# Patient Record
Sex: Female | Born: 1960
Health system: Southern US, Community
[De-identification: ages and names within clinical notes are randomized; demographics above are authoritative.]

## PROBLEM LIST (undated history)

## (undated) DIAGNOSIS — C50919 Malignant neoplasm of unspecified site of unspecified female breast: Secondary | ICD-10-CM

## (undated) DIAGNOSIS — T7840XA Allergy, unspecified, initial encounter: Secondary | ICD-10-CM

## (undated) DIAGNOSIS — D649 Anemia, unspecified: Secondary | ICD-10-CM

## (undated) DIAGNOSIS — Z8669 Personal history of other diseases of the nervous system and sense organs: Secondary | ICD-10-CM

## (undated) DIAGNOSIS — Z923 Personal history of irradiation: Secondary | ICD-10-CM

## (undated) HISTORY — PX: BREAST CYST ASPIRATION: SHX578

## (undated) HISTORY — PX: TUBAL LIGATION: SHX77

## (undated) HISTORY — PX: BREAST SURGERY: SHX581

## (undated) HISTORY — DX: Allergy, unspecified, initial encounter: T78.40XA

## (undated) HISTORY — PX: POLYPECTOMY: SHX149

## (undated) HISTORY — DX: Personal history of other diseases of the nervous system and sense organs: Z86.69

## (undated) HISTORY — PX: COLONOSCOPY: SHX174

## (undated) HISTORY — DX: Anemia, unspecified: D64.9

---

## 1997-09-09 ENCOUNTER — Emergency Department (HOSPITAL_COMMUNITY): Admission: EM | Admit: 1997-09-09 | Discharge: 1997-09-09 | Payer: Self-pay | Admitting: Emergency Medicine

## 1998-07-19 ENCOUNTER — Other Ambulatory Visit: Admission: RE | Admit: 1998-07-19 | Discharge: 1998-07-19 | Payer: Self-pay | Admitting: Obstetrics and Gynecology

## 1999-09-12 ENCOUNTER — Other Ambulatory Visit: Admission: RE | Admit: 1999-09-12 | Discharge: 1999-09-12 | Payer: Self-pay | Admitting: Gynecology

## 2000-03-11 ENCOUNTER — Other Ambulatory Visit: Admission: RE | Admit: 2000-03-11 | Discharge: 2000-03-11 | Payer: Self-pay | Admitting: Gastroenterology

## 2000-11-02 ENCOUNTER — Other Ambulatory Visit: Admission: RE | Admit: 2000-11-02 | Discharge: 2000-11-02 | Payer: Self-pay | Admitting: Obstetrics and Gynecology

## 2000-11-09 ENCOUNTER — Other Ambulatory Visit: Admission: RE | Admit: 2000-11-09 | Discharge: 2000-11-09 | Payer: Self-pay | Admitting: Obstetrics and Gynecology

## 2000-11-20 ENCOUNTER — Encounter: Admission: RE | Admit: 2000-11-20 | Discharge: 2000-11-20 | Payer: Self-pay | Admitting: Obstetrics and Gynecology

## 2000-11-20 ENCOUNTER — Encounter: Payer: Self-pay | Admitting: Obstetrics and Gynecology

## 2001-11-18 ENCOUNTER — Encounter: Payer: Self-pay | Admitting: Obstetrics and Gynecology

## 2001-11-18 ENCOUNTER — Encounter: Admission: RE | Admit: 2001-11-18 | Discharge: 2001-11-18 | Payer: Self-pay | Admitting: Obstetrics and Gynecology

## 2001-11-18 ENCOUNTER — Other Ambulatory Visit: Admission: RE | Admit: 2001-11-18 | Discharge: 2001-11-18 | Payer: Self-pay | Admitting: Radiology

## 2004-01-29 ENCOUNTER — Encounter: Admission: RE | Admit: 2004-01-29 | Discharge: 2004-01-29 | Payer: Self-pay | Admitting: Obstetrics and Gynecology

## 2004-12-09 ENCOUNTER — Other Ambulatory Visit: Admission: RE | Admit: 2004-12-09 | Discharge: 2004-12-09 | Payer: Self-pay | Admitting: Obstetrics and Gynecology

## 2005-02-12 ENCOUNTER — Encounter: Admission: RE | Admit: 2005-02-12 | Discharge: 2005-02-12 | Payer: Self-pay | Admitting: Obstetrics and Gynecology

## 2006-02-05 ENCOUNTER — Other Ambulatory Visit: Admission: RE | Admit: 2006-02-05 | Discharge: 2006-02-05 | Payer: Self-pay | Admitting: Obstetrics and Gynecology

## 2006-06-09 ENCOUNTER — Encounter: Admission: RE | Admit: 2006-06-09 | Discharge: 2006-06-09 | Payer: Self-pay | Admitting: Obstetrics and Gynecology

## 2006-06-18 ENCOUNTER — Encounter: Admission: RE | Admit: 2006-06-18 | Discharge: 2006-06-18 | Payer: Self-pay | Admitting: Emergency Medicine

## 2007-12-15 ENCOUNTER — Ambulatory Visit: Payer: Self-pay

## 2008-12-06 ENCOUNTER — Ambulatory Visit: Payer: Self-pay

## 2009-11-14 ENCOUNTER — Ambulatory Visit: Payer: Self-pay

## 2009-12-25 ENCOUNTER — Encounter (INDEPENDENT_AMBULATORY_CARE_PROVIDER_SITE_OTHER): Payer: Self-pay | Admitting: *Deleted

## 2010-04-14 HISTORY — PX: BREAST BIOPSY: SHX20

## 2010-05-14 NOTE — Letter (Signed)
Summary: Colonoscopy Date Change Letter  [facility]  [address] [place], [place] [address]   Phone: [phone]  Fax: [phone]      December 25, 2009 MRN: [id number]   [facility] [address] [place], [place]  [address]   Dear Ms. Geier,   Previously you were recommended to have a repeat colonoscopy around this time. Your chart was recently reviewed by Dr. Russella Dar of [facility]. Follow up colonoscopy is now recommended in September 2014. This revised recommendation is based on current, nationally recognized guidelines for colorectal cancer screening and polyp surveillance. These guidelines are endorsed by the American Cancer Society, [employer] on Colorectal Cancer as well as numerous other major medical organizations.  Please understand that our recommendation assumes that you do not have any new symptoms such as bleeding, a change in bowel habits, anemia, or significant abdominal discomfort. If you do have any concerning GI symptoms or want to discuss the guideline recommendations, please call to arrange an office visit at your earliest convenience. Otherwise we will keep you in our reminder system and contact you 1-2 months prior to the date listed above to schedule your next colonoscopy.  Thank you,   Judie Petit T. Russella Dar, M.D.  [facility] [phone]

## 2010-12-04 ENCOUNTER — Ambulatory Visit: Payer: Self-pay

## 2010-12-06 ENCOUNTER — Ambulatory Visit: Payer: Self-pay

## 2011-04-15 DIAGNOSIS — Z923 Personal history of irradiation: Secondary | ICD-10-CM

## 2011-04-15 DIAGNOSIS — C50919 Malignant neoplasm of unspecified site of unspecified female breast: Secondary | ICD-10-CM

## 2011-04-15 HISTORY — DX: Malignant neoplasm of unspecified site of unspecified female breast: C50.919

## 2011-04-15 HISTORY — PX: BREAST LUMPECTOMY: SHX2

## 2011-04-15 HISTORY — DX: Personal history of irradiation: Z92.3

## 2011-05-08 ENCOUNTER — Ambulatory Visit: Payer: Self-pay

## 2011-12-08 ENCOUNTER — Ambulatory Visit: Payer: Self-pay | Admitting: General Surgery

## 2011-12-10 ENCOUNTER — Ambulatory Visit: Payer: Self-pay | Admitting: General Surgery

## 2012-01-12 ENCOUNTER — Ambulatory Visit: Payer: Self-pay | Admitting: General Surgery

## 2012-01-12 HISTORY — PX: BREAST BIOPSY: SHX20

## 2012-01-14 LAB — PATHOLOGY REPORT

## 2012-01-19 ENCOUNTER — Ambulatory Visit: Payer: Self-pay | Admitting: General Surgery

## 2012-01-19 HISTORY — PX: BREAST EXCISIONAL BIOPSY: SUR124

## 2012-01-30 LAB — PATHOLOGY REPORT

## 2012-02-09 ENCOUNTER — Ambulatory Visit: Payer: Self-pay | Admitting: Radiation Oncology

## 2012-02-13 ENCOUNTER — Ambulatory Visit: Payer: Self-pay | Admitting: Radiation Oncology

## 2012-02-24 LAB — CBC CANCER CENTER
Eosinophil %: 4.1 %
HCT: 31.4 % — ABNORMAL LOW (ref 35.0–47.0)
Lymphocyte %: 28.5 %
MCV: 79 fL — ABNORMAL LOW (ref 80–100)
Monocyte %: 5 %
Neutrophil %: 61.3 %
Platelet: 368 x10 3/mm (ref 150–440)
RBC: 3.96 10*6/uL (ref 3.80–5.20)
RDW: 19.3 % — ABNORMAL HIGH (ref 11.5–14.5)
WBC: 7.9 x10 3/mm (ref 3.6–11.0)

## 2012-03-01 LAB — CBC CANCER CENTER
Basophil #: 0.1 x10 3/mm (ref 0.0–0.1)
Basophil %: 0.9 %
Eosinophil #: 0.2 x10 3/mm (ref 0.0–0.7)
HGB: 9.5 g/dL — ABNORMAL LOW (ref 12.0–16.0)
Lymphocyte #: 2.4 x10 3/mm (ref 1.0–3.6)
Lymphocyte %: 29.2 %
MCHC: 30.9 g/dL — ABNORMAL LOW (ref 32.0–36.0)
Monocyte #: 0.6 x10 3/mm (ref 0.2–0.9)
Neutrophil %: 60.6 %
Platelet: 317 x10 3/mm (ref 150–440)
RBC: 3.89 10*6/uL (ref 3.80–5.20)

## 2012-03-08 LAB — CBC CANCER CENTER
Eosinophil %: 2.9 %
HGB: 9.9 g/dL — ABNORMAL LOW (ref 12.0–16.0)
Lymphocyte %: 24.5 %
MCH: 24.7 pg — ABNORMAL LOW (ref 26.0–34.0)
MCHC: 31.8 g/dL — ABNORMAL LOW (ref 32.0–36.0)
MCV: 78 fL — ABNORMAL LOW (ref 80–100)
Monocyte #: 0.6 x10 3/mm (ref 0.2–0.9)
Monocyte %: 7.4 %
Neutrophil %: 64.3 %
Platelet: 285 x10 3/mm (ref 150–440)
WBC: 7.6 x10 3/mm (ref 3.6–11.0)

## 2012-03-14 ENCOUNTER — Ambulatory Visit: Payer: Self-pay | Admitting: Radiation Oncology

## 2012-03-15 LAB — CBC CANCER CENTER
Basophil %: 1.3 %
Eosinophil #: 0.2 x10 3/mm (ref 0.0–0.7)
Eosinophil %: 2.2 %
HCT: 31.7 % — ABNORMAL LOW (ref 35.0–47.0)
HGB: 10.1 g/dL — ABNORMAL LOW (ref 12.0–16.0)
Lymphocyte #: 1.8 x10 3/mm (ref 1.0–3.6)
Lymphocyte %: 26.2 %
MCH: 24.2 pg — ABNORMAL LOW (ref 26.0–34.0)
MCHC: 31.8 g/dL — ABNORMAL LOW (ref 32.0–36.0)
MCV: 76 fL — ABNORMAL LOW (ref 80–100)
Monocyte #: 0.5 x10 3/mm (ref 0.2–0.9)
Neutrophil %: 62.7 %
Platelet: 319 x10 3/mm (ref 150–440)
RBC: 4.16 10*6/uL (ref 3.80–5.20)
WBC: 7 x10 3/mm (ref 3.6–11.0)

## 2012-03-22 LAB — CBC CANCER CENTER
Basophil %: 1.2 %
Eosinophil %: 4.6 %
HCT: 31.2 % — ABNORMAL LOW (ref 35.0–47.0)
HGB: 10.2 g/dL — ABNORMAL LOW (ref 12.0–16.0)
Lymphocyte %: 25.4 %
MCH: 25 pg — ABNORMAL LOW (ref 26.0–34.0)
MCHC: 32.6 g/dL (ref 32.0–36.0)
Monocyte #: 0.5 x10 3/mm (ref 0.2–0.9)
Neutrophil #: 4 x10 3/mm (ref 1.4–6.5)
Neutrophil %: 60.9 %
RBC: 4.07 10*6/uL (ref 3.80–5.20)

## 2012-04-05 LAB — CBC CANCER CENTER
Basophil %: 1.3 %
Eosinophil #: 0.3 x10 3/mm (ref 0.0–0.7)
HCT: 31.1 % — ABNORMAL LOW (ref 35.0–47.0)
Lymphocyte %: 24.4 %
MCH: 24.5 pg — ABNORMAL LOW (ref 26.0–34.0)
Monocyte %: 8.2 %
Neutrophil #: 4.1 x10 3/mm (ref 1.4–6.5)
Neutrophil %: 62.2 %
Platelet: 289 x10 3/mm (ref 150–440)
RDW: 19.4 % — ABNORMAL HIGH (ref 11.5–14.5)
WBC: 6.5 x10 3/mm (ref 3.6–11.0)

## 2012-04-14 ENCOUNTER — Ambulatory Visit: Payer: Self-pay | Admitting: Radiation Oncology

## 2012-05-15 ENCOUNTER — Ambulatory Visit: Payer: Self-pay | Admitting: Radiation Oncology

## 2012-08-06 ENCOUNTER — Ambulatory Visit: Payer: Self-pay | Admitting: Oncology

## 2012-08-12 ENCOUNTER — Ambulatory Visit: Payer: Self-pay | Admitting: Oncology

## 2012-11-10 ENCOUNTER — Encounter: Payer: Self-pay | Admitting: Gastroenterology

## 2012-12-08 ENCOUNTER — Ambulatory Visit: Payer: Self-pay | Admitting: Oncology

## 2012-12-09 ENCOUNTER — Ambulatory Visit: Payer: Self-pay | Admitting: Oncology

## 2012-12-13 ENCOUNTER — Ambulatory Visit: Payer: Self-pay | Admitting: Oncology

## 2012-12-24 ENCOUNTER — Ambulatory Visit: Payer: Self-pay | Admitting: Oncology

## 2012-12-24 LAB — CBC CANCER CENTER
Basophil %: 0.9 %
Eosinophil %: 3.7 %
HGB: 9.9 g/dL — ABNORMAL LOW (ref 12.0–16.0)
Lymphocyte %: 30.3 %
MCH: 23.2 pg — ABNORMAL LOW (ref 26.0–34.0)
MCV: 73 fL — ABNORMAL LOW (ref 80–100)
Monocyte #: 0.4 x10 3/mm (ref 0.2–0.9)
Monocyte %: 6.4 %
Neutrophil #: 3.8 x10 3/mm (ref 1.4–6.5)
Neutrophil %: 58.7 %
Platelet: 299 x10 3/mm (ref 150–440)
RBC: 4.24 10*6/uL (ref 3.80–5.20)
RDW: 21.3 % — ABNORMAL HIGH (ref 11.5–14.5)
WBC: 6.5 x10 3/mm (ref 3.6–11.0)

## 2012-12-24 LAB — FERRITIN: Ferritin (ARMC): 3 ng/mL — ABNORMAL LOW (ref 8–388)

## 2012-12-24 LAB — IRON AND TIBC
Iron Bind.Cap.(Total): 430 ug/dL (ref 250–450)
Unbound Iron-Bind.Cap.: 419 ug/dL

## 2013-01-07 LAB — CBC CANCER CENTER
Basophil #: 0.1 x10 3/mm (ref 0.0–0.1)
Basophil %: 0.8 %
Eosinophil #: 0.3 x10 3/mm (ref 0.0–0.7)
Eosinophil %: 3.5 %
HCT: 36.9 % (ref 35.0–47.0)
HGB: 11.6 g/dL — ABNORMAL LOW (ref 12.0–16.0)
Lymphocyte %: 26.5 %
MCH: 25.2 pg — ABNORMAL LOW (ref 26.0–34.0)
MCV: 80 fL (ref 80–100)
RBC: 4.62 10*6/uL (ref 3.80–5.20)
RDW: 29.4 % — ABNORMAL HIGH (ref 11.5–14.5)

## 2013-01-07 LAB — IRON AND TIBC
Iron Bind.Cap.(Total): 310 ug/dL (ref 250–450)
Iron Saturation: 30 %

## 2013-01-07 LAB — FERRITIN: Ferritin (ARMC): 310 ng/mL (ref 8–388)

## 2013-01-12 ENCOUNTER — Ambulatory Visit: Payer: Self-pay | Admitting: Oncology

## 2013-04-04 ENCOUNTER — Ambulatory Visit: Payer: Self-pay | Admitting: Oncology

## 2013-04-04 LAB — IRON AND TIBC
Iron Bind.Cap.(Total): 314 ug/dL (ref 250–450)
Iron: 117 ug/dL (ref 50–170)

## 2013-04-04 LAB — CBC CANCER CENTER
Basophil #: 0.1 x10 3/mm (ref 0.0–0.1)
Basophil %: 1.1 %
Eosinophil #: 0.2 x10 3/mm (ref 0.0–0.7)
HCT: 42.7 % (ref 35.0–47.0)
HGB: 14.3 g/dL (ref 12.0–16.0)
Lymphocyte #: 1.4 x10 3/mm (ref 1.0–3.6)
MCH: 31.1 pg (ref 26.0–34.0)
MCV: 93 fL (ref 80–100)
Neutrophil #: 4.6 x10 3/mm (ref 1.4–6.5)
RDW: 13.9 % (ref 11.5–14.5)
WBC: 6.5 x10 3/mm (ref 3.6–11.0)

## 2013-04-04 LAB — FERRITIN: Ferritin (ARMC): 23 ng/mL (ref 8–388)

## 2013-04-14 ENCOUNTER — Ambulatory Visit: Payer: Self-pay | Admitting: Oncology

## 2013-07-01 ENCOUNTER — Encounter: Payer: Self-pay | Admitting: Obstetrics & Gynecology

## 2013-07-04 ENCOUNTER — Ambulatory Visit: Payer: Self-pay | Admitting: Oncology

## 2013-07-04 LAB — CBC CANCER CENTER
Basophil #: 0.1 x10 3/mm (ref 0.0–0.1)
Basophil %: 0.8 %
EOS ABS: 0.3 x10 3/mm (ref 0.0–0.7)
Eosinophil %: 3.8 %
HCT: 41.5 % (ref 35.0–47.0)
HGB: 13.6 g/dL (ref 12.0–16.0)
Lymphocyte #: 2.1 x10 3/mm (ref 1.0–3.6)
Lymphocyte %: 30.3 %
MCH: 31.2 pg (ref 26.0–34.0)
MCHC: 32.8 g/dL (ref 32.0–36.0)
MCV: 95 fL (ref 80–100)
Monocyte #: 0.3 x10 3/mm (ref 0.2–0.9)
Monocyte %: 4.7 %
NEUTROS ABS: 4.2 x10 3/mm (ref 1.4–6.5)
Neutrophil %: 60.4 %
PLATELETS: 238 x10 3/mm (ref 150–440)
RBC: 4.36 10*6/uL (ref 3.80–5.20)
RDW: 13.3 % (ref 11.5–14.5)
WBC: 6.9 x10 3/mm (ref 3.6–11.0)

## 2013-07-04 LAB — IRON AND TIBC
Iron Bind.Cap.(Total): 316 ug/dL (ref 250–450)
Iron Saturation: 21 %
Iron: 67 ug/dL (ref 50–170)
Unbound Iron-Bind.Cap.: 249 ug/dL

## 2013-07-04 LAB — FERRITIN: Ferritin (ARMC): 14 ng/mL (ref 8–388)

## 2013-07-13 ENCOUNTER — Ambulatory Visit: Payer: Self-pay | Admitting: Oncology

## 2013-07-13 ENCOUNTER — Encounter: Payer: Self-pay | Admitting: Obstetrics & Gynecology

## 2013-08-12 ENCOUNTER — Encounter: Payer: Self-pay | Admitting: Obstetrics & Gynecology

## 2013-11-27 ENCOUNTER — Ambulatory Visit (INDEPENDENT_AMBULATORY_CARE_PROVIDER_SITE_OTHER): Payer: BC Managed Care – PPO | Admitting: Family Medicine

## 2013-11-27 VITALS — BP 120/70 | HR 85 | Temp 98.5°F | Resp 18 | Ht 62.0 in | Wt 141.4 lb

## 2013-11-27 DIAGNOSIS — R195 Other fecal abnormalities: Secondary | ICD-10-CM

## 2013-11-27 DIAGNOSIS — N950 Postmenopausal bleeding: Secondary | ICD-10-CM

## 2013-11-27 DIAGNOSIS — C50919 Malignant neoplasm of unspecified site of unspecified female breast: Secondary | ICD-10-CM

## 2013-11-27 DIAGNOSIS — Z79810 Long term (current) use of selective estrogen receptor modulators (SERMs): Secondary | ICD-10-CM

## 2013-11-27 LAB — POCT CBC
GRANULOCYTE PERCENT: 74.9 % (ref 37–80)
HCT, POC: 42.8 % (ref 37.7–47.9)
Hemoglobin: 13.9 g/dL (ref 12.2–16.2)
Lymph, poc: 2.4 (ref 0.6–3.4)
MCH, POC: 30.8 pg (ref 27–31.2)
MCHC: 32.4 g/dL (ref 31.8–35.4)
MCV: 95.1 fL (ref 80–97)
MID (CBC): 0.4 (ref 0–0.9)
MPV: 7.1 fL (ref 0–99.8)
POC GRANULOCYTE: 8.4 — AB (ref 2–6.9)
POC LYMPH PERCENT: 21.5 %L (ref 10–50)
POC MID %: 3.6 %M (ref 0–12)
Platelet Count, POC: 246 10*3/uL (ref 142–424)
RBC: 4.5 M/uL (ref 4.04–5.48)
RDW, POC: 14.1 %
WBC: 11.2 10*3/uL — AB (ref 4.6–10.2)

## 2013-11-27 LAB — POCT URINE PREGNANCY: Preg Test, Ur: NEGATIVE

## 2013-11-27 LAB — IFOBT (OCCULT BLOOD): IFOBT: POSITIVE

## 2013-11-27 MED ORDER — MEGESTROL ACETATE 40 MG PO TABS: 40.0000 mg | ORAL_TABLET | Freq: Every day | ORAL | Status: DC

## 2013-11-27 NOTE — Progress Notes (Addendum)
Subjective:   This chart was scribed for Linda Honour, MD by Forrestine Him, [facility] Scribe. This patient was seen in room 13 and the patient's care was started 2:02 PM.    Patient ID: Linda Tran, female    DOB: [birth year], 53 y.o.   MRN: [id number]  11/27/2013  Menorrhagia   HPI  HPI Comments: Linda Tran is a 53 y.o. female with a PMHx of breast cancer who presents to [facility] complaining of constant, severe vaginal bleeding x 2 days that is unchanged. No other bleeding from other areas noted. Pt has been using large pads and is soaking through after 1 hour. Pt has had no menstraul periods for 1 year and had her Mirena removed 2 years ago. Pt was started on Tamoxifen and has been taking this medication for about 2 years now. Last pap smear 2014. She admits to a history of an abnormal pap several years ago. Last normal bowel movement yesterday. At this time she denies any fever, SOB, chest pain, or chills.   She has a PSHx of breast surgery and tubal ligation.  Mother passed of natural causes in her late 55's. Father died when he was 26 of a heart attack. Brothers are healthy without any medical problems.   She is single and not currently dating. Pt has a son the age of 67. No grandchildren at this time. Pt works at [employer] as a Development worker, international aid".  She is followed by Dr. Darrold Span who plans to retire soon.  Breast cancer:  [name] is Education officer, environmental in [place]; oncologist at [facility].  Diagnosis in September 2013.  IUD removed by gynecology [place] [facility].    Review of Systems  Constitutional: Negative for fever and chills.  Respiratory: Negative for shortness of breath.   Cardiovascular: Negative for chest pain.  Gastrointestinal: Negative for nausea, vomiting, abdominal pain, diarrhea, constipation and blood in stool.  Genitourinary: Positive for vaginal bleeding and menstrual problem. Negative for dysuria, frequency,  hematuria, flank pain, vaginal discharge, genital sores, vaginal pain and pelvic pain.  Skin: Negative for rash.  Neurological: Negative for dizziness and light-headedness.  Psychiatric/Behavioral: Negative for confusion.    Past Medical History  Diagnosis Date   Allergy    Cancer    Past Surgical History  Procedure Laterality Date   Breast surgery     Cesarean section     Tubal ligation     No Known Allergies Current Outpatient Prescriptions  Medication Sig Dispense Refill   megestrol (MEGACE) 40 MG tablet Take 1 tablet (40 mg total) by mouth daily.  7 tablet  0   tamoxifen (NOLVADEX) 10 MG tablet Take 10 mg by mouth daily.       No current facility-administered medications for this visit.       Objective:    BP 120/70   Pulse 85   Temp(Src) 98.5 F (36.9 C) (Oral)   Resp 18   Ht 5\' 2"  (1.575 m)   Wt 141 lb 6.4 oz (64.139 kg)   BMI 25.86 kg/m2   SpO2 96% Physical Exam  Nursing note and vitals reviewed. Constitutional: She is oriented to person, place, and time. She appears well-developed and well-nourished. No distress.  HENT:  Head: Normocephalic and atraumatic.  Eyes: Conjunctivae and EOM are normal. Pupils are equal, round, and reactive to light.  Neck: Normal range of motion. Neck supple. Carotid bruit is not present. No thyromegaly present.  Cardiovascular: Normal rate, regular rhythm, normal heart sounds and intact distal pulses.  Exam reveals no gallop and no friction rub.   No murmur heard. Pulmonary/Chest: Effort normal and breath sounds normal. She has no wheezes. She has no rales.  Abdominal: Soft. Bowel sounds are normal. She exhibits no distension and no mass. There is no tenderness. There is no rebound and no guarding.  Genitourinary: Rectum normal and uterus normal. There is no rash, tenderness, lesion or injury on the right labia. There is no rash, tenderness, lesion or injury on the left labia. Uterus is not tender. Cervix exhibits no motion  tenderness, no discharge and no friability. Right adnexum displays no mass, no tenderness and no fullness. Left adnexum displays no mass, no tenderness and no fullness. No erythema, tenderness or bleeding around the vagina. No foreign body around the vagina. No vaginal discharge found.  Large amount of blood noted in vaginal vault coming from cervical os  No lesions or abrasions noted  Musculoskeletal: Normal range of motion.  Lymphadenopathy:    She has no cervical adenopathy.  Neurological: She is alert and oriented to person, place, and time. No cranial nerve deficit.  Skin: Skin is warm and dry. No rash noted. She is not diaphoretic. No erythema. No pallor.  Psychiatric: She has a normal mood and affect. Her behavior is normal. Judgment normal.   Results for orders placed in visit on 11/27/13  GC/CHLAMYDIA PROBE AMP      Result Value Ref Range   CT Probe RNA NEGATIVE     GC Probe RNA NEGATIVE    FOLLICLE STIMULATING HORMONE      Result Value Ref Range   FSH 12.3    LUTEINIZING HORMONE      Result Value Ref Range   LH 8.9    POCT CBC      Result Value Ref Range   WBC 11.2 (*) 4.6 - 10.2 K/uL   Lymph, poc 2.4  0.6 - 3.4   POC LYMPH PERCENT 21.5  10 - 50 %L   MID (cbc) 0.4  0 - 0.9   POC MID % 3.6  0 - 12 %M   POC Granulocyte 8.4 (*) 2 - 6.9   Granulocyte percent 74.9  37 - 80 %G   RBC 4.50  4.04 - 5.48 M/uL   Hemoglobin 13.9  12.2 - 16.2 g/dL   HCT, POC 42.8  37.7 - 47.9 %   MCV 95.1  80 - 97 fL   MCH, POC 30.8  27 - 31.2 pg   MCHC 32.4  31.8 - 35.4 g/dL   RDW, POC 14.1     Platelet Count, POC 246  142 - 424 K/uL   MPV 7.1  0 - 99.8 fL  POCT URINE PREGNANCY      Result Value Ref Range   Preg Test, Ur Negative    IFOBT (OCCULT BLOOD)      Result Value Ref Range   IFOBT Positive         Assessment & Plan:   1. Postmenopausal vaginal bleeding   2. Use of tamoxifen (Nolvadex)   3. Breast cancer, unspecified laterality   4. Stool guaiac positive    1.  Post-menopausal bleeding on Tamoxifen:  New.  Stable H/H.  Spoke with gynecology/[facility] who recommended starting Megace 40mg  daily; schedule appointment with Gynecology Clinic this week.   2. Breast cancer: diagnosed in 2013; maintained on Tamoxifen. 3. Stool guaiac positive: New.  Possible contaminate from vaginal bleeding; however, due for coloscopy; pt advised of results and will plan to schedule colonoscopy after addressing postmenopausal bleeding.  Meds ordered this encounter  Medications   tamoxifen (NOLVADEX) 10 MG tablet    Sig: Take 10 mg by mouth daily.   megestrol (MEGACE) 40 MG tablet    Sig: Take 1 tablet (40 mg total) by mouth daily.    Dispense:  7 tablet    Refill:  0    No Follow-up on file.    I personally performed the services described in this documentation, which was scribed in my presence. The recorded information has been reviewed and is accurate.   Reginia Forts, M.D.  [facility] [address] [place], [place]  [address] [phone] phone [phone] fax

## 2013-11-28 ENCOUNTER — Telehealth: Payer: Self-pay

## 2013-11-28 LAB — FOLLICLE STIMULATING HORMONE: FSH: 12.3 m[IU]/mL

## 2013-11-28 LAB — LUTEINIZING HORMONE: LH: 8.9 m[IU]/mL

## 2013-11-28 NOTE — Telephone Encounter (Signed)
Pt is needing to talk with dr Tamala Julian about work since her appt is not until 12/01/13 at 66 with dr dove

## 2013-11-28 NOTE — Telephone Encounter (Signed)
Do you want her to stayout until Thursday 12/01/2013. Ok to leave detailed message on machine.  Supervisor fax # [phone] Mortimer Fries

## 2013-11-29 LAB — GC/CHLAMYDIA PROBE AMP
CT Probe RNA: NEGATIVE
GC Probe RNA: NEGATIVE

## 2013-11-29 NOTE — Telephone Encounter (Signed)
OK to extend work note until 12/01/13.  How is patient's bleeding? Has it slowed down at all?

## 2013-11-29 NOTE — Telephone Encounter (Signed)
Left detailed message on pts answering machine. Faxed [facility] note to number provided.

## 2013-12-01 ENCOUNTER — Ambulatory Visit (INDEPENDENT_AMBULATORY_CARE_PROVIDER_SITE_OTHER): Payer: BC Managed Care – PPO | Admitting: Obstetrics & Gynecology

## 2013-12-01 ENCOUNTER — Encounter: Payer: Self-pay | Admitting: Obstetrics & Gynecology

## 2013-12-01 VITALS — BP 115/71 | HR 80 | Ht 62.0 in | Wt 141.0 lb

## 2013-12-01 DIAGNOSIS — Z79810 Long term (current) use of selective estrogen receptor modulators (SERMs): Secondary | ICD-10-CM

## 2013-12-01 DIAGNOSIS — N949 Unspecified condition associated with female genital organs and menstrual cycle: Secondary | ICD-10-CM

## 2013-12-01 DIAGNOSIS — Z01812 Encounter for preprocedural laboratory examination: Secondary | ICD-10-CM

## 2013-12-01 DIAGNOSIS — N925 Other specified irregular menstruation: Secondary | ICD-10-CM

## 2013-12-01 DIAGNOSIS — N938 Other specified abnormal uterine and vaginal bleeding: Secondary | ICD-10-CM

## 2013-12-01 LAB — POCT URINE PREGNANCY: PREG TEST UR: NEGATIVE

## 2013-12-01 NOTE — Progress Notes (Signed)
   Subjective:    Patient ID: Linda Tran, female    DOB: 04/09/1961, 53 y.o.   MRN: [id number]  HPI 53 yo lady who is here for irregular bleeding. She was given a Mirena IUD at age 53 for DUB and her periods stopped. She has had a BTL in the distant past. She was then diagnosed with breast cancer at age 53 and the Mirena was removed at that time. She started bleeding about a month ago and was given megace which has decreased the bleeding. Please note that she has been on tamoxifen since the breast cancer diagnosis 2 years ago.   Review of Systems Interestingly, her bladder incontinence issues resolved when she started taking the megace.    Objective:   Physical Exam   UPT negative, consent signed, time out done Cervix prepped with betadine and grasped with a single tooth tenaculum Uterus sounded to 9 cm Pipelle used for 2 passes with a large amount of tissue obtained. She tolerated the procedure well.        Assessment & Plan:  Irregular bleeding with tamoxiffen I will await the [facility] results from today Schedule gyn u/s

## 2013-12-08 ENCOUNTER — Telehealth: Payer: Self-pay | Admitting: *Deleted

## 2013-12-08 NOTE — Telephone Encounter (Signed)
Returned patients call regarding her test results.  Left message that results looked normal and if she would like more details she can call the office back.

## 2013-12-09 ENCOUNTER — Ambulatory Visit: Payer: Self-pay | Admitting: Oncology

## 2013-12-15 ENCOUNTER — Ambulatory Visit (HOSPITAL_COMMUNITY)
Admission: RE | Admit: 2013-12-15 | Discharge: 2013-12-15 | Disposition: A | Discharge status: Home / Self Care | Payer: BC Managed Care – PPO | Source: Ambulatory Visit | Attending: Obstetrics & Gynecology | Admitting: Obstetrics & Gynecology

## 2013-12-15 ENCOUNTER — Other Ambulatory Visit: Payer: Self-pay | Admitting: *Deleted

## 2013-12-15 ENCOUNTER — Encounter: Payer: Self-pay | Admitting: *Deleted

## 2013-12-15 DIAGNOSIS — N938 Other specified abnormal uterine and vaginal bleeding: Secondary | ICD-10-CM | POA: Diagnosis present

## 2013-12-15 DIAGNOSIS — N949 Unspecified condition associated with female genital organs and menstrual cycle: Secondary | ICD-10-CM | POA: Diagnosis present

## 2013-12-15 DIAGNOSIS — D251 Intramural leiomyoma of uterus: Secondary | ICD-10-CM | POA: Insufficient documentation

## 2013-12-15 DIAGNOSIS — N950 Postmenopausal bleeding: Secondary | ICD-10-CM

## 2013-12-15 DIAGNOSIS — N925 Other specified irregular menstruation: Secondary | ICD-10-CM | POA: Diagnosis not present

## 2013-12-15 DIAGNOSIS — Z79810 Long term (current) use of selective estrogen receptor modulators (SERMs): Secondary | ICD-10-CM

## 2013-12-15 MED ORDER — MEGESTROL ACETATE 20 MG PO TABS: 20.0000 mg | ORAL_TABLET | Freq: Every day | ORAL | Status: DC

## 2013-12-15 NOTE — Telephone Encounter (Signed)
Patient is still bleeding, ok per Dr. Hulan Fray to call in Megace 20mg  every day for one month.  Patient will keep follow up appointment next week to discuss plan.

## 2013-12-22 ENCOUNTER — Ambulatory Visit (INDEPENDENT_AMBULATORY_CARE_PROVIDER_SITE_OTHER): Payer: BC Managed Care – PPO | Admitting: Obstetrics & Gynecology

## 2013-12-22 ENCOUNTER — Encounter: Payer: Self-pay | Admitting: Obstetrics & Gynecology

## 2013-12-22 VITALS — BP 111/61 | HR 83 | Ht 62.0 in | Wt 140.2 lb

## 2013-12-22 DIAGNOSIS — N949 Unspecified condition associated with female genital organs and menstrual cycle: Secondary | ICD-10-CM | POA: Diagnosis not present

## 2013-12-22 DIAGNOSIS — N938 Other specified abnormal uterine and vaginal bleeding: Secondary | ICD-10-CM | POA: Diagnosis not present

## 2013-12-22 DIAGNOSIS — N925 Other specified irregular menstruation: Secondary | ICD-10-CM | POA: Diagnosis not present

## 2013-12-22 NOTE — Progress Notes (Signed)
   Subjective:    Patient ID: Linda Tran, female    DOB: [birth year], 53 y.o.   MRN: [id number]  HPI  53 yo lady with a h/o breast cancer on tamoxifem is here for follow up after her embx and gyn u/s. Her embx showed negative pathology. Her endometrium was 4 mm with adenomyosis and a small fibroid.  Review of Systems     Objective:   Physical Exam I discussed all the findings and gave her reassurance. She spoke with her oncologist recently but felt like he was "blowing me off". She did not feel satisfied with his answers about her disease and treatment.       Assessment & Plan:  DUB- I have reassured her that her endometrium is fine at this point. I have agreed to another u/s in 6 months to follow her fibroid as she is concerned that it may be malignant. I mentioned that she can always get a second opinion about any of her treatments. She can stop the megace.

## 2014-01-17 ENCOUNTER — Ambulatory Visit: Payer: Self-pay | Admitting: Oncology

## 2014-02-13 ENCOUNTER — Encounter: Payer: Self-pay | Admitting: Obstetrics & Gynecology

## 2014-05-31 LAB — TSH: TSH: 0.79 (ref 0.41–5.90)

## 2014-06-01 LAB — LIPID PANEL
CHOLESTEROL: 206 — AB (ref 0–200)
HDL: 50 (ref 35–70)
LDL Cholesterol: 96
Triglycerides: 298 — AB (ref 40–160)

## 2014-06-06 ENCOUNTER — Encounter: Payer: Self-pay | Admitting: Gastroenterology

## 2014-06-21 ENCOUNTER — Ambulatory Visit (HOSPITAL_COMMUNITY): Payer: BC Managed Care – PPO

## 2014-08-01 NOTE — Op Note (Signed)
PATIENT NAME:  Linda Tran, Linda Tran MR#:  [id number] DATE OF BIRTH:  05/01/1960  DATE OF PROCEDURE:  01/19/2012  PREOPERATIVE DIAGNOSIS: DCIS, left breast.   POSTOPERATIVE DIAGNOSIS: DCIS, left breast.   OPERATION:  1. Lumpectomy, left breast. 2. Sentinel node biopsy. 3. Cyst aspiration, left breast.   PREOPERATIVE PROCEDURES:  1. Wire localization of clip in the area of previous biopsy in the upper outer aspect left breast. 2. Injection of nuclear contrast for sentinel node identification.   INTRAOPERATIVE PROCEDURE: Ultrasound guidance for aspiration of a large cyst in the upper central breast.   SURGEON: Mckinley Jewel, MD   ANESTHESIA: General.   COMPLICATIONS: None.   ESTIMATED BLOOD LOSS: Approximately 100 mL.   DRAINS: None.   PROCEDURE: The patient was put to sleep in the supine position on the operating table and LMA was utilized and subsequently about 15 mL of 0.5% Marcaine was used in total in the breast and axillary incisions. The left breast was prepped and draped out as a sterile field. The patient had significant signal activity located in the anterior/inferior portion of the axilla. A small skin incision was made overlying this region and carefully deepened through into the axillary fat pad and two sizable nodes about a centimeter in size each was identified both with very high signal activity. These were removed and sent off as sentinel node one and two for frozen section. Following removal of these two nodes, the activity died down completely in the axilla and no other palpable or visible nodes were noted. After confirmation of no macro metastases in these lymph nodes, the axillary wound was subsequently closed with 2-0 Vicryl in the deep tissue and the skin with subcuticular 4-0 Vicryl. The wire placement was done from the lateral aspect going towards the upper outer quadrant area and with ultrasound guidance it was noted there was a large firm area in the central portion of  the breast occupying a large portion just above the areola. This was a large cyst and with ultrasound guidance a 22-gauge needle was employed and the cyst was aspirated. 7 mL of slightly turbid yellow fluid was removed and the cyst resolved fully and firmness subsided. Further query with the ultrasound showed multiple small cysts and was really hard to tell where the previous biopsy cavity was but the location of the wire indicated the presence of the location of the cavity. This area was marked on the skin. A curved incision was made in the upper outer quadrant overlying this marked area just a little medial to the wire entrance. Skin and subcutaneous tissue was elevated on both sides. The wire was then freed from the skin and using the wire as a guide it was followed along the breast tissue circumferentially containing the biopsy cavity. In the deeper portion since the calcifications that were biopsied were close to the muscular layer, the pectoral fascia overlying the muscle was taken along with the specimen. The excised tissue was then tagged for margins and sent to pathology after verification of clip with a specimen mammogram. There was a moderate amount of bleeding from the raw surfaces which was controlled with cautery and suture ligature of 3-0 Vicryl. After ensuring hemostasis, the wound was irrigated and closed in layers. 2-0 Vicryl was used in the deeper layers and the subcutaneous tissue also with 2-0 Vicryl and the skin closed with subcuticular 4-0 Vicryl. Dermabond was applied to the skin of both incisions. The patient subsequently was returned to the recovery room  in stable condition.   ____________________________ S.Robinette Haines, MD sgs:drc D: 01/19/2012 15:02:50 ET T: 01/19/2012 15:21:56 ET JOB#: [id number]  cc: S.G. [name], MD, <Dictator> [facility] Robinette Haines MD ELECTRONICALLY SIGNED 01/21/2012 9:28

## 2014-11-22 ENCOUNTER — Encounter: Payer: Self-pay | Admitting: Gastroenterology

## 2015-04-15 HISTORY — PX: BREAST BIOPSY: SHX20

## 2015-05-02 ENCOUNTER — Other Ambulatory Visit: Payer: Self-pay | Admitting: Obstetrics & Gynecology

## 2015-05-02 DIAGNOSIS — Z853 Personal history of malignant neoplasm of breast: Secondary | ICD-10-CM

## 2015-05-21 ENCOUNTER — Ambulatory Visit
Admission: RE | Admit: 2015-05-21 | Discharge: 2015-05-21 | Disposition: A | Discharge status: Home / Self Care | Payer: BLUE CROSS/BLUE SHIELD | Source: Ambulatory Visit | Attending: Obstetrics & Gynecology | Admitting: Obstetrics & Gynecology

## 2015-05-21 ENCOUNTER — Other Ambulatory Visit: Payer: Self-pay | Admitting: Obstetrics & Gynecology

## 2015-05-21 DIAGNOSIS — Z853 Personal history of malignant neoplasm of breast: Secondary | ICD-10-CM

## 2015-05-21 DIAGNOSIS — N6001 Solitary cyst of right breast: Secondary | ICD-10-CM | POA: Diagnosis not present

## 2015-05-21 DIAGNOSIS — N63 Unspecified lump in breast: Secondary | ICD-10-CM | POA: Diagnosis not present

## 2015-05-21 HISTORY — DX: Malignant neoplasm of unspecified site of unspecified female breast: C50.919

## 2015-05-22 ENCOUNTER — Telehealth: Payer: Self-pay | Admitting: *Deleted

## 2015-05-22 NOTE — Telephone Encounter (Signed)
Talked with Deneice in the breast center in regards to the patient to discuss possible use of [place] to assist with paying for a breast MRI if the patient does not opt for biopsy at this time.  Called and talked with patient.  Reviewed results of her mammogram and answered questions.  The patient had had a long conversation yesterday with the radiologist.  At this time the patient states she would prefer to move forward with a biopsy.  Offered opportunity for her to discuss any options with her provider first, and she is sure she wants to proceed with biopsy.  Notified Deneice in the breast center.  She will request an order from her provider and get the patient scheduled.  The patient is to call if she has any further questions or needs.

## 2015-05-23 LAB — HM PAP SMEAR: HM Pap smear: NEGATIVE

## 2015-05-24 ENCOUNTER — Other Ambulatory Visit: Payer: Self-pay | Admitting: Obstetrics & Gynecology

## 2015-05-24 DIAGNOSIS — N63 Unspecified lump in unspecified breast: Secondary | ICD-10-CM

## 2015-05-29 ENCOUNTER — Ambulatory Visit
Admission: RE | Admit: 2015-05-29 | Discharge: 2015-05-29 | Disposition: A | Discharge status: Home / Self Care | Payer: BLUE CROSS/BLUE SHIELD | Source: Ambulatory Visit | Attending: Obstetrics & Gynecology | Admitting: Obstetrics & Gynecology

## 2015-05-29 DIAGNOSIS — N6091 Unspecified benign mammary dysplasia of right breast: Secondary | ICD-10-CM | POA: Insufficient documentation

## 2015-05-29 DIAGNOSIS — N63 Unspecified lump in unspecified breast: Secondary | ICD-10-CM

## 2015-05-29 DIAGNOSIS — D241 Benign neoplasm of right breast: Secondary | ICD-10-CM | POA: Insufficient documentation

## 2015-05-30 LAB — SURGICAL PATHOLOGY

## 2015-06-19 ENCOUNTER — Ambulatory Visit: Payer: Self-pay | Admitting: Surgery

## 2015-06-19 DIAGNOSIS — D241 Benign neoplasm of right breast: Secondary | ICD-10-CM

## 2015-06-19 NOTE — H&P (Signed)
Linda Tran 06/19/2015 3:43 PM Location: [facility] Patient #: [id number] DOB: 1960/07/21 Undefined / Language: Linda Tran / Race: White Female  History of Present Illness Linda Moores A. Deshannon Seide MD; 06/19/2015 4:24 PM) Patient words: Patient sent at the request of Dr.Jarosz for mammographic abnormality right breast. She was found to have a papilloma on core biopsy. Patient has history of left breast DCIS treated in 2013 and lumpectomy and radiation therapy. She stopped taking tamoxifen. She denies any history of breast pain, breast mass or nipple discharge. Left breast is smaller than right breast.             CLINICAL DATA: History of treated left breast cancer, status post lumpectomy and radiation therapy in 2013. EXAM: DIGITAL DIAGNOSTIC BILATERAL MAMMOGRAM WITH 3D TOMOSYNTHESIS WITH CAD ULTRASOUND RIGHT BREAST COMPARISON: Previous exam(s). ACR Breast Density Category c: The breast tissue is heterogeneously dense, which may obscure small masses. FINDINGS: There are no suspicious masses, areas of nonsurgical architectural distortion or microcalcifications in the left breast. Post lumpectomy changes in the left breast upper outer quadrant, posterior depth, are stable. In the right breast, there are several low-density circumscribed masses, the largest of which is located at approximately 9 o'clock, anterior to middle depth. Mammographic images were processed with CAD. On physical exam, no suspicious masses are found. Targeted ultrasound is performed, showing right breast 8 o'clock 1 cm from the nipple benign-appearing cyst measuring 3.1 by 1.5 by 2.4 cm. A probable complicated cyst is seen in the right retroareolar breast measuring 9 mm as well. Additionally, there are several hypoechoic circumscribed nodules. Mainly, there is a right breast 9:30 o'clock 2 cm from the nipple 0.8 x 0.8 x 0.9 cm such mass, right breast 9:30 o'clock 1 cm from the nipple 0.6 by 0.5 by 0.6  such mass. Two other similar in appearance smaller nodules measuring 6 and 4 mm in greatest dimension are also noted in the right breast 9:30-10 o'clock 2 cm from the nipple. IMPRESSION: No mammographic evidence of malignancy in the left breast, status post left lumpectomy. Several hypoechoic circumscribed masses in the right breast 9:30 and 10 o'clock, with predominantly benign appearance which however do not satisfy the criteria for simple cysts. Given their multiplicity and unilaterality, MRI of the breast may be considered for further evaluation. If MRI is not performed, ultrasound-guided cyst aspiration versus core needle biopsy of the 9:30 o'clock mass, 2 cm from the nipple, may be considered, with follow-up of the remainder of the masses, assuming benign pathology results. RECOMMENDATION: Contrast-enhanced breast MRI. I have discussed the findings and recommendations with the patient. Results were also provided in writing at the conclusion of the visit. If applicable, a reminder letter will be sent to the patient regarding the next appointment. BI-RADS CATEGORY 4: Suspicious. Electronically Signed By: Linda Tran M.D. On: 05/21/2015 16:28   Scans Related to Order [id number] Scan on 05/02/2015 9:43 AM by Sherrie Sport : DIAG MAMMO <[email or web address] on 05/02/2015 9:43 AM by Sherrie Sport : DIAG MAMMO  Result History MM DIAG BREAST TOMO BILATERAL (Order #[id number]) on 05/21/2015 - Order Result History Report <[email or web address]>   [place] RT BREAST BX W LOC DEV 1ST LESION IMG BX SPEC US GUIDE (Order [id number]) - Reflex for Order [id number]   Addendum Everlean Alstrom, MD Fri Jun 01, 2015 11:50:46 AM EST  ADDENDUM REPORT: 06/01/2015 11:48 ADDENDUM: Pathology results: Pathology results from the ultrasound-guided biopsy of the mass in the right breast at the 9:30 position revealed cyst wall fragments, 2  mm intraductal papilloma, usual ductal  hyperplasia. This is concordant with the imaging findings. The patient has been notified of the results. She is doing well and denies any biopsy site complications. Excision is recommended. The patient has been scheduled to see Dr. Brantley Stage 06/04/2015 at 1:20 p.m. The patient has been instructed to call the [facility] with any questions or concerns. Electronically Signed By: Everlean Alstrom M.D. On: 06/01/2015 11:48  Study Result CLINICAL DATA: 55 year old female with a mass in the right breast at the 9:30 position. EXAM: ULTRASOUND GUIDED RIGHT BREAST CORE NEEDLE BIOPSY COMPARISON: Previous exam(s). PROCEDURE: I met with the patient and we discussed the procedure of ultrasound-guided biopsy, including benefits and alternatives. We discussed the high likelihood of a successful procedure. We discussed the risks of the procedure including infection, bleeding, tissue injury, clip migration, and inadequate sampling. Informed written consent was given. The usual time-out protocol was performed immediately prior to the procedure. Using sterile technique and 1% Lidocaine as local anesthetic, under direct ultrasound visualization, a 12 gauge vacuum-assisted device was used to perform biopsy of the mass in the right breast at the 9:30 positionusing a lateral to medial approach. At the conclusion of the procedure, a wing shaped tissue marker clip was deployed into the biopsy cavity. Follow-up 2-view mammogram was performed and dictated separately. IMPRESSION: Ultrasound-guided biopsy of the mass in the right breast at the 9:30 position. No apparent complications. Electronically Signed: By: Everlean Alstrom M.D. On: 05/29/2015 11:32     MM Digital Diagnostic Unilat R (Order [id number]) - Reflex for Order [id number]   Study Result CLINICAL DATA: Post ultrasound-guided biopsy of a mass in the right breast at the 9:30 position. EXAM: DIAGNOSTIC RIGHT MAMMOGRAM POST ULTRASOUND BIOPSY  COMPARISON: Previous exam(s). FINDINGS: Mammographic images were obtained following ultrasound guided biopsy of a mass in the right breast at the 9:30 position. A wing shaped biopsy marking clip is present in the targeted location of the biopsied right breast mass. IMPRESSION: Appropriate wing shaped biopsy marking clip position post ultrasound-guided biopsy of a mass in the right breast at the 9:30 position. Final Assessment: Post Procedure Mammograms for Marker Placement Electronically Signed By: Everlean Alstrom M.D. On: 05/29/2015 11:31  DIAGNOSIS: A. BREAST, RIGHT, 9:30; ULTRASOUND-GUIDED CORE BIOPSY: - CYST WALL FRAGMENTS. - 2 MM INTRADUCTAL PAPILLOMA. - USUAL DUCTAL HYPERPLASIA. - NEGATIVE FOR ATYPIA AND MALIGNANCY. Result Information Abnormality Status Priority Source Final result (05/30/2015 1524) Routine [facility] Breast biopsy  Surgical pathology Status: Final result Visible to patient: Not Released Next appt: None   3wk ago SURGICAL PATHOLOGY Surgical Pathology CASE: [id number] PATIENT: [place] [facility] Report SPECIMEN SUBMITTED: A. Breast, right, 9:30, bx CLINICAL HISTORY: Right breast mass PRE-OPERATIVE DIAGNOSIS: Cyst? R/o malignancy POST-OPERATIVE DIAGNOSIS: None provided. DIAGNOSIS: A. BREAST, RIGHT, 9:30; ULTRASOUND-GUIDED CORE BIOPSY: - CYST WALL FRAGMENTS. - 2 MM INTRADUCTAL PAPILLOMA. - USUAL DUCTAL HYPERPLASIA. - NEGATIVE FOR ATYPIA AND MALIGNANCY. Comment: Correlation with imaging is recommended. GROSS DESCRIPTION: A. The specimen is received in a formalin-filled container labeled with the patient's name and right breast biopsy. Core pieces: multiple Measurement: aggregate, 1.3 x 0.8 x 0.1 cm Comments: yellow lobulated fibrofatty, marked black Entirely submitted in cassette(s): 1 Time/Date in fixative: collected and placed in formalin at 11:05 AM on 05/29/2015 Total fixation time: 6.25  hours Final Diagnosis performed by Bryan Lemma, MD. Electronically signed 05/30/2015 3:09:15PM The electronic signature indicates that the named Attending Pathologist has evaluated the specimen Technical component performed at [facility], [address] [place], [place] [address] Lab: [phone]  Dir: Darrick Penna. Evette Doffing, MD Professional component performed at [facility], [facility], [place], [place], [place] [address] Lab: [phone] Dir: Dellia Nims. Reuel Derby, MD Resulting Agency [facility] Specimen Collected: 05/29/15 11:12 AM Last Resulted: 05/30/15 3:24 PM.  The patient is a 55 year old female.   Other Problems Elbert Ewings, CMA; 06/19/2015 3:43 PM) Back Pain Bladder Problems Breast Cancer Lump In Breast  Past Surgical History Elbert Ewings, CMA; 06/19/2015 3:43 PM) Cesarean Section - 1 Colon Polyp Removal - Colonoscopy  Diagnostic Studies History Elbert Ewings, CMA; 06/19/2015 3:43 PM) Mammogram within last year Pap Smear 1-5 years ago  Allergies Elbert Ewings, CMA; 06/19/2015 3:43 PM) No Known Drug Allergies 06/19/2015  Medication History Elbert Ewings, CMA; 06/19/2015 3:43 PM) No Current Medications Medications Reconciled  Social History Elbert Ewings, CMA; 06/19/2015 3:43 PM) Alcohol use Occasional alcohol use. Caffeine use Coffee, Tea. Illicit drug use Uses yearly. Tobacco use Current every day smoker.  Family History Elbert Ewings, [place]; 06/19/2015 3:43 PM) Heart Disease Father. Heart disease in female family member before age 45  Pregnancy / Birth History Elbert Ewings, [place]; 06/19/2015 3:43 PM) Age at menarche 51 years. Gravida 1 Irregular periods Maternal age 59-25 Para 1     Review of Systems Elbert Ewings CMA; 06/19/2015 3:43 PM) General Not Present- Appetite Loss, Chills, Fatigue, Fever, Night Sweats, Weight Gain and Weight Loss. Skin Present- Dryness. Not Present- Change in Wart/Mole, Hives, Jaundice, New  Lesions, Non-Healing Wounds, Rash and Ulcer. HEENT Present- Wears glasses/contact lenses. Not Present- Earache, Hearing Loss, Hoarseness, Nose Bleed, Oral Ulcers, Ringing in the Ears, Seasonal Allergies, Sinus Pain, Sore Throat, Visual Disturbances and Yellow Eyes. Respiratory Not Present- Bloody sputum, Chronic Cough, Difficulty Breathing, Snoring and Wheezing. Breast Present- Breast Mass. Not Present- Breast Pain, Nipple Discharge and Skin Changes. Cardiovascular Not Present- Chest Pain, Difficulty Breathing Lying Down, Leg Cramps, Palpitations, Rapid Heart Rate, Shortness of Breath and Swelling of Extremities. Gastrointestinal Not Present- Abdominal Pain, Bloating, Bloody Stool, Change in Bowel Habits, Chronic diarrhea, Constipation, Difficulty Swallowing, Excessive gas, Gets full quickly at meals, Hemorrhoids, Indigestion, Nausea, Rectal Pain and Vomiting. Female Genitourinary Not Present- Frequency, Nocturia, Painful Urination, Pelvic Pain and Urgency. Musculoskeletal Present- Back Pain. Not Present- Joint Pain, Joint Stiffness, Muscle Pain, Muscle Weakness and Swelling of Extremities. Neurological Not Present- Decreased Memory, Fainting, Headaches, Numbness, Seizures, Tingling, Tremor, Trouble walking and Weakness. Psychiatric Not Present- Anxiety, Bipolar, Change in Sleep Pattern, Depression, Fearful and Frequent crying. Endocrine Not Present- Cold Intolerance, Excessive Hunger, Hair Changes, Heat Intolerance, Hot flashes and New Diabetes. Hematology Not Present- Easy Bruising, Excessive bleeding, Gland problems, HIV and Persistent Infections.  Vitals Elbert Ewings CMA; 06/19/2015 3:43 PM) 06/19/2015 3:43 PM Weight: 137 lb Height: 62in Body Surface Area: 1.63 m Body Mass Index: 25.06 kg/m  Temp.: 97.20F(Temporal)  Pulse: 71 (Regular)  BP: 130/68 (Sitting, Left Arm, Standard)      Physical Exam (Shanen Norris A. Jani Moronta MD; 06/19/2015 4:25 PM)  General Mental  Status-Alert. General Appearance-Consistent with stated age. Hydration-Well hydrated. Voice-Normal.  Head and Neck Head-normocephalic, atraumatic with no lesions or palpable masses. Trachea-midline. Thyroid Gland Characteristics - normal size and consistency.  Chest and Lung Exam Chest and lung exam reveals -quiet, even and easy respiratory effort with no use of accessory muscles and on auscultation, normal breath sounds, no adventitious sounds and normal vocal resonance. Inspection Chest Wall - Normal. Back - normal.  Breast Note: Right breast without mass lesion. Core biopsy changes noted. No hematoma. Right breast larger than  the left breast. Left breast status post lumpectomy and radiation changes with scar and left upper breast and axilla well healed.  Cardiovascular Cardiovascular examination reveals -normal heart sounds, regular rate and rhythm with no murmurs and normal pedal pulses bilaterally.  Neurologic Neurologic evaluation reveals -alert and oriented x 3 with no impairment of recent or remote memory. Mental Status-Normal.  Musculoskeletal Normal Exam - Left-Upper Extremity Strength Normal and Lower Extremity Strength Normal. Normal Exam - Right-Upper Extremity Strength Normal and Lower Extremity Strength Normal.  Lymphatic Head & Neck  General Head & Neck Lymphatics: Bilateral - Description - Normal. Axillary  General Axillary Region: Bilateral - Description - Normal. Tenderness - Non Tender.    Assessment & Plan (Catrena Vari A. Anadia Helmes MD; 06/19/2015 4:25 PM)  PAPILLOMA OF RIGHT BREAST (D24.1) Impression: recommend lumpectomy for papilloma on the right wants reduction on the right  will refer to plastics for opinion   Risk of lumpectomy include bleeding, infection, seroma, more surgery, use of seed/wire, wound care, cosmetic deformity and the need for other treatments, death , blood clots, death. Pt agrees to proceed.  Current  Plans The anatomy and the physiology was discussed. The pathophysiology and natural history of the disease was discussed. Options were discussed and recommendations were made. Technique, risks, benefits, & alternatives were discussed. Risks such as stroke, heart attack, bleeding, indection, death, and other risks discussed. Questions answered. The patient agrees to proceed. You are being scheduled for surgery - Our schedulers will call you.  You should hear from our office's scheduling department within 5 working days about the location, date, and time of surgery. We try to make accommodations for patient's preferences in scheduling surgery, but sometimes the OR schedule or the surgeon's schedule prevents [place] from making those accommodations.  If you have not heard from our office [phone]) in 5 working days, call the office and ask for your surgeon's nurse.  If you have other questions about your diagnosis, plan, or surgery, call the office and ask for your surgeon's nurse.  Pt Education - [facility] Breast Biopsy HCI: discussed with patient and provided information. HISTORY OF BREAST CANCER (Z85.3) Impression: Overall stable without evidence of recurrence.  Current Plans Pt Education - [facility] Free Text Education/Instructions: discussed with patient and provided information

## 2015-11-08 ENCOUNTER — Ambulatory Visit
Admission: RE | Admit: 2015-11-08 | Discharge: 2015-11-08 | Disposition: A | Discharge status: Home / Self Care | Payer: Worker's Compensation | Source: Ambulatory Visit | Attending: Medical | Admitting: Medical

## 2015-11-08 ENCOUNTER — Other Ambulatory Visit: Payer: Self-pay | Admitting: Medical

## 2015-11-08 DIAGNOSIS — S6992XA Unspecified injury of left wrist, hand and finger(s), initial encounter: Secondary | ICD-10-CM

## 2015-11-08 DIAGNOSIS — W230XXA Caught, crushed, jammed, or pinched between moving objects, initial encounter: Secondary | ICD-10-CM | POA: Insufficient documentation

## 2016-05-29 DIAGNOSIS — Z6825 Body mass index (BMI) 25.0-25.9, adult: Secondary | ICD-10-CM | POA: Diagnosis not present

## 2016-05-29 DIAGNOSIS — Z01419 Encounter for gynecological examination (general) (routine) without abnormal findings: Secondary | ICD-10-CM | POA: Diagnosis not present

## 2016-05-30 IMAGING — US US BREAST*R* LIMITED INC AXILLA
1 series · 13 of 25 positions shown · non-contrast
Comparison: Previous exam(s).

CLINICAL DATA: History of treated left breast cancer, status post
lumpectomy and radiation therapy in 3779.

EXAM:
DIGITAL DIAGNOSTIC BILATERAL MAMMOGRAM WITH 3D TOMOSYNTHESIS WITH
CAD
ULTRASOUND RIGHT BREAST

[Series 1: us breast*right* limited inc axilla · 0.06mm/px · 13 of 25 slices shown]
[im 1/25]
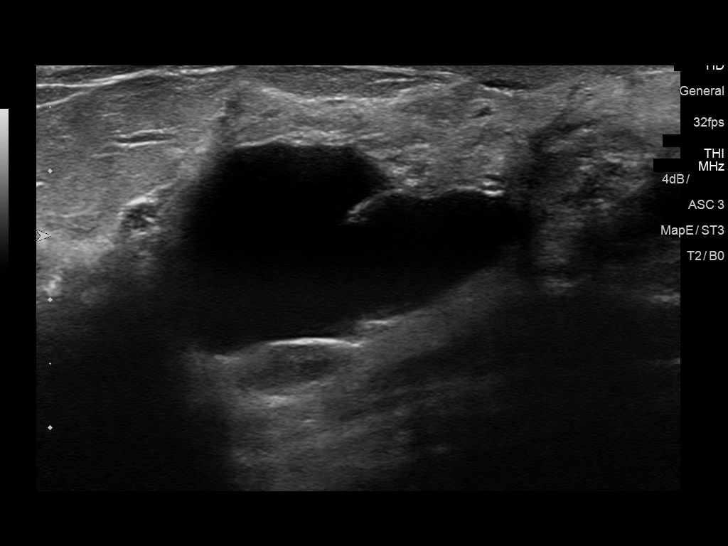
[im 3/25]
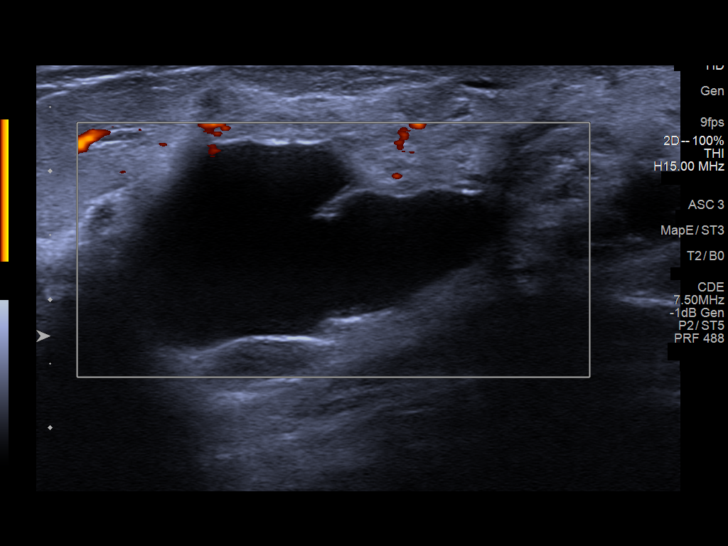
[im 5/25]
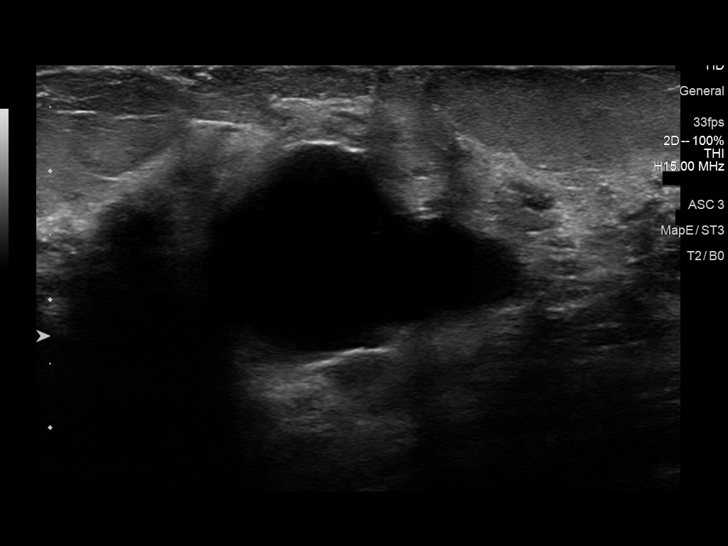
[im 7/25]
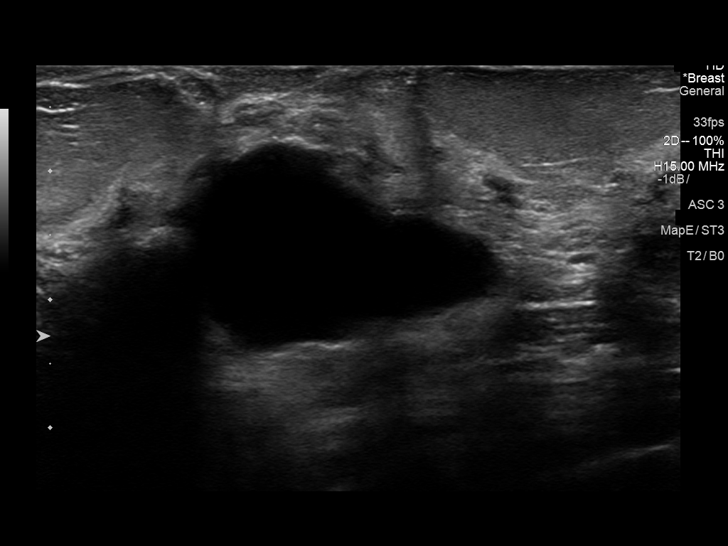
[im 9/25]
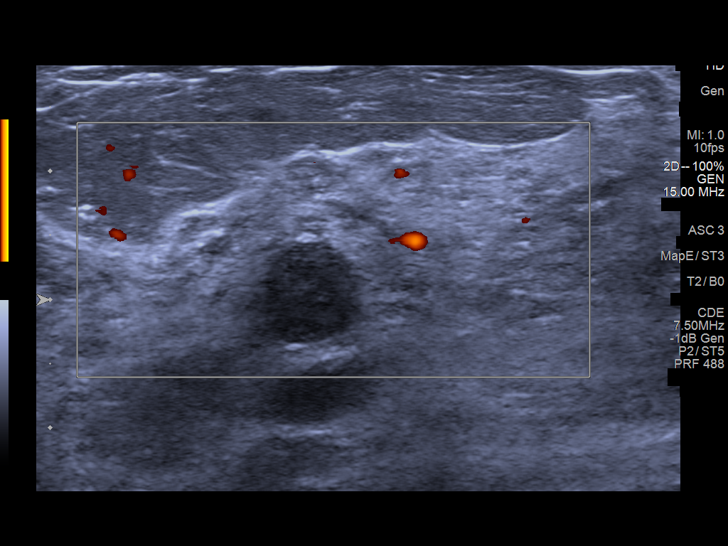
[im 11/25]
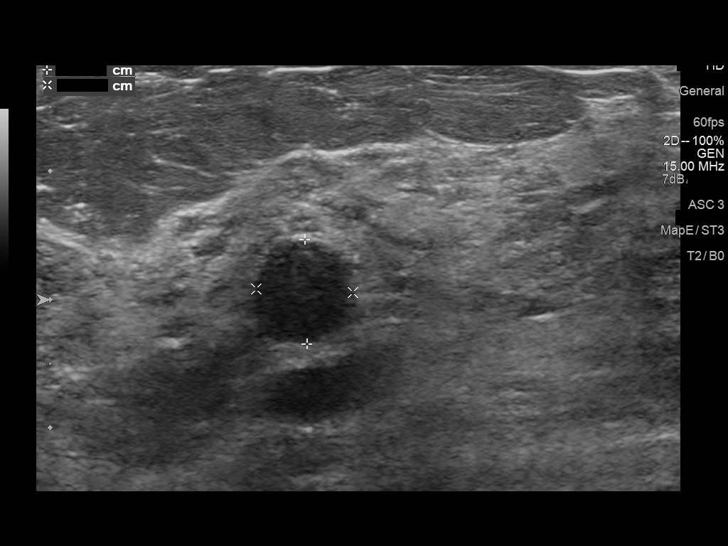
[im 13/25]
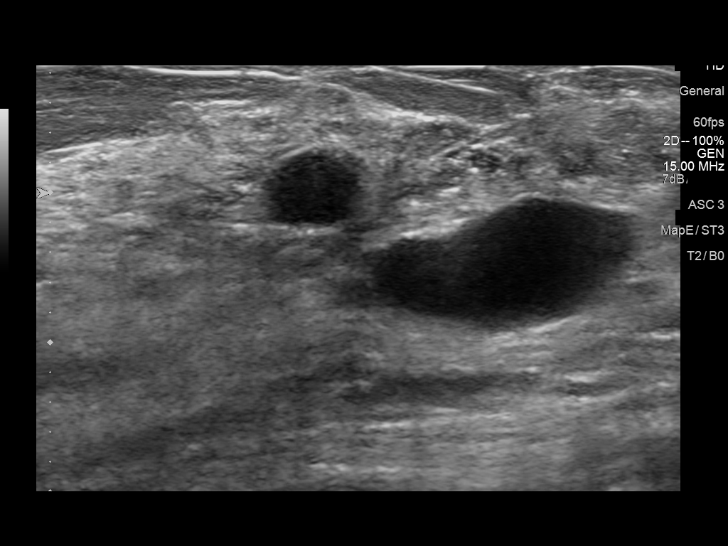
[im 15/25]
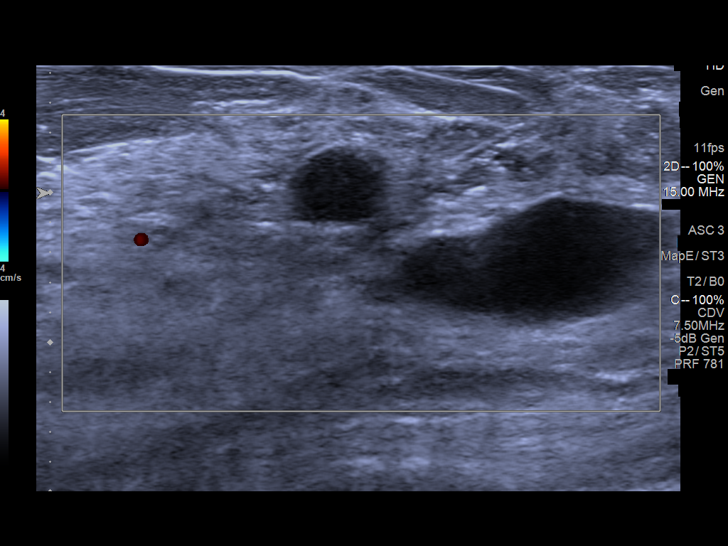
[im 17/25]
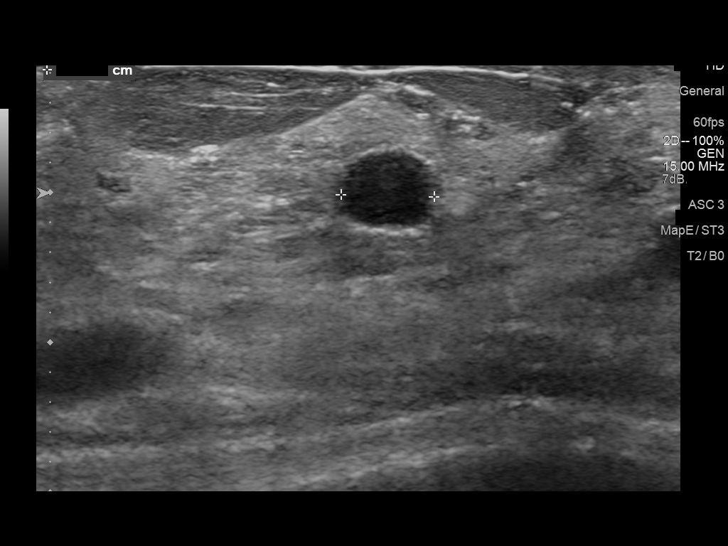
[im 19/25]
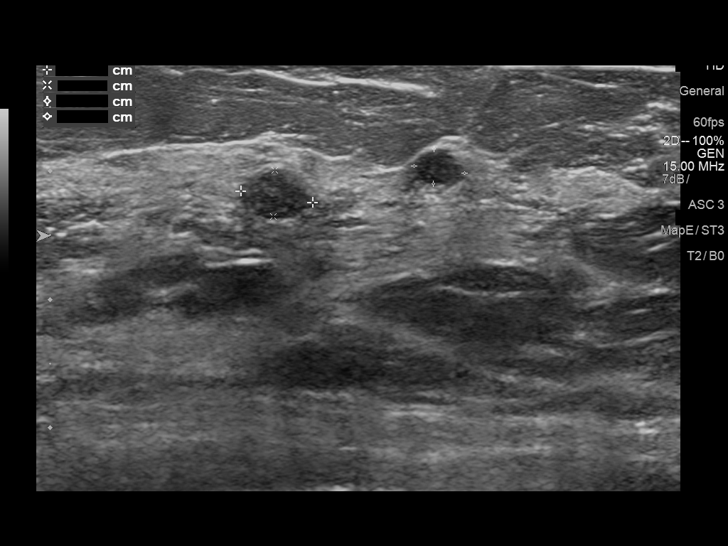
[im 21/25]
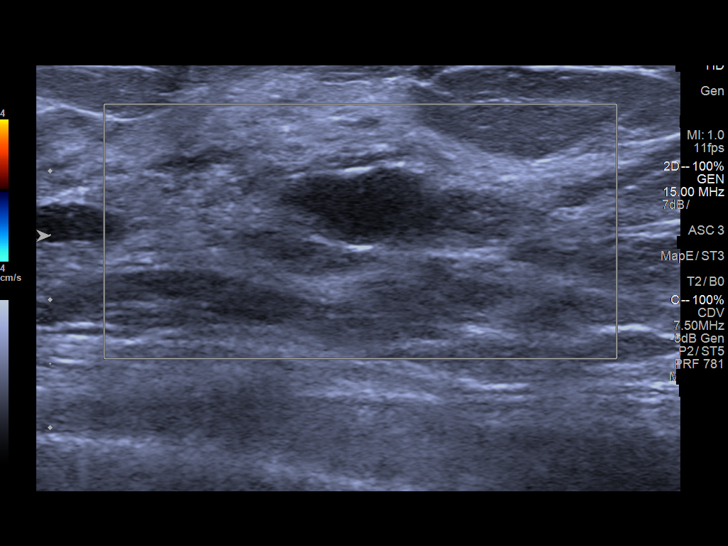
[im 23/25]
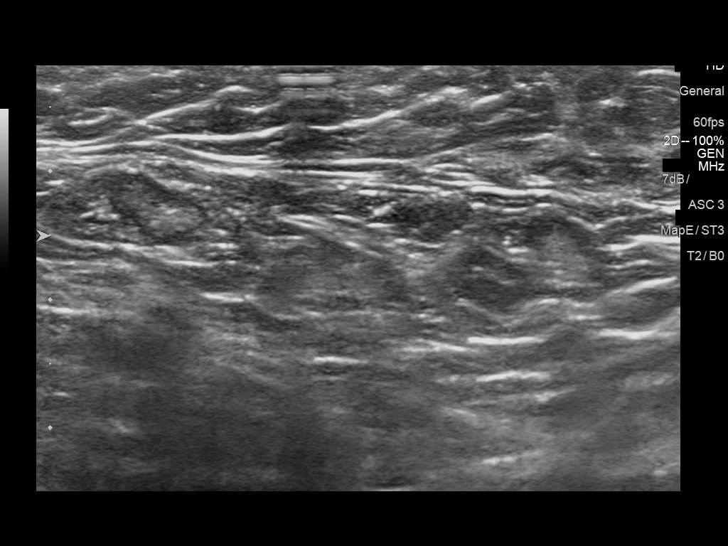
[im 25/25]
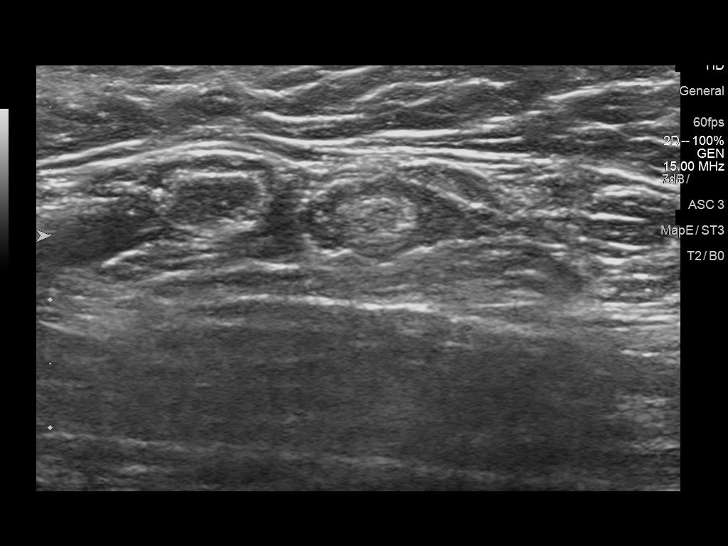

[13 of 25 positions shown; findings below may reference images not displayed]

ACR Breast Density Category c: The breast tissue is heterogeneously
dense, which may obscure small masses.
FINDINGS: There are no suspicious masses, areas of nonsurgical architectural
distortion or microcalcifications in the left breast. Post
lumpectomy changes in the left breast upper outer quadrant,
posterior depth, are stable. In the right breast, there are several
low-density circumscribed masses, the largest of which is located at
approximately 9 o'clock, anterior to middle depth.

Mammographic images were processed with CAD.

On physical exam, no suspicious masses are found.

Targeted ultrasound is performed, showing right breast 8 o'clock 1
cm from the nipple benign-appearing cyst measuring 3.1 by 1.5 by
cm. A probable complicated cyst is seen in the right retroareolar
breast measuring 9 mm as well. Additionally, there are several
hypoechoic circumscribed nodules. Mainly, there is a right breast
9:30 o'clock 2 cm from the nipple 0.8 x 0.8 x 0.9 cm such mass,
right breast 9:30 o'clock 1 cm from the nipple 0.6 by 0.5 by
such mass. Two other similar in appearance smaller nodules measuring
6 and 4 mm in greatest dimension are also noted in the right breast
9:30-10 o'clock 2 cm from the nipple.
IMPRESSION: No mammographic evidence of malignancy in the left breast, status
post left lumpectomy.

Several hypoechoic circumscribed masses in the right breast [DATE] and
10 o'clock, with predominantly benign appearance which however do
not satisfy the criteria for simple cysts. Given their multiplicity
and unilaterality, MRI of the breast may be considered for further
evaluation.

If MRI is not performed, ultrasound-guided cyst aspiration versus
core needle biopsy of the 9:30 o'clock mass, 2 cm from the nipple,
may be considered, with follow-up of the remainder of the masses,
assuming benign pathology results.

RECOMMENDATION:
Contrast-enhanced breast MRI.

I have discussed the findings and recommendations with the patient.
Results were also provided in writing at the conclusion of the
visit. If applicable, a reminder letter will be sent to the patient
regarding the next appointment.

BI-RADS CATEGORY  4: Suspicious.

## 2016-06-12 DIAGNOSIS — F17200 Nicotine dependence, unspecified, uncomplicated: Secondary | ICD-10-CM | POA: Diagnosis not present

## 2016-06-12 DIAGNOSIS — J301 Allergic rhinitis due to pollen: Secondary | ICD-10-CM | POA: Diagnosis not present

## 2016-06-12 DIAGNOSIS — H6982 Other specified disorders of Eustachian tube, left ear: Secondary | ICD-10-CM | POA: Diagnosis not present

## 2016-06-12 DIAGNOSIS — R51 Headache: Secondary | ICD-10-CM | POA: Diagnosis not present

## 2016-10-03 DIAGNOSIS — J069 Acute upper respiratory infection, unspecified: Secondary | ICD-10-CM | POA: Diagnosis not present

## 2016-10-06 ENCOUNTER — Ambulatory Visit: Payer: Self-pay | Admitting: Medical

## 2016-10-06 ENCOUNTER — Encounter: Payer: Self-pay | Admitting: Medical

## 2016-10-06 ENCOUNTER — Ambulatory Visit
Admission: RE | Admit: 2016-10-06 | Discharge: 2016-10-06 | Disposition: A | Discharge status: Home / Self Care | Payer: BLUE CROSS/BLUE SHIELD | Source: Ambulatory Visit | Attending: Medical | Admitting: Medical

## 2016-10-06 VITALS — BP 110/78 | HR 91 | Temp 97.3°F | Resp 16 | Ht 62.0 in | Wt 133.0 lb

## 2016-10-06 DIAGNOSIS — M4184 Other forms of scoliosis, thoracic region: Secondary | ICD-10-CM | POA: Insufficient documentation

## 2016-10-06 DIAGNOSIS — Z026 Encounter for examination for insurance purposes: Secondary | ICD-10-CM

## 2016-10-06 DIAGNOSIS — R059 Cough, unspecified: Secondary | ICD-10-CM

## 2016-10-06 DIAGNOSIS — R0602 Shortness of breath: Secondary | ICD-10-CM | POA: Diagnosis not present

## 2016-10-06 DIAGNOSIS — R05 Cough: Secondary | ICD-10-CM | POA: Diagnosis not present

## 2016-10-06 DIAGNOSIS — Z77120 Contact with and (suspected) exposure to mold (toxic): Secondary | ICD-10-CM | POA: Insufficient documentation

## 2016-10-06 DIAGNOSIS — J069 Acute upper respiratory infection, unspecified: Secondary | ICD-10-CM

## 2016-10-06 DIAGNOSIS — J029 Acute pharyngitis, unspecified: Secondary | ICD-10-CM

## 2016-10-06 LAB — POCT RAPID STREP A (OFFICE): Rapid Strep A Screen: NEGATIVE

## 2016-10-06 MED ORDER — ALBUTEROL SULFATE HFA 108 (90 BASE) MCG/ACT IN AERS: 2.0000 | INHALATION_SPRAY | Freq: Four times a day (QID) | RESPIRATORY_TRACT | 0 refills | Status: DC | PRN

## 2016-10-06 NOTE — Progress Notes (Signed)
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  According to [place] she was asked to clean the kitchen.  She opened the door to find the room covered in black and fuzzy green mold.  It was on the metal window sills, covering the exterior of the refrigerator and all over the cabinets etc.  The lead employee was [employer].  No mask or goggles were offered.  She said she could have gotten them, but would have had to go ask for them. She worked in the room for about 45 min.  When she went home that night she developed a rash on her arms and face.  The next morning she called her superviosr Mortimer Fries to let him know she was not coming to work because she was sick from cleaning the kitchen and went to [place].  She said she was dx with URI and prescribed Levaquin, prednisone, flonase, salt water gargle and delsym.   Subjective:    Patient ID: Linda Tran, female    DOB: 01-May-1960, 56 y.o.   MRN: [id number]  HPI 56 yo female comes into day for workers compensations documentation.  On Thursday  doing summer cleaning she was exposed to black and green mold. She worked in the area for about 45 minutes. She did not wear a mask. She says she developed a rash on her face and forearms bilaterally that was itchy and warm feeling. On Friday she was seen by  Gretta Arab was diagnoised with and an upper respiratory infection, and she was placed on Levaquin 500 mg one by mouth for  7 days, Prednisone taper pak 10 mg  for  6 days ( taking medications  6,5,4,3.2,1 tablets per day), Fluticasone 50mcg 2 sprays each nostril once a day. And Dextromethorphan 1 tsp by mouth every 12 hours as needed for cough.Todays she complained of Shortness of breath, not sure if her throat or her chest. No wheezing, No fever on Friday, but over the weekend felt hot/cold. She also says she self treated with her rash with otc hydrocortisone cream ( unsure of  the dosage)  1-2 times a day for skin rash  as needed. She also states her chest feels tight with coughing.   Review of Systems  Constitutional: Positive for fever. Negative for chills.  HENT: Positive for congestion, rhinorrhea and sore throat.   Eyes: Negative.   Respiratory: Positive for chest tightness and shortness of breath. Negative for wheezing.   Cardiovascular: Negative for chest pain, palpitations and leg swelling.  Gastrointestinal: Negative for abdominal pain.  Genitourinary: Negative for dysuria.  Musculoskeletal: Negative for back pain.       Objective:   Physical Exam  Constitutional: She is oriented to person, place, and time. Vital signs are normal. She appears well-developed and well-nourished.  HENT:  Head: Normocephalic and atraumatic.  Right Ear: Hearing, tympanic membrane, external ear and ear canal normal.  Left Ear: Hearing, tympanic membrane, external ear and ear canal normal.  Nose: Mucosal edema present.  Mouth/Throat: Uvula is midline and mucous membranes are normal. Posterior oropharyngeal erythema present.  Eyes: Conjunctivae and EOM are normal. Pupils are equal, round, and reactive to light.  Neck: Normal range of motion. Neck supple.  Cardiovascular: Normal rate, regular rhythm and normal heart sounds.   Pulmonary/Chest: Effort normal and breath sounds normal.  Musculoskeletal: Normal range of motion.  Lymphadenopathy:    She  has cervical adenopathy.  Neurological: She is alert and oriented to person, place, and time.  Skin: Skin is warm and dry. No rash noted.  Psychiatric: She has a normal mood and affect. Her behavior is normal. Judgment and thought content normal.  Nursing note and vitals reviewed.  Sitting calmly in room with no difficulty breathing. No cough noted in room. Able to take deep breaths without difficulty.  Strep test negative     Assessment & Plan:  Suspected Exposure to mold Upper Respiratory Infections most likely  viral. Pharyngitis. To continue current medications Levaquin, Prednsione, Fluticasone take as prescribed and  Dextromethorphan as needed. .  Avoid smoking . E-prescribed Albuterol MDI  2 puffs ever 6 hours as needed for cough, shortness of breath or wheezng. #1 no refills. Chest x-ray ordered. Will call patient with results. Scheduled for June  27 th  am for recheck. Worker compensation paperwork completed S- Faxed to [employer] . Patient states that she has an appointment with Human Resources this afternoon or tomorrow morning.  Chest x-ray within normal limits, no pleural effusions. Patient called and given results.4:40pm.

## 2016-10-08 ENCOUNTER — Ambulatory Visit: Payer: Self-pay | Admitting: Medical

## 2016-10-08 ENCOUNTER — Encounter: Payer: Self-pay | Admitting: Medical

## 2016-10-08 VITALS — BP 112/64 | HR 98 | Temp 98.1°F | Resp 18 | Ht 62.0 in | Wt 132.0 lb

## 2016-10-08 DIAGNOSIS — Z026 Encounter for examination for insurance purposes: Secondary | ICD-10-CM

## 2016-10-08 DIAGNOSIS — R059 Cough, unspecified: Secondary | ICD-10-CM

## 2016-10-08 DIAGNOSIS — J040 Acute laryngitis: Secondary | ICD-10-CM

## 2016-10-08 DIAGNOSIS — J069 Acute upper respiratory infection, unspecified: Secondary | ICD-10-CM

## 2016-10-08 DIAGNOSIS — R05 Cough: Secondary | ICD-10-CM

## 2016-10-08 DIAGNOSIS — J029 Acute pharyngitis, unspecified: Secondary | ICD-10-CM

## 2016-10-08 NOTE — Patient Instructions (Signed)
Add in OTC Zyrtec or Claritin to help with cough at night. Take as directed. Warm compresses to the neck area. 3-4 times a day. Dilute Salt water gargles  3-4 times a day. Soft foods, nothing hot or spicy. Return Monday recheck.

## 2016-10-08 NOTE — Progress Notes (Signed)
   Subjective:    Patient ID: Linda Tran, female    DOB: 1960-06-02, 56 y.o.   MRN: [id number]  HPI 56 yo female returns for rehceck of upper respiratory infection/ cough and exposure to mold. Coughing up phelgm , clear and white when she lays down.  Feels better as far as chest tightness, but overall does not feel worse or better.   Review of Systems  Constitutional: Negative for chills and fever.  HENT: Positive for congestion, rhinorrhea and voice change.   Eyes: Negative for pain and discharge.  Respiratory: Positive for cough. Negative for chest tightness and wheezing.   Cardiovascular: Negative for chest pain.  Gastrointestinal: Positive for abdominal pain.  Endocrine: Positive for polydipsia. Negative for polyphagia and polyuria.  Genitourinary: Negative for hematuria.  Musculoskeletal: Positive for neck pain. Negative for back pain.  Allergic/Immunologic: Positive for environmental allergies.  Neurological: Positive for dizziness. Negative for syncope and light-headedness.  Hematological: Positive for adenopathy.  Psychiatric/Behavioral: Negative for confusion and hallucinations.     ears popping and  swelling in the back of throat. Feels tight around shoulder area. Abdominal pain x 2 days center above the belly button,  Sharp , comes and goes.  Not sure if it is a gas, bowel movement normal per patient. Patient states it does not alarm her. Dizzy on bending over and standing up. No dizziness when up and walking around. Objective:   Physical Exam  Constitutional: She is oriented to person, place, and time. She appears well-developed and well-nourished.  HENT:  Head: Normocephalic and atraumatic.  Right Ear: External ear normal. A middle ear effusion is present.  Left Ear: External ear normal. A middle ear effusion is present.  Nose: Nose normal.  Eyes: Conjunctivae and EOM are normal. Pupils are equal, round, and reactive to light.  Neck: Normal range of motion. Neck  supple.  Cardiovascular: Normal rate, regular rhythm and normal heart sounds.  Exam reveals no gallop and no friction rub.   No murmur heard. Pulmonary/Chest: Effort normal and breath sounds normal.  Musculoskeletal: Normal range of motion.  Neurological: She is alert and oriented to person, place, and time.  Skin: Skin is warm and dry.  Psychiatric: She has a normal mood and affect. Her behavior is normal. Judgment and thought content normal.  Nursing note and vitals reviewed. uvula edematous and red, posterior pharynx improving erythema Mild ear effusion R>L Muscle spasm trapezius bilateral L>R  Hoarse voice noted  no cough noted in room.    Assessment & Plan:  Upper respiratory Infection, cough. Pharyngitis. Using inhaler with relief. Throat feels swollen. Add in OTC Zyrtec or Claritin to help with cough at night. Take as directed. Warm compresses to the neck area. 3-4 times a day. OTC Motrin or Tylenol for pain take as directed. Dilute Salt water gargles  3-4 times a day. Soft foods, nothing hot or spicy. Return Monday recheck. Patient verbalizes understanding and has no questions at discharge.

## 2016-10-13 ENCOUNTER — Ambulatory Visit: Payer: Self-pay | Admitting: Medical

## 2016-10-22 ENCOUNTER — Encounter: Payer: Self-pay | Admitting: Adult Health

## 2016-10-22 ENCOUNTER — Ambulatory Visit: Payer: Self-pay | Admitting: Adult Health

## 2016-10-22 VITALS — BP 108/70 | HR 100 | Temp 97.7°F | Resp 16 | Ht 63.0 in | Wt 132.0 lb

## 2016-10-22 DIAGNOSIS — R5383 Other fatigue: Secondary | ICD-10-CM

## 2016-10-22 DIAGNOSIS — R599 Enlarged lymph nodes, unspecified: Secondary | ICD-10-CM

## 2016-10-22 DIAGNOSIS — J329 Chronic sinusitis, unspecified: Secondary | ICD-10-CM

## 2016-10-22 MED ORDER — AMOXICILLIN-POT CLAVULANATE 875-125 MG PO TABS: 1.0000 | ORAL_TABLET | Freq: Two times a day (BID) | ORAL | 0 refills | Status: DC

## 2016-10-22 NOTE — Progress Notes (Signed)
Subjective:    Patient ID: Linda Tran, female    DOB: 26-Nov-1960, 56 y.o.   MRN: [id number]  HPI Patient is a 56 year old female who presents with 2 weeks. She was exposed to green mold in dorm rooms over two weeks ago. Patient started coughing intermittently  that night and had throat swelling, she was then seen [facility] treated 10/06/16 with Levaquin 7 days, and Prednisone x 6 days. History of smoking from Urgent care. She continues to have hoarseness/ laryngitis since symptoms presented and reports increased feelings of her lymph nodes swelling in her neck. It was intermittent hoarseness but now patients hoarseness is continuous. Denies any fevers, chills. Denies nausea vomiting or diarrhea.  She reports cough has stopped and no sputum. Denies nasal discharge. She is taking Flonase. She complains of increased fatigue.  She reports a smoking history of 30 years of 1 + packs per day and continues to smoke.  Strep test negative 6/25.[facility] X-ray was also done on 6/25 with no infiltrates and thoracic scoliosis. Breast Cancer left Breast 2013- completed radiation.  Allergies  Allergen Reactions  . Latex Rash   Social History   Social History  . Marital status: Single    Spouse name: N/A  . Number of children: N/A  . Years of education: N/A   Occupational History  . Not on file.   Social History Main Topics  . Smoking status: Current Every Day Smoker  . Smokeless tobacco: Never Used  . Alcohol use Yes     Comment: social  . Drug use: No  . Sexual activity: No   Other Topics Concern  . Not on file   Social History Narrative   Marital status: single; not dating since 2012.      Children:  One son; no grandchildren.      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 Negative for activity change, appetite change, chills, diaphoresis, fever and unexpected weight change.  HENT:  Positive for postnasal drip, rhinorrhea, sinus pain (frontal ), sinus pressure (frontal ), sore throat and voice change (hoarseness x 2 weeks. worse and not improving ). Negative for congestion, sneezing, tinnitus and trouble swallowing.   Eyes: Negative for photophobia, pain, discharge, redness, itching and visual disturbance.  Respiratory: Negative for apnea, cough, choking, chest tightness, shortness of breath, wheezing and stridor.   Cardiovascular: Negative for chest pain, palpitations and leg swelling.  Gastrointestinal: Negative for abdominal distention, abdominal pain, anal bleeding, blood in stool, constipation, diarrhea, nausea, rectal pain and vomiting.  Endocrine: Negative for polydipsia, polyphagia and polyuria.  Genitourinary: Negative for difficulty urinating, dyspareunia, dysuria, enuresis, flank pain, urgency, vaginal bleeding, vaginal discharge and vaginal pain.  Musculoskeletal: Negative for arthralgias, back pain, gait problem, joint swelling, myalgias, neck pain and neck stiffness.  Skin: Negative for color change, pallor, rash and wound.  Allergic/Immunologic: Positive for environmental allergies (spring and fall ). Negative for food allergies and immunocompromised state.  Neurological: Negative for dizziness, tremors, seizures, syncope, facial asymmetry, speech difficulty, weakness, light-headedness, numbness and headaches.  Hematological: Positive for adenopathy (around back of neck per patient ). Does not bruise/bleed easily.  Psychiatric/Behavioral: Negative for agitation, behavioral problems, confusion and suicidal ideas.       Objective:   Physical Exam  Constitutional: She is oriented to person, place, and time.  Vital signs are normal. She appears well-developed and well-nourished. She is active and cooperative.  Non-toxic appearance. She does not have a sickly appearance. She does not appear ill. No distress.  HENT:  Head: Normocephalic and atraumatic.  Right Ear:  Hearing, external ear and ear canal normal. A middle ear effusion is present.  Left Ear: Hearing, external ear and ear canal normal. A middle ear effusion is present.  Nose: Mucosal edema and rhinorrhea present. Right sinus exhibits frontal sinus tenderness. Right sinus exhibits no maxillary sinus tenderness. Left sinus exhibits frontal sinus tenderness. Left sinus exhibits no maxillary sinus tenderness.  Mouth/Throat: Uvula is midline and mucous membranes are normal. She does not have dentures. No oral lesions. No trismus in the jaw. Normal dentition. Uvula swelling (mild and with erythema) present. No dental abscesses, lacerations or dental caries. Posterior oropharyngeal erythema present. No oropharyngeal exudate, posterior oropharyngeal edema or tonsillar abscesses.  Eyes: Pupils are equal, round, and reactive to light. Conjunctivae, EOM and lids are normal. Right eye exhibits no discharge. Left eye exhibits no discharge. No scleral icterus.  Neck: Trachea normal, normal range of motion and full passive range of motion without pain. Neck supple. Normal carotid pulses, no hepatojugular reflux and no JVD present. No tracheal tenderness, no spinous process tenderness and no muscular tenderness present. Carotid bruit is not present. No neck rigidity. No tracheal deviation, no erythema and normal range of motion present. No Brudzinski's sign and no Kernig's sign noted. No thyroid mass and no thyromegaly present.  Cardiovascular: Normal rate, regular rhythm, S1 normal, S2 normal and normal heart sounds.  Exam reveals no gallop, no distant heart sounds and no friction rub.   No murmur heard.  No systolic murmur is present   No diastolic murmur is present  Pulmonary/Chest: Effort normal and breath sounds normal. No stridor. No apnea.  Abdominal: Soft. Normal appearance, normal aorta and bowel sounds are normal. There is no tenderness. There is no CVA tenderness.  Musculoskeletal: Normal range of motion.   Lymphadenopathy:       Head (right side): Submental, submandibular and occipital adenopathy present. No tonsillar, no preauricular and no posterior auricular adenopathy present.       Head (left side): Submental, submandibular and occipital adenopathy present. No tonsillar, no preauricular and no posterior auricular adenopathy present.    She has cervical adenopathy.       Right cervical: Superficial cervical and posterior cervical adenopathy present. No deep cervical adenopathy present.      Left cervical: Superficial cervical and posterior cervical adenopathy present. No deep cervical adenopathy present.    She has axillary adenopathy.  Neurological: She is alert and oriented to person, place, and time. She has normal strength.  Skin: Skin is warm, dry and intact. No rash noted. She is not diaphoretic. No cyanosis. No pallor. Nails show no clubbing.  Psychiatric: She has a normal mood and affect. Her speech is normal and behavior is normal. Judgment and thought content normal. Cognition and memory are normal.          Assessment & Plan:   1.Refer to ENT for continued throat hoarseness and lymphedema neck. 2.CBC with diff CMET  TSH (fatigue)  3.After discussing patient with supervising Dr. Rosanna Randy will obtain CMV, ESR and EBV labs. MD in agreement with ENT referral.  4. Advised smoking cessation.  Augmentin ordered as below: Meds ordered this encounter  Medications  . amoxicillin-clavulanate (AUGMENTIN) 875-125 MG tablet    Sig: Take 1 tablet by mouth 2 (  two) times daily.    Dispense:  20 tablet    Refill:  0   Patient will call back to office if she has not heard from ENT office/ Urgent referral was placed. Patient will follow up with clinic or urgent care emergency room if symptoms change or worsen. Patient verbalized understanding of the above instructions. Patient will schedule appointment with clinic after appointment with Ear Nose and Throat.

## 2016-10-23 DIAGNOSIS — Z789 Other specified health status: Secondary | ICD-10-CM | POA: Insufficient documentation

## 2016-10-23 LAB — CBC WITH DIFFERENTIAL/PLATELET
BASOS: 1 %
Basophils Absolute: 0 10*3/uL (ref 0.0–0.2)
EOS (ABSOLUTE): 0.3 10*3/uL (ref 0.0–0.4)
Eos: 5 %
HEMATOCRIT: 34.8 % (ref 34.0–46.6)
Hemoglobin: 10.9 g/dL — ABNORMAL LOW (ref 11.1–15.9)
IMMATURE GRANULOCYTES: 0 %
Immature Grans (Abs): 0 10*3/uL (ref 0.0–0.1)
LYMPHS ABS: 2.4 10*3/uL (ref 0.7–3.1)
Lymphs: 36 %
MCH: 25.5 pg — ABNORMAL LOW (ref 26.6–33.0)
MCHC: 31.3 g/dL — ABNORMAL LOW (ref 31.5–35.7)
MCV: 81 fL (ref 79–97)
MONOS ABS: 0.4 10*3/uL (ref 0.1–0.9)
Monocytes: 7 %
NEUTROS PCT: 51 %
Neutrophils Absolute: 3.4 10*3/uL (ref 1.4–7.0)
PLATELETS: 294 10*3/uL (ref 150–379)
RBC: 4.28 x10E6/uL (ref 3.77–5.28)
RDW: 17.8 % — AB (ref 12.3–15.4)
WBC: 6.6 10*3/uL (ref 3.4–10.8)

## 2016-10-23 LAB — COMPREHENSIVE METABOLIC PANEL
A/G RATIO: 1.7 (ref 1.2–2.2)
ALK PHOS: 60 IU/L (ref 39–117)
ALT: 11 IU/L (ref 0–32)
AST: 13 IU/L (ref 0–40)
Albumin: 4 g/dL (ref 3.5–5.5)
BUN/Creatinine Ratio: 18 (ref 9–23)
BUN: 11 mg/dL (ref 6–24)
Bilirubin Total: <0.2 mg/dL (ref 0.0–1.2)
CALCIUM: 9.2 mg/dL (ref 8.7–10.2)
CO2: 21 mmol/L (ref 20–29)
Chloride: 103 mmol/L (ref 96–106)
Creatinine, Ser: 0.6 mg/dL (ref 0.57–1.00)
GFR calc Af Amer: 120 mL/min/{1.73_m2} (ref >59)
GFR calc non Af Amer: 104 mL/min/{1.73_m2} (ref >59)
GLOBULIN, TOTAL: 2.3 g/dL (ref 1.5–4.5)
Glucose: 108 mg/dL — ABNORMAL HIGH (ref 65–99)
POTASSIUM: 4.2 mmol/L (ref 3.5–5.2)
SODIUM: 139 mmol/L (ref 134–144)
Total Protein: 6.3 g/dL (ref 6.0–8.5)

## 2016-10-23 NOTE — Progress Notes (Signed)
Spoke with pt regarding ENT referral.  Appt made for 7/26 and spoke with office about possibly moving appt closer, but this is their first available.  They will call pt if any cancellations. Also requested pt to rtc for sed rate blood draw at her convienence. Bubba Camp, RN

## 2016-10-23 NOTE — Patient Instructions (Signed)
Fatigue Fatigue is feeling tired all of the time, a lack of energy, or a lack of motivation. Occasional or mild fatigue is often a normal response to activity or life in general. However, long-lasting (chronic) or extreme fatigue may indicate an underlying medical condition. Follow these instructions at home: Watch your fatigue for any changes. The following actions may help to lessen any discomfort you are feeling:  Talk to your health care provider about how much sleep you need each night. Try to get the required amount every night.  Take medicines only as directed by your health care provider.  Eat a healthy and nutritious diet. Ask your health care provider if you need help changing your diet.  Drink enough fluid to keep your urine clear or pale yellow.  Practice ways of relaxing, such as yoga, meditation, massage therapy, or acupuncture.  Exercise regularly.  Change situations that cause you stress. Try to keep your work and personal routine reasonable.  Do not abuse illegal drugs.  Limit alcohol intake to no more than 1 drink per day for nonpregnant women and 2 drinks per day for men. One drink equals 12 ounces of beer, 5 ounces of wine, or 1 ounces of hard liquor.  Take a multivitamin, if directed by your health care provider.  Contact a health care provider if:  Your fatigue does not get better.  You have a fever.  You have unintentional weight loss or gain.  You have headaches.  You have difficulty: ? Falling asleep. ? Sleeping throughout the night.  You feel angry, guilty, anxious, or sad.  You are unable to have a bowel movement (constipation).  You skin is dry.  Your legs or another part of your body is swollen. Get help right away if:  You feel confused.  Your vision is blurry.  You feel faint or pass out.  You have a severe headache.  You have severe abdominal, pelvic, or back pain.  You have chest pain, shortness of breath, or an irregular or  fast heartbeat.  You are unable to urinate or you urinate less than normal.  You develop abnormal bleeding, such as bleeding from the rectum, vagina, nose, lungs, or nipples.  You vomit blood.  You have thoughts about harming yourself or committing suicide.  You are worried that you might harm someone else. This information is not intended to replace advice given to you by your health care provider. Make sure you discuss any questions you have with your health care provider. Document Released: 01/26/2007 Document Revised: 09/06/2015 Document Reviewed: 08/02/2013 Elsevier Interactive Patient Education  2018 [employer]. Sinusitis, Adult Sinusitis is soreness and inflammation of your sinuses. Sinuses are hollow spaces in the bones around your face. They are located:  Around your eyes.  In the middle of your forehead.  Behind your nose.  In your cheekbones.  Your sinuses and nasal passages are lined with a stringy fluid (mucus). Mucus normally drains out of your sinuses. When your nasal tissues get inflamed or swollen, the mucus can get trapped or blocked so air cannot flow through your sinuses. This lets bacteria, viruses, and funguses grow, and that leads to infection. Follow these instructions at home: Medicines  Take, use, or apply over-the-counter and prescription medicines only as told by your doctor. These may include nasal sprays.  If you were prescribed an antibiotic medicine, take it as told by your doctor. Do not stop taking the antibiotic even if you start to feel better. Hydrate and Humidify  Drink enough water to keep your pee (urine) clear or pale yellow.  Use a cool mist humidifier to keep the humidity level in your home above 50%.  Breathe in steam for 10-15 minutes, 3-4 times a day or as told by your doctor. You can do this in the bathroom while a hot shower is running.  Try not to spend time in cool or dry air. Rest  Rest as much as possible.  Sleep with  your head raised (elevated).  Make sure to get enough sleep each night. General instructions  Put a warm, moist washcloth on your face 3-4 times a day or as told by your doctor. This will help with discomfort.  Wash your hands often with soap and water. If there is no soap and water, use hand sanitizer.  Do not smoke. Avoid being around people who are smoking (secondhand smoke).  Keep all follow-up visits as told by your doctor. This is important. Contact a doctor if:  You have a fever.  Your symptoms get worse.  Your symptoms do not get better within 10 days. Get help right away if:  You have a very bad headache.  You cannot stop throwing up (vomiting).  You have pain or swelling around your face or eyes.  You have trouble seeing.  You feel confused.  Your neck is stiff.  You have trouble breathing. This information is not intended to replace advice given to you by your health care provider. Make sure you discuss any questions you have with your health care provider. Document Released: 09/17/2007 Document Revised: 11/25/2015 Document Reviewed: 01/24/2015 Elsevier Interactive Patient Education  2018 [place] Risks of Smoking Smoking cigarettes is very bad for your health. Tobacco smoke has over 200 known poisons in it. It contains the poisonous gases nitrogen oxide and carbon monoxide. There are over 60 chemicals in tobacco smoke that cause cancer. Smoking is difficult to quit because a chemical in tobacco, called nicotine, causes addiction or dependence. When you smoke and inhale, nicotine is absorbed rapidly into the bloodstream through your lungs. Both inhaled and non-inhaled nicotine may be addictive. What are the risks of cigarette smoke? Cigarette smokers have an increased risk of many serious medical problems, including:  Lung cancer.  Lung disease, such as pneumonia, bronchitis, and emphysema.  Chest pain (angina) and heart attack because the heart  is not getting enough oxygen.  Heart disease and peripheral blood vessel disease.  High blood pressure (hypertension).  Stroke.  Oral cancer, including cancer of the lip, mouth, or voice box.  Bladder cancer.  Pancreatic cancer.  Cervical cancer.  Pregnancy complications, including premature birth.  Stillbirths and smaller newborn babies, birth defects, and genetic damage to sperm.  Early menopause.  Lower estrogen level for women.  Infertility.  Facial wrinkles.  Blindness.  Increased risk of broken bones (fractures).  Senile dementia.  Stomach ulcers and internal bleeding.  Delayed wound healing and increased risk of complications during surgery.  Even smoking lightly shortens your life expectancy by several years.  Because of secondhand smoke exposure, children of smokers have an increased risk of the following:  Sudden infant death syndrome (SIDS).  Respiratory infections.  Lung cancer.  Heart disease.  Ear infections.  What are the benefits of quitting? There are many health benefits of quitting smoking. Here are some of them:  Within days of quitting smoking, your risk of having a heart attack decreases, your blood flow improves, and your lung capacity improves. Blood pressure, pulse  rate, and breathing patterns start returning to normal soon after quitting.  Within months, your lungs may clear up completely.  Quitting for 10 years reduces your risk of developing lung cancer and heart disease to almost that of a nonsmoker.  People who quit may see an improvement in their overall quality of life.  How do I quit smoking? Smoking is an addiction with both physical and psychological effects, and longtime habits can be hard to change. Your health care provider can recommend:  Programs and community resources, which may include group support, education, or talk therapy.  Prescription medicines to help reduce cravings.  Nicotine replacement  products, such as patches, gum, and nasal sprays. Use these products only as directed. Do not replace cigarette smoking with electronic cigarettes, which are commonly called e-cigarettes. The safety of e-cigarettes is not known, and some may contain harmful chemicals.  A combination of two or more of these methods.  Where to find more information:  American Lung Association: www.lung.org  American Cancer Society: www.cancer.org Summary  Smoking cigarettes is very bad for your health. Cigarette smokers have an increased risk of many serious medical problems, including several cancers, heart disease, and stroke.  Smoking is an addiction with both physical and psychological effects, and longtime habits can be hard to change.  By stopping right away, you can greatly reduce the risk of medical problems for you and your family.  To help you quit smoking, your health care provider can recommend programs, community resources, prescription medicines, and nicotine replacement products such as patches, gum, and nasal sprays. This information is not intended to replace advice given to you by your health care provider. Make sure you discuss any questions you have with your health care provider. Document Released: 05/08/2004 Document Revised: 04/04/2016 Document Reviewed: 04/04/2016 Elsevier Interactive Patient Education  2017 [employer] Hoarseness Hoarseness is any abnormal change in your voice.Hoarseness can make it difficult to speak. Your voice may sound raspy, breathy, or strained. Hoarseness is caused by a problem with the vocal cords. The vocal cords are two bands of tissue inside your voice box (larynx). When you speak, your vocal cords move back and forth to create sound. The surfaces of your vocal cords need to be smooth for your voice to sound clear. Swelling or lumps on the vocal cords can cause hoarseness. Common causes of vocal cord problems include:  Upper airway infection.  A  long-term cough.  Straining or overusing your voice.  Smoking.  Allergies.  Vocal cord growths.  Stomach acids that flow up from your stomach and irritate your vocal cords (gastroesophageal reflux).  Follow these instructions at home: Watch your condition for any changes. To ease any discomfort that you feel:  Rest your voice. Do not whisper. Whispering can cause muscle strain.  Do not speak in a loud or harsh voice that makes your hoarseness worse.  Do not use any tobacco products, including cigarettes, chewing tobacco, or electronic cigarettes. If you need help quitting, ask your health care provider.  Avoid secondhand smoke.  Do not eat foods that give you heartburn. Heartburn can make gastroesophageal reflux worse.  Do not drink coffee.  Do not drink alcohol.  Drink enough fluids to keep your urine clear or pale yellow.  Use a humidifier if the air in your home is dry.  Contact a health care provider if:  You have hoarseness that lasts longer than 3 weeks.  You almost lose or completelylose your voice for longer than 3 days.  You have pain when you swallow or try to talk.  You feel a lump in your neck. Get help right away if:  You have trouble swallowing.  You feel as though you are choking when you swallow.  You cough up blood or vomit blood.  You have trouble breathing. This information is not intended to replace advice given to you by your health care provider. Make sure you discuss any questions you have with your health care provider. Document Released: 03/14/2005 Document Revised: 09/06/2015 Document Reviewed: 03/22/2014 Elsevier Interactive Patient Education  [employer].

## 2016-10-23 NOTE — Addendum Note (Signed)
Addended by: Gean Quint on: 10/23/2016 03:26 PM   Modules accepted: Orders

## 2016-10-24 ENCOUNTER — Other Ambulatory Visit: Payer: Self-pay

## 2016-10-24 DIAGNOSIS — R5383 Other fatigue: Secondary | ICD-10-CM

## 2016-10-24 NOTE — Progress Notes (Signed)
Great ! Thank you for calling would like her to be seen as soon as available.

## 2016-10-24 NOTE — Progress Notes (Signed)
Per Dr.Gilbert with CBC results this patinet needs referral needed to send patient to GI for colonoscopy, no colonoscopy since age 56 and needs to see them soon. She said she has an appointment with [facility] to establish as new patient in August. She needs a referral to GI before then. Can you set her an appointment here to be seen for lab review/ so referral can be sent ?  Patient denies any rectal bleeding, no vaginal bleeding and menopausal. Linda Peace NP

## 2016-10-25 LAB — SEDIMENTATION RATE: Sed Rate: 17 mm/hr (ref 0–40)

## 2016-10-29 ENCOUNTER — Telehealth: Payer: Self-pay

## 2016-10-29 NOTE — Telephone Encounter (Signed)
See telephone note.

## 2016-10-31 ENCOUNTER — Ambulatory Visit: Payer: Self-pay | Admitting: Adult Health

## 2016-10-31 ENCOUNTER — Encounter: Payer: Self-pay | Admitting: Adult Health

## 2016-10-31 VITALS — BP 116/68 | HR 95 | Temp 98.1°F | Wt 135.4 lb

## 2016-10-31 DIAGNOSIS — Z77120 Contact with and (suspected) exposure to mold (toxic): Secondary | ICD-10-CM | POA: Insufficient documentation

## 2016-10-31 DIAGNOSIS — R5383 Other fatigue: Secondary | ICD-10-CM | POA: Insufficient documentation

## 2016-10-31 DIAGNOSIS — D649 Anemia, unspecified: Secondary | ICD-10-CM | POA: Insufficient documentation

## 2016-10-31 LAB — POCT URINALYSIS DIPSTICK
Bilirubin, UA: NEGATIVE
Glucose, UA: NEGATIVE
Ketones, UA: NEGATIVE
Leukocytes, UA: NEGATIVE
NITRITE UA: NEGATIVE
PH UA: 6 (ref 5.0–8.0)
RBC UA: NEGATIVE
Spec Grav, UA: 1.02 (ref 1.010–1.025)
UROBILINOGEN UA: 0.2 U/dL

## 2016-10-31 NOTE — Progress Notes (Signed)
Subjective:     Patient ID: Linda Tran, female   DOB: 10/03/1960, 56 y.o.   MRN: [id number]  HPI   Patient is a 56 year old female who returns to office to discuss lab results.   She does report that her voice is is improving.  She  Reports she  took 3 days of Augmentin that her son had and she is feeling better. She reports the claim number was not at [facility] and she was going to have to pay full cost for the Augmentin. She reports she contacted her case worker and her claim number is suppose t obe coming by mail . She will then pick up Augmentin and complete.Cost was not covered under workers compensation. She has a scheduled Ear Nose and Throat appointment next week for evaluation of hoarseness and lymph edema. She reports lymph swelling feels less than previous visit.,   She reports that she sees her gynecologist regularly.  In January  she did have a " irregular period" she reports it lasted a week bleed for week and that she has no regular periods just maybe every 4 to 5 months she may have vaginal bleeding. She has had  no menstrual/ vaginal bleeding since January. Sh reports she sees [facility] Dr. Deatra Ina saw Jan 2018. PAP done  and was normal per her report.   She reports her last colonoscopy was at age 42. She denies any rectal pain, bleeding, nausea, vomiting or diarrhea.She wants a referral to [facility] in the [place] office per her report.  She reports taking a multivitamin with iron and eating meat and leafy greens in her diet.  She does report a history for years of intermittent mild to moderate  headaches since radiation.Execdrin Migraine  and sleep relives headaches per her report. She does have occasional vomiting with the headaches. She reports they are sporadic and occur sometimes once a month to once every three months or longer. Vision exam she reports she  goes yearly. She does have contacts.   Radiation for breast cancer in  2013 and reports at that time she  did have low iron and required iron infusions.   She denies any other complaints or new symptoms or concerns  at this time . Blood pressure 116/68, pulse 95, temperature 98.1 F (36.7 C), temperature source Tympanic, weight 135 lb 6.4 oz (61.4 kg), SpO2 96 %.  Patient Active Problem List   Diagnosis Date Noted  . Anemia 10/31/2016  . Fatigue 10/31/2016  . Suspected exposure to mold 10/31/2016  Breast Cancer treated with surgery and radiation 2014 Social History   Social History  . Marital status: Single    Spouse name: N/A  . Number of children: N/A  . Years of education: N/A   Occupational History  . Not on file.   Social History Main Topics  . Smoking status: Current Every Day Smoker  . Smokeless tobacco: Never Used  . Alcohol use Yes     Comment: social  . Drug use: No  . Sexual activity: No   Other Topics Concern  . Not on file   Social History Narrative   Marital status: single; not dating since 2012.      Children:  One son; no grandchildren.      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  albuterol (PROVENTIL HFA;VENTOLIN HFA) 108 (90 Base) MCG/ACT inhaler, Inhale 2 puffs into the lungs every 6 (six) hours as needed for wheezing or shortness of breath. Or cough, Disp: 1 Inhaler, Rfl: 0 .  amoxicillin-clavulanate (AUGMENTIN) 875-125 MG tablet, Take 1 tablet by mouth 2 (two) times daily., Disp: 20 tablet, Rfl: 0 .  fluticasone (FLONASE) 50 MCG/ACT nasal spray, Place into both nostrils daily., Disp: , Rfl:   Review of Systems  Constitutional: Negative for activity change, appetite change, chills, diaphoresis, fatigue, fever and unexpected weight change.  HENT: Positive for sore throat (mild ) and voice change (hoarseness improving per her report, she does still have intermittently with speech). Negative for congestion, dental problem, drooling, ear discharge, ear pain, facial  swelling, hearing loss, mouth sores, nosebleeds, postnasal drip, rhinorrhea, sinus pain, sinus pressure, sneezing and trouble swallowing.   Eyes: Negative for photophobia, pain, discharge, redness, itching and visual disturbance.  Respiratory: Negative for apnea, cough, choking, chest tightness, shortness of breath, wheezing and stridor.   Cardiovascular: Negative for chest pain, palpitations and leg swelling.  Gastrointestinal: Negative for abdominal distention, abdominal pain, anal bleeding, blood in stool, constipation, diarrhea, nausea, rectal pain and vomiting.  Endocrine: Negative.   Genitourinary: Negative for decreased urine volume, difficulty urinating, dyspareunia, dysuria, enuresis, flank pain, frequency, genital sores, hematuria, menstrual problem, pelvic pain, urgency, vaginal bleeding, vaginal discharge and vaginal pain.  Musculoskeletal: Negative.   Skin: Negative.   Neurological: Positive for headaches (not currently but history of as documented in HPI ). Negative for tremors, seizures, syncope, speech difficulty, weakness, light-headedness and numbness.  Hematological: Negative.  Negative for adenopathy. Does not bruise/bleed easily.  Psychiatric/Behavioral: Negative.        Objective:   Physical Exam  Constitutional: She is oriented to person, place, and time. She appears well-developed and well-nourished. No distress.  HENT:  Head: Normocephalic and atraumatic.  Right Ear: Hearing, external ear and ear canal normal. A middle ear effusion is present.  Left Ear: Hearing and external ear normal. A middle ear effusion is present.  Nose: Nose normal. No mucosal edema, rhinorrhea, nose lacerations or sinus tenderness. Right sinus exhibits no maxillary sinus tenderness and no frontal sinus tenderness. Left sinus exhibits no maxillary sinus tenderness and no frontal sinus tenderness.  Mouth/Throat: Uvula is midline and mucous membranes are normal. Mucous membranes are not pale, not  dry and not cyanotic. Posterior oropharyngeal erythema (mild less than previous visit) present. No oropharyngeal exudate, posterior oropharyngeal edema or tonsillar abscesses.  Eyes: Pupils are equal, round, and reactive to light. Conjunctivae, EOM and lids are normal. Right eye exhibits no discharge. Left eye exhibits no discharge. No scleral icterus.  Neck: Trachea normal and normal range of motion. Neck supple. No JVD present. No thyromegaly present.  Cardiovascular: Normal rate, regular rhythm, normal heart sounds and intact distal pulses.   Pulmonary/Chest: Effort normal and breath sounds normal. No respiratory distress. She has no wheezes. She has no rales. She exhibits no tenderness.  Abdominal: Soft. Bowel sounds are normal. She exhibits no distension and no mass. There is no tenderness. There is no rebound and no guarding.  Musculoskeletal: Normal range of motion. She exhibits no edema or tenderness.  She moves on and off exam table without difficulty. She has a normal gait.   Lymphadenopathy:       Head (right side): Submental (decreased in size from previous visit ), submandibular (decreased in size from previous visit) and tonsillar (decreased in size from previous visit) adenopathy present. No  preauricular, no posterior auricular and no occipital adenopathy present.       Head (left side): No submental, no submandibular, no tonsillar, no preauricular, no posterior auricular and no occipital adenopathy present.    She has no cervical adenopathy.    She has no axillary adenopathy.       Right: No supraclavicular adenopathy present.       Left: No supraclavicular adenopathy present.  Neurological: She is alert and oriented to person, place, and time. She has normal strength and normal reflexes.  Skin: Skin is warm and dry. No rash noted. She is not diaphoretic. No erythema. No pallor.  Psychiatric: She has a normal mood and affect. Her speech is normal and behavior is normal. Judgment and  thought content normal. Cognition and memory are normal.        Recent Results (from the past 2160 hour(s))  POCT rapid strep A     Status: Normal   Collection Time: 10/06/16  1:16 PM  Result Value Ref Range   Rapid Strep A Screen Negative Negative  CBC w/Diff     Status: Abnormal   Collection Time: 10/22/16  4:08 PM  Result Value Ref Range   WBC 6.6 3.4 - 10.8 x10E3/uL   RBC 4.28 3.77 - 5.28 x10E6/uL   Hemoglobin 10.9 (L) 11.1 - 15.9 g/dL   Hematocrit 34.8 34.0 - 46.6 %   MCV 81 79 - 97 fL   MCH 25.5 (L) 26.6 - 33.0 pg   MCHC 31.3 (L) 31.5 - 35.7 g/dL   RDW 17.8 (H) 12.3 - 15.4 %   Platelets 294 150 - 379 x10E3/uL   Neutrophils 51 Not Estab. %   Lymphs 36 Not Estab. %   Monocytes 7 Not Estab. %   Eos 5 Not Estab. %   Basos 1 Not Estab. %   Neutrophils Absolute 3.4 1.4 - 7.0 x10E3/uL   Lymphocytes Absolute 2.4 0.7 - 3.1 x10E3/uL   Monocytes Absolute 0.4 0.1 - 0.9 x10E3/uL   EOS (ABSOLUTE) 0.3 0.0 - 0.4 x10E3/uL   Basophils Absolute 0.0 0.0 - 0.2 x10E3/uL   Immature Granulocytes 0 Not Estab. %   Immature Grans (Abs) 0.0 0.0 - 0.1 x10E3/uL  Comp Met (CMET)     Status: Abnormal   Collection Time: 10/22/16  4:08 PM  Result Value Ref Range   Glucose 108 (H) 65 - 99 mg/dL   BUN 11 6 - 24 mg/dL   Creatinine, Ser 0.60 0.57 - 1.00 mg/dL   GFR calc non Af Amer 104 >59 mL/min/1.73   GFR calc Af Amer 120 >59 mL/min/1.73   BUN/Creatinine Ratio 18 9 - 23   Sodium 139 134 - 144 mmol/L   Potassium 4.2 3.5 - 5.2 mmol/L   Chloride 103 96 - 106 mmol/L   CO2 21 20 - 29 mmol/L   Calcium 9.2 8.7 - 10.2 mg/dL   Total Protein 6.3 6.0 - 8.5 g/dL   Albumin 4.0 3.5 - 5.5 g/dL   Globulin, Total 2.3 1.5 - 4.5 g/dL   Albumin/Globulin Ratio 1.7 1.2 - 2.2   Bilirubin Total <0.2 0.0 - 1.2 mg/dL   Alkaline Phosphatase 60 39 - 117 IU/L   AST 13 0 - 40 IU/L   ALT 11 0 - 32 IU/L  Epstein-Barr virus VCA antibody panel     Status: Abnormal   Collection Time: 10/22/16  4:08 PM  Result Value Ref  Range   EBV VCA IgM <36.0 0.0 - 35.9 U/mL  Comment:                                  Negative        <36.0                                  Equivocal 36.0 - 43.9                                  Positive        >43.9    EBV Early Antigen Ab, IgG <9.0 0.0 - 8.9 U/mL    Comment:                                  Negative        < 9.0                                  Equivocal  9.0 - 10.9                                  Positive        >10.9    EBV VCA IgG >600.0 (H) 0.0 - 17.9 U/mL    Comment:                                  Negative        <18.0                                  Equivocal 18.0 - 21.9                                  Positive        >21.9    EBV NA IgG 32.3 (H) 0.0 - 17.9 U/mL    Comment:                                  Negative        <18.0                                  Equivocal 18.0 - 21.9                                  Positive        >21.9    Interpretation: Comment     Comment:                EBV Interpretation Chart Interpretation   EBV-IgM  EA(D)-IgG  VCA-IgG  EBNA-IgG EBV Seronegative    -        -         -          -  Early Phase         +        -         -          - Acute Primary       +       +or-       +          - Infection Convalescence/Past  -       +or-       +          + Infection Reactivated        +or-      +         +          + Infection        + Antibody Present      - Antibody Absent   CMV abs, IgG+IgM (cytomegalovirus)     Status: None   Collection Time: 10/22/16  4:08 PM  Result Value Ref Range   CMV Ab - IgG <0.60 0.00 - 0.59 U/mL    Comment:                                Negative          <0.60                                Equivocal   0.60 - 0.69                                Positive          >0.69    CMV IgM Ser EIA-aCnc <30.0 0.0 - 29.9 AU/mL    Comment:                                 Negative         <30.0                                 Equivocal  30.0 - 34.9                                 Positive          >34.9 A positive result is generally indicative of acute infection, reactivation or persistent IgM production.   TSH     Status: None   Collection Time: 10/22/16  4:08 PM  Result Value Ref Range   TSH 1.320 0.450 - 4.500 uIU/mL  Specimen status report     Status: None (Preliminary result)   Collection Time: 10/22/16  4:08 PM  Result Value Ref Range   specimen status report Comment     Comment: Written Authorization Written Authorization   Sedimentation rate     Status: None   Collection Time: 10/24/16  7:35 AM  Result Value Ref Range   Sed Rate 17 0 - 40 mm/hr  POCT urinalysis dipstick     Status: Abnormal   Collection Time: 10/31/16 10:48 AM  Result Value Ref Range   Color, UA Yellow    Clarity, UA  clear    Glucose, UA neg    Bilirubin, UA neg    Ketones, UA neg    Spec Grav, UA 1.020 1.010 - 1.025   Blood, UA neg    pH, UA 6.0 5.0 - 8.0   Protein, UA trace    Urobilinogen, UA 0.2 0.2 or 1.0 E.U./dL   Nitrite, UA neg    Leukocytes, UA Negative Negative       Assessment and Plan       1. Hoarseness: Follow up to mold exposure.  She has a an appointment with Ear Nose and Throat next week due to hoarseness and lymph edema. She will keep this appointment with them. She will call case manager regarding claim number to have Augmentin covered and complete the Augmentin as ordered and call clinic to schedule an appointment after ten day  course is completed for evaluation.   2. Anemia/ Fatigue:      Referral to [facility] made today in [facility]. Patient advised to call the office if she has not had a call in one week. She needs evaluation for anemia and colonoscopy.  Labs in office today- discussed with Dr. Thurston Hole as below Protein Electrophoresis : Discussed with Dr. Rosanna Randy  Iron, TIBC, Ferritin B 12 checked due to fatigue. Urinalysis to rule out any hematuria.  3. Patient reports she needs to make an appointment for a second hepatitis B shot and that she had the first  one in the office and missed the second appointment. She denies any reaction to first shot. Durward Mallard will follow up( as to when first immunization was as patient could not remember )week of 11/03/16 and check with provider in office if ok to schedule.   4. She has an appointment with Primary Care at [facility] August 3rd 2018 to establish care. Patient reports she will keep this appointment and establish primary care there.   5. Patient advised to return to the clinic as needed and as described above after finishing Augmentin for lymph node recheck/ office visit exam. Patient advised to seek Urgent care or Emergency room if any new symptoms develop or any symptoms change or worsen and clinic is closed. Patient verbalized understanding of all above instructions.

## 2016-10-31 NOTE — Patient Instructions (Signed)
Anemia, Nonspecific Anemia is a condition in which the concentration of red blood cells or hemoglobin in the blood is below normal. Hemoglobin is a substance in red blood cells that carries oxygen to the tissues of the body. Anemia results in not enough oxygen reaching these tissues. What are the causes? Common causes of anemia include:  Excessive bleeding. Bleeding may be internal or external. This includes excessive bleeding from periods (in women) or from the intestine.  Poor nutrition.  Chronic kidney, thyroid, and liver disease.  Bone marrow disorders that decrease red blood cell production.  Cancer and treatments for cancer.  HIV, AIDS, and their treatments.  Spleen problems that increase red blood cell destruction.  Blood disorders.  Excess destruction of red blood cells due to infection, medicines, and autoimmune disorders.  What are the signs or symptoms?  Minor weakness.  Dizziness.  Headache.  Palpitations.  Shortness of breath, especially with exercise.  Paleness.  Cold sensitivity.  Indigestion.  Nausea.  Difficulty sleeping.  Difficulty concentrating. Symptoms may occur suddenly or they may develop slowly. How is this diagnosed? Additional blood tests are often needed. These help your health care provider determine the best treatment. Your health care provider will check your stool for blood and look for other causes of blood loss. How is this treated? Treatment varies depending on the cause of the anemia. Treatment can include:  Supplements of iron, vitamin Y09, or folic acid.  Hormone medicines.  A blood transfusion. This may be needed if blood loss is severe.  Hospitalization. This may be needed if there is significant continual blood loss.  Dietary changes.  Spleen removal.  Follow these instructions at home: Keep all follow-up appointments. It often takes many weeks to correct anemia, and having your health care provider check on  your condition and your response to treatment is very important. Get help right away if:  You develop extreme weakness, shortness of breath, or chest pain.  You become dizzy or have trouble concentrating.  You develop heavy vaginal bleeding.  You develop a rash.  You have bloody or black, tarry stools.  You faint.  You vomit up blood.  You vomit repeatedly.  You have abdominal pain.  You have a fever or persistent symptoms for more than 2-3 days.  Referral  To Gastrointestinal MD at [facility] done today Scoliosis Scoliosis is the name given to a spine that curves sideways.Scoliosis can cause twisting of your shoulders, hips, chest, back, and rib cage. What are the causes? The cause of scoliosis is not always known. It may be caused by a birth defect or by a disease that can cause muscular dysfunction and imbalance, such as cerebral palsy and muscular dystrophy. What increases the risk? Having a disease that causes muscle disease or dysfunction. What are the signs or symptoms? Scoliosis often has no signs or symptoms.If they are present, they may include:  Unequal size of one body side compared to the other (asymmetry).  Visible curvature of the spine.  Pain. The pain may limit physical activity.  Shortness of breath.  Bowel or bladder issues.  How is this diagnosed? A skilled health care provider will perform an evaluation. This will involve:  Taking your history.  Performing a physical examination.  Performing a neurological exam to detect nerve or muscle function loss.  Range of motion studies on the spine.  X-rays.  An MRI may also be obtained. How is this treated? Treatment varies depending on the nature, extent, and severity of  the disease. If the curvature is not great, you may need only observation. A brace may be used to prevent scoliosis from progressing. A brace may also be needed during growth spurts. Physical therapy may be of benefit. Surgery  may be required. Follow these instructions at home:  Your health care provider may suggest exercises to strengthen your muscles. Perform them as directed.  Ask your health care provider before participating in any sports.  If you have been prescribed an orthopedic brace, wear it as instructed by your health care provider. Contact a health care provider if: Your brace causes the skin to become sore (chafe) or is uncomfortable. Get help right away if:  You have back pain that is not relieved by the medicines prescribed by your health care provider.  Your legs feel weak or you lose function in your legs.  You lose some bowel or bladder control. This information is not intended to replace advice given to you by your health care provider. Make sure you discuss any questions you have with your health care provider. Document Released: 03/28/2000 Document Revised: 09/06/2015 Document Reviewed: 10/04/2015 Elsevier Interactive Patient Education  2018 [place] have a fever and your symptoms suddenly get worse.  You are dehydrated. This information is not intended to replace advice given to you by your health care provider. Make sure you discuss any questions you have with your health care provider. Document Released: 05/08/2004 Document Revised: 09/12/2015 Document Reviewed: 09/24/2012 Elsevier Interactive Patient Education  2017 [employer].

## 2016-10-31 NOTE — Progress Notes (Signed)
Trace protein. Will send for culture

## 2016-10-31 NOTE — Progress Notes (Signed)
Results of EBV Panel show Convalescence/Past infection. Patient aware. Dr. Rosanna Randy reviewed as well with NP

## 2016-11-01 LAB — SPECIMEN STATUS

## 2016-11-03 LAB — CULTURE, URINE COMPREHENSIVE

## 2016-11-05 LAB — IRON,TIBC AND FERRITIN PANEL
Ferritin: 5 ng/mL — ABNORMAL LOW (ref 15–150)
IRON SATURATION: 5 % — AB (ref 15–55)
IRON: 19 ug/dL — AB (ref 27–159)
TIBC: 363 ug/dL (ref 250–450)
UIBC: 344 ug/dL (ref 131–425)

## 2016-11-05 LAB — PROTEIN ELECTROPHORESIS, SERUM
A/G Ratio: 1.4 (ref 0.7–1.7)
ALPHA 1: 0.2 g/dL (ref 0.0–0.4)
ALPHA 2: 0.7 g/dL (ref 0.4–1.0)
Albumin ELP: 3.4 g/dL (ref 2.9–4.4)
BETA: 1 g/dL (ref 0.7–1.3)
GLOBULIN, TOTAL: 2.4 g/dL (ref 2.2–3.9)
Gamma Globulin: 0.5 g/dL (ref 0.4–1.8)
Total Protein: 5.8 g/dL — ABNORMAL LOW (ref 6.0–8.5)

## 2016-11-06 ENCOUNTER — Other Ambulatory Visit: Payer: Self-pay | Admitting: Adult Health

## 2016-11-06 MED ORDER — IRON 325 (65 FE) MG PO TABS: 1.0000 | ORAL_TABLET | Freq: Once | ORAL | 1 refills | Status: DC

## 2016-11-06 MED ORDER — VITRON-C 65-125 MG PO TABS: 65.0000 mg | ORAL_TABLET | Freq: Two times a day (BID) | ORAL | 1 refills | Status: DC

## 2016-11-06 NOTE — Progress Notes (Signed)
Spoke with Rutha Bouchard D at [facility] . who advised Vitron C as prescribed below due to patients insurance coverage.  Weight/ dose calculated by NP and pharm D. Weight 130 lbs currently.   Meds ordered this encounter  Medications  . VITRON-C 65-125 MG TABS    Sig: Take 65-125 mg by mouth 2 (two) times daily. Recheck labs in 4 weeks, call for lab appointment    Dispense:  60 tablet    Refill:  1  Patient informed and will follow up with her primary care  here in office to have recheck of labs. CBC with diff/serum  Iron/ ferritin/ TIBC.  Patient reports she has not seen GI appointment as referred due to family issues. She reports she will schedule appointment. Advised to schedule GI appointment as soon as possible for evaluation. Follow  Up with clinic or primary care for any new symptoms. Emergency room or Urgent care for any changing or worsening symptoms. Patient verbalized understanding of all of the above.

## 2016-11-06 NOTE — Progress Notes (Signed)
Labs reviewed with Dr. Rosanna Randy and agree plan is to add iron supplement.  Prescription for iron e prescribed as below.  Meds ordered this encounter  Medications  . Ferrous Sulfate (IRON) 325 (65 Fe) MG TABS    Sig: Take 1 tablet by mouth once. Take with Vitamin C for better absorption    Dispense:  30 each    Refill:  1   Recheck labs in 3 months or patient can have rechecked with her primary care appointment. Please inform Primary care and Gastrointestinal MD you are taking iron. Will enter information in my chart regarding anemia, and taking iron.

## 2016-11-07 ENCOUNTER — Encounter: Payer: Self-pay | Admitting: Gastroenterology

## 2016-11-14 LAB — CMV ABS, IGG+IGM (CYTOMEGALOVIRUS): CMV Ab - IgG: <0.6 U/mL (ref 0.00–0.59)

## 2016-11-14 LAB — EPSTEIN-BARR VIRUS VCA ANTIBODY PANEL
EBV Early Antigen Ab, IgG: <9 U/mL (ref 0.0–8.9)
EBV NA IGG: 32.3 U/mL — AB (ref 0.0–17.9)
EBV VCA IgG: >600 U/mL — ABNORMAL HIGH (ref 0.0–17.9)
EBV VCA IgM: <36 U/mL (ref 0.0–35.9)

## 2016-11-14 LAB — SPECIMEN STATUS REPORT

## 2016-11-14 LAB — TSH: TSH: 1.32 u[IU]/mL (ref 0.450–4.500)

## 2016-11-18 ENCOUNTER — Ambulatory Visit: Payer: Worker's Compensation | Admitting: Family Medicine

## 2016-12-17 ENCOUNTER — Ambulatory Visit: Payer: Self-pay | Admitting: Adult Health

## 2016-12-17 ENCOUNTER — Encounter: Payer: Self-pay | Admitting: Adult Health

## 2016-12-17 VITALS — BP 120/72 | HR 102 | Temp 98.8°F | Resp 16 | Ht 62.0 in | Wt 134.0 lb

## 2016-12-17 DIAGNOSIS — Z77120 Contact with and (suspected) exposure to mold (toxic): Secondary | ICD-10-CM

## 2016-12-17 DIAGNOSIS — D649 Anemia, unspecified: Secondary | ICD-10-CM

## 2016-12-17 NOTE — Progress Notes (Signed)
Subjective:     Patient ID: Linda Tran, female   DOB: Aug 24, 1960, 56 y.o.   MRN: [id number]  HPI  Patient is a 56 year old female in no acute distress who presents to the office for follow up to workers comp due to nasal congestion and mold. She reports she feels like " the sinus has cleared up".   Recheck CBC/ Iron October 25 th - iron levels and CBC. (90 days).  She has October 4th she has colonoscopy scheduled per her report.  Patient also reports she is taking her iron as prescribed and " has much more energy". She reports she no longer needs to take a 20 minute power nap as she was previously. She has been taking for the past month and is due for labs the end of October. She is having regular bowel movements and denies any visual blood. She denies any abnormal vaginal bleeding and saw her GYN in January 2018 per her report. She also recently canceled her new PCP appointment at [facility] due to family emergency but will be rescheduling per her report.  She denies any new symptoms.   Blood pressure 120/72, pulse (!) 102, temperature 98.8 F (37.1 C), resp. rate 16, height 5\' 2"  (1.575 m), weight 134 lb (60.8 kg), SpO2 98 %. Recheck heart rate 78 beats per minute Vitals:   12/17/16 1513  Weight: 134 lb (60.8 kg)  Height: 5\' 2"  (1.575 m)    Current Outpatient Prescriptions:  .  albuterol (PROVENTIL HFA;VENTOLIN HFA) 108 (90 Base) MCG/ACT inhaler, Inhale 2 puffs into the lungs every 6 (six) hours as needed for wheezing or shortness of breath. Or cough, Disp: 1 Inhaler, Rfl: 0 .  amoxicillin-clavulanate (AUGMENTIN) 875-125 MG tablet, Take 1 tablet by mouth 2 (two) times daily., Disp: 20 tablet, Rfl: 0 .  fluticasone (FLONASE) 50 MCG/ACT nasal spray, Place into both nostrils daily., Disp: , Rfl:  .  VITRON-C 65-125 MG TABS, Take 65-125 mg by mouth 2 (two) times daily. Recheck labs in 4 weeks, call for lab appointment, Disp: 60 tablet, Rfl: 1  Review of Systems  Constitutional:  Negative.   HENT: Negative.   Eyes: Negative.   Respiratory: Negative.   Cardiovascular: Negative.   Gastrointestinal: Negative.   Endocrine: Negative.   Genitourinary: Negative.   Musculoskeletal: Negative.   Skin: Negative.   Neurological: Negative.   Hematological: Negative.   Psychiatric/Behavioral: Negative.        Objective:   Physical Exam  Constitutional: She is oriented to person, place, and time. She appears well-developed and well-nourished. No distress.  HENT:  Right Ear: Hearing, tympanic membrane, external ear and ear canal normal.  Left Ear: Hearing, tympanic membrane, external ear and ear canal normal.  Nose: Nose normal. No mucosal edema or rhinorrhea.  Mouth/Throat: Uvula is midline, oropharynx is clear and moist and mucous membranes are normal.  Eyes: Pupils are equal, round, and reactive to light. Conjunctivae, EOM and lids are normal.  Neck: Trachea normal and normal range of motion.  Cardiovascular: Normal rate, regular rhythm, S1 normal, S2 normal, normal heart sounds, intact distal pulses and normal pulses.  Exam reveals no gallop, no distant heart sounds and no friction rub.   Pulmonary/Chest: Effort normal and breath sounds normal.  Abdominal: Normal appearance.  Musculoskeletal: Normal range of motion. She exhibits no edema, tenderness or deformity.  Neurological: She is alert and oriented to person, place, and time. She has normal reflexes. She displays normal reflexes. No cranial  nerve deficit. She exhibits normal muscle tone. Coordination normal.  Skin: Skin is warm and dry. No rash noted. She is not diaphoretic. No erythema. No pallor.  Psychiatric: She has a normal mood and affect. Her speech is normal and behavior is normal. Judgment and thought content normal. Cognition and memory are normal.       Assessment:     Exposure to mold  Follow up to other encounter      Plan:     1. Patient has been evaluated by ENT.  2. Should avoid exposure  to mold in future.  3. Patient is asymptomatic at this time and denies any further questions.   CBC and Iron panel October 2018/I have already ordered this as a future order in the computer.

## 2016-12-17 NOTE — Patient Instructions (Signed)
Allergic Rhinitis Allergic rhinitis is when the mucous membranes in the nose respond to allergens. Allergens are particles in the air that cause your body to have an allergic reaction. This causes you to release allergic antibodies. Through a chain of events, these eventually cause you to release histamine into the blood stream. Although meant to protect the body, it is this release of histamine that causes your discomfort, such as frequent sneezing, congestion, and an itchy, runny nose. What are the causes? Seasonal allergic rhinitis (hay fever) is caused by pollen allergens that may come from grasses, trees, and weeds. Year-round allergic rhinitis (perennial allergic rhinitis) is caused by allergens such as house dust mites, pet dander, and mold spores. What are the signs or symptoms?  Nasal stuffiness (congestion).  Itchy, runny nose with sneezing and tearing of the eyes. How is this diagnosed? Your health care provider can help you determine the allergen or allergens that trigger your symptoms. If you and your health care provider are unable to determine the allergen, skin or blood testing may be used. Your health care provider will diagnose your condition after taking your health history and performing a physical exam. Your health care provider may assess you for other related conditions, such as asthma, pink eye, or an ear infection. How is this treated? Allergic rhinitis does not have a cure, but it can be controlled by:  Medicines that block allergy symptoms. These may include allergy shots, nasal sprays, and oral antihistamines.  Avoiding the allergen.  Hay fever may often be treated with antihistamines in pill or nasal spray forms. Antihistamines block the effects of histamine. There are over-the-counter medicines that may help with nasal congestion and swelling around the eyes. Check with your health care provider before taking or giving this medicine. If avoiding the allergen or the  medicine prescribed do not work, there are many new medicines your health care provider can prescribe. Stronger medicine may be used if initial measures are ineffective. Desensitizing injections can be used if medicine and avoidance does not work. Desensitization is when a patient is given ongoing shots until the body becomes less sensitive to the allergen. Make sure you follow up with your health care provider if problems continue. Follow these instructions at home: It is not possible to completely avoid allergens, but you can reduce your symptoms by taking steps to limit your exposure to them. It helps to know exactly what you are allergic to so that you can avoid your specific triggers. Contact a health care provider if:  You have a fever.  You develop a cough that does not stop easily (persistent).  You have shortness of breath.  You start wheezing.  Symptoms interfere with normal daily activities. This information is not intended to replace advice given to you by your health care provider. Make sure you discuss any questions you have with your health care provider. Document Released: 12/24/2000 Document Revised: 11/30/2015 Document Reviewed: 12/06/2012 Elsevier Interactive Patient Education  2017 [employer] Anemia, Nonspecific Anemia is a condition in which the concentration of red blood cells or hemoglobin in the blood is below normal. Hemoglobin is a substance in red blood cells that carries oxygen to the tissues of the body. Anemia results in not enough oxygen reaching these tissues. What are the causes? Common causes of anemia include:  Excessive bleeding. Bleeding may be internal or external. This includes excessive bleeding from periods (in women) or from the intestine.  Poor nutrition.  Chronic kidney, thyroid, and liver disease.  Bone marrow disorders that decrease red blood cell production.  Cancer and treatments for cancer.  HIV, AIDS, and their  treatments.  Spleen problems that increase red blood cell destruction.  Blood disorders.  Excess destruction of red blood cells due to infection, medicines, and autoimmune disorders.  What are the signs or symptoms?  Minor weakness.  Dizziness.  Headache.  Palpitations.  Shortness of breath, especially with exercise.  Paleness.  Cold sensitivity.  Indigestion.  Nausea.  Difficulty sleeping.  Difficulty concentrating. Symptoms may occur suddenly or they may develop slowly. How is this diagnosed? Additional blood tests are often needed. These help your health care provider determine the best treatment. Your health care provider will check your stool for blood and look for other causes of blood loss. How is this treated? Treatment varies depending on the cause of the anemia. Treatment can include:  Supplements of iron, vitamin Q22, or folic acid.  Hormone medicines.  A blood transfusion. This may be needed if blood loss is severe.  Hospitalization. This may be needed if there is significant continual blood loss.  Dietary changes.  Spleen removal.  Follow these instructions at home: Keep all follow-up appointments. It often takes many weeks to correct anemia, and having your health care provider check on your condition and your response to treatment is very important. Get help right away if:  You develop extreme weakness, shortness of breath, or chest pain.  You become dizzy or have trouble concentrating.  You develop heavy vaginal bleeding.  You develop a rash.  You have bloody or black, tarry stools.  You faint.  You vomit up blood.  You vomit repeatedly.  You have abdominal pain.  You have a fever or persistent symptoms for more than 2-3 days.  You have a fever and your symptoms suddenly get worse.  You are dehydrated. This information is not intended to replace advice given to you by your health care provider. Make sure you discuss any  questions you have with your health care provider. Document Released: 05/08/2004 Document Revised: 09/12/2015 Document Reviewed: 09/24/2012 Elsevier Interactive Patient Education  2017 [employer].

## 2017-01-06 ENCOUNTER — Ambulatory Visit (AMBULATORY_SURGERY_CENTER): Payer: Self-pay | Admitting: *Deleted

## 2017-01-06 VITALS — Ht 62.0 in | Wt 133.4 lb

## 2017-01-06 DIAGNOSIS — Z1211 Encounter for screening for malignant neoplasm of colon: Secondary | ICD-10-CM

## 2017-01-06 MED ORDER — NA SULFATE-K SULFATE-MG SULF 17.5-3.13-1.6 GM/177ML PO SOLN: 1.0000 [IU] | Freq: Once | ORAL | 0 refills | Status: AC

## 2017-01-06 NOTE — Progress Notes (Signed)
No egg or soy allergy known to patient  No issues with past sedation with any surgeries  or procedures, no intubation problems  No diet pills per patient No home 02 use per patient  No blood thinners per patient  Pt denies issues with constipation on iron now  No A fib or A flutter  EMMI video sent to pt's e mail pt. declined

## 2017-01-09 ENCOUNTER — Encounter: Payer: Self-pay | Admitting: Gastroenterology

## 2017-01-09 ENCOUNTER — Telehealth: Payer: Self-pay | Admitting: Gastroenterology

## 2017-01-09 DIAGNOSIS — Z1211 Encounter for screening for malignant neoplasm of colon: Secondary | ICD-10-CM

## 2017-01-12 MED ORDER — PEG 3350-KCL-NABCB-NACL-NASULF 240 G PO SOLR: 4000.0000 mL | ORAL | 0 refills | Status: DC

## 2017-01-12 NOTE — Telephone Encounter (Signed)
Pt does not want $15 coupon for prep.  Colyte split dose prep sent to patient's pharmacy and instructions mailed to pt.  She will call when she receives instructions and have PV nurse review with her.

## 2017-01-12 NOTE — Telephone Encounter (Signed)
Tried to reach pt; no answer. Will try later.

## 2017-01-20 ENCOUNTER — Ambulatory Visit (AMBULATORY_SURGERY_CENTER): Payer: BLUE CROSS/BLUE SHIELD | Admitting: Gastroenterology

## 2017-01-20 ENCOUNTER — Encounter: Payer: Self-pay | Admitting: Gastroenterology

## 2017-01-20 VITALS — BP 130/74 | HR 84 | Temp 98.2°F | Resp 15 | Ht 62.0 in | Wt 133.2 lb

## 2017-01-20 DIAGNOSIS — D125 Benign neoplasm of sigmoid colon: Secondary | ICD-10-CM

## 2017-01-20 DIAGNOSIS — D126 Benign neoplasm of colon, unspecified: Secondary | ICD-10-CM | POA: Diagnosis not present

## 2017-01-20 DIAGNOSIS — Z1212 Encounter for screening for malignant neoplasm of rectum: Secondary | ICD-10-CM | POA: Diagnosis not present

## 2017-01-20 DIAGNOSIS — Z1211 Encounter for screening for malignant neoplasm of colon: Secondary | ICD-10-CM

## 2017-01-20 DIAGNOSIS — K635 Polyp of colon: Secondary | ICD-10-CM

## 2017-01-20 DIAGNOSIS — D122 Benign neoplasm of ascending colon: Secondary | ICD-10-CM | POA: Diagnosis not present

## 2017-01-20 MED ORDER — SODIUM CHLORIDE 0.9 % IV SOLN: 500.0000 mL | INTRAVENOUS | Status: DC

## 2017-01-20 NOTE — Progress Notes (Signed)
Called to room to assist during endoscopic procedure.  Patient ID and intended procedure confirmed with present staff. Received instructions for my participation in the procedure from the performing physician.  

## 2017-01-20 NOTE — Patient Instructions (Addendum)
YOU HAD AN ENDOSCOPIC PROCEDURE TODAY AT THE [facility]:   Refer to the procedure report that was given to you for any specific questions about what was found during the examination.  If the procedure report does not answer your questions, please call your gastroenterologist to clarify.  If you requested that your care partner not be given the details of your procedure findings, then the procedure report has been included in a sealed envelope for you to review at your convenience later.  YOU SHOULD EXPECT: Some feelings of bloating in the abdomen. Passage of more gas than usual.  Walking can help get rid of the air that was put into your GI tract during the procedure and reduce the bloating. If you had a lower endoscopy (such as a colonoscopy or flexible sigmoidoscopy) you may notice spotting of blood in your stool or on the toilet paper. If you underwent a bowel prep for your procedure, you may not have a normal bowel movement for a few days.  Please Note:  You might notice some irritation and congestion in your nose or some drainage.  This is from the oxygen used during your procedure.  There is no need for concern and it should clear up in a day or so.  SYMPTOMS TO REPORT IMMEDIATELY:   Following lower endoscopy (colonoscopy or flexible sigmoidoscopy):  Excessive amounts of blood in the stool  Significant tenderness or worsening of abdominal pains  Swelling of the abdomen that is new, acute  Fever of 100F or higher    For urgent or emergent issues, a gastroenterologist can be reached at any hour by calling [phone].   DIET:  We do recommend a small meal at first, but then you may proceed to your regular diet.  Drink plenty of fluids but you should avoid alcoholic beverages for 24 hours.  ACTIVITY:  You should plan to take it easy for the rest of today and you should NOT DRIVE or use heavy machinery until tomorrow (because of the sedation medicines used during the test).     FOLLOW UP: Our staff will call the number listed on your records the next business day following your procedure to check on you and address any questions or concerns that you may have regarding the information given to you following your procedure. If we do not reach you, we will leave a message.  However, if you are feeling well and you are not experiencing any problems, there is no need to return our call.  We will assume that you have returned to your regular daily activities without incident.  If any biopsies were taken you will be contacted by phone or by letter within the next 1-3 weeks.  Please call us at [phone] if you have not heard about the biopsies in 3 weeks.    SIGNATURES/CONFIDENTIALITY: You and/or your care partner have signed paperwork which will be entered into your electronic medical record.  These signatures attest to the fact that that the information above on your After Visit Summary has been reviewed and is understood.  Full responsibility of the confidentiality of this discharge information lies with you and/or your care-partner.   Resume medications. Information given on polyps and diverticulosis. 

## 2017-01-20 NOTE — Progress Notes (Signed)
Report to PACU, RN, vss, BBS= Clear.  

## 2017-01-20 NOTE — Op Note (Signed)
[place] Patient Name: Linda Tran Procedure Date: 01/20/2017 8:44 AM MRN: [id number] Endoscopist: Ladene Artist , MD Age: 56 Referring MD:  Date of Birth: [birth year] Gender: Female Account #: [id number] Procedure:                Colonoscopy Indications:              Screening for colorectal malignant neoplasm Medicines:                Monitored Anesthesia Care Procedure:                Pre-Anesthesia Assessment:                           - Prior to the procedure, a History and Physical                            was performed, and patient medications and                            allergies were reviewed. The patient's tolerance of                            previous anesthesia was also reviewed. The risks                            and benefits of the procedure and the sedation                            options and risks were discussed with the patient.                            All questions were answered, and informed consent                            was obtained. Prior Anticoagulants: The patient has                            taken no previous anticoagulant or antiplatelet                            agents. ASA Grade Assessment: II - A patient with                            mild systemic disease. After reviewing the risks                            and benefits, the patient was deemed in                            satisfactory condition to undergo the procedure.                           After obtaining informed consent, the colonoscope  was passed under direct vision. Throughout the                            procedure, the patient's blood pressure, pulse, and                            oxygen saturations were monitored continuously. The                            Colonoscope was introduced through the anus and                            advanced to the the cecum, identified by                            appendiceal orifice and  ileocecal valve. The                            ileocecal valve, appendiceal orifice, and rectum                            were photographed. The quality of the bowel                            preparation was good. The colonoscopy was performed                            without difficulty. The patient tolerated the                            procedure well. Scope In: 9:03:52 AM Scope Out: 9:21:29 AM Scope Withdrawal Time: 0 hours 15 minutes 55 seconds  Total Procedure Duration: 0 hours 17 minutes 37 seconds  Findings:                 The perianal and digital rectal examinations were                            normal.                           Nine sessile polyps were found in the sigmoid colon                            (5) and ascending colon (4). The polyps were 5 to 7                            mm in size. These polyps were removed with a cold                            snare. Resection and retrieval were complete.                           Many medium-mouthed diverticula were found in the  left colon. There was no evidence of diverticular                            bleeding.                           Internal hemorrhoids were found during                            retroflexion. The hemorrhoids were medium-sized and                            Grade I (internal hemorrhoids that do not prolapse).                           The exam was otherwise without abnormality on                            direct and retroflexion views. Complications:            No immediate complications. Estimated blood loss:                            None. Estimated Blood Loss:     Estimated blood loss: none. Impression:               - Nine 5 to 7 mm polyps in the sigmoid colon and in                            the ascending colon, removed with a cold snare.                            Resected and retrieved.                           - Moderate diverticulosis in the left colon.  There                            was no evidence of diverticular bleeding.                           - Internal hemorrhoids.                           - The examination was otherwise normal on direct                            and retroflexion views. Recommendation:           - Repeat colonoscopy date to be determined after                            pending pathology results are reviewed for                            surveillance.                           -  Patient has a contact number available for                            emergencies. The signs and symptoms of potential                            delayed complications were discussed with the                            patient. Return to normal activities tomorrow.                            Written discharge instructions were provided to the                            patient.                           - Resume previous diet.                           - Continue present medications.                           - Await pathology results. Ladene Artist, MD 01/20/2017 9:29:11 AM This report has been signed electronically.

## 2017-01-21 ENCOUNTER — Telehealth: Payer: Self-pay

## 2017-01-21 ENCOUNTER — Telehealth: Payer: Self-pay | Admitting: *Deleted

## 2017-01-21 NOTE — Telephone Encounter (Signed)
  Follow up Call-  Call back number 01/20/2017  Post procedure Call Back phone  # [phone]  Permission to leave phone message Yes  Some recent data might be hidden     Patient questions:  Do you have a fever, pain , or abdominal swelling? No. Pain Score  0 *  Have you tolerated food without any problems? Yes.    Have you been able to return to your normal activities? Yes.    Do you have any questions about your discharge instructions: Diet   No. Medications  No. Follow up visit  No.  Do you have questions or concerns about your Care? No.  Actions: * If pain score is 4 or above: No action needed, pain <4.

## 2017-01-21 NOTE — Telephone Encounter (Signed)
Left message

## 2017-01-25 ENCOUNTER — Encounter: Payer: Self-pay | Admitting: Gastroenterology

## 2017-02-06 ENCOUNTER — Other Ambulatory Visit: Payer: Self-pay

## 2017-02-06 ENCOUNTER — Telehealth: Payer: Self-pay

## 2017-02-06 DIAGNOSIS — Z0000 Encounter for general adult medical examination without abnormal findings: Secondary | ICD-10-CM

## 2017-02-06 NOTE — Telephone Encounter (Signed)
-----   Message from Doreen Beam, [place] sent at 02/06/2017  8:08 AM EDT ----- See note and patient can come pick up handouts, she should follow back up with GI for specific instructions for her diagnosis. She needs to seek Primary care as soon as possible.   Thanks so much !

## 2017-02-06 NOTE — Telephone Encounter (Signed)
Spoke with pt, she states she will come to pick up packet on Monday. She was advised to go to the urgent care or emergency room for new or worsening symptoms. Pt was advised to contact her primary care physician for a physical and to follow up on anemia and a mammogram.

## 2017-02-06 NOTE — Progress Notes (Signed)
Patient was here for lab visit before providers arrival on 02/06/17. No provider appointment was scheduled. Patient asked Linda Tran about information regarding Diverticulitis as she was recently diagnosed with recent colonoscopy per her report.   Information sheets printed and patient will be called to pick up and schedule an appointment if further information is needed with her Gastrointestinal doctor as needed.   She also will be advised she needs to schedule an appointment with a primary care doctor for a complete physical this year  For routine health maintenance.  Patient will be advised she can follow up with this office for acute visits and provider will call her regarding anemia and CBC when it returns.   She can call the office at any time for an appointment and should go to the urgent care or emergency room if any new or changing symptoms develop after hours.  No visit with patient occurred today.

## 2017-02-06 NOTE — Patient Instructions (Signed)
Diverticulitis Diverticulitis is inflammation or infection of small pouches in your colon that form when you have a condition called diverticulosis. The pouches in your colon are called diverticula. Your colon, or large intestine, is where water is absorbed and stool is formed. Complications of diverticulitis can include:  Bleeding.  Severe infection.  Severe pain.  Perforation of your colon.  Obstruction of your colon.  What are the causes? Diverticulitis is caused by bacteria. Diverticulitis happens when stool becomes trapped in diverticula. This allows bacteria to grow in the diverticula, which can lead to inflammation and infection. What increases the risk? People with diverticulosis are at risk for diverticulitis. Eating a diet that does not include enough fiber from fruits and vegetables may make diverticulitis more likely to develop. What are the signs or symptoms? Symptoms of diverticulitis may include:  Abdominal pain and tenderness. The pain is normally located on the left side of the abdomen, but may occur in other areas.  Fever and chills.  Bloating.  Cramping.  Nausea.  Vomiting.  Constipation.  Diarrhea.  Blood in your stool.  How is this diagnosed? Your health care provider will ask you about your medical history and do a physical exam. You may need to have tests done because many medical conditions can cause the same symptoms as diverticulitis. Tests may include:  Blood tests.  Urine tests.  Imaging tests of the abdomen, including X-rays and CT scans.  When your condition is under control, your health care provider may recommend that you have a colonoscopy. A colonoscopy can show how severe your diverticula are and whether something else is causing your symptoms. How is this treated? Most cases of diverticulitis are mild and can be treated at home. Treatment may include:  Taking over-the-counter pain medicines.  Following a clear liquid  diet.  Taking antibiotic medicines by mouth for 7-10 days.  More severe cases may be treated at a hospital. Treatment may include:  Not eating or drinking.  Taking prescription pain medicine.  Receiving antibiotic medicines through an IV tube.  Receiving fluids and nutrition through an IV tube.  Surgery.  Follow these instructions at home:  Follow your health care provider's instructions carefully.  Follow a full liquid diet or other diet as directed by your health care provider. After your symptoms improve, your health care provider may tell you to change your diet. He or she may recommend you eat a high-fiber diet. Fruits and vegetables are good sources of fiber. Fiber makes it easier to pass stool.  Take fiber supplements or probiotics as directed by your health care provider.  Only take medicines as directed by your health care provider.  Keep all your follow-up appointments. Contact a health care provider if:  Your pain does not improve.  You have a hard time eating food.  Your bowel movements do not return to normal. Get help right away if:  Your pain becomes worse.  Your symptoms do not get better.  Your symptoms suddenly get worse.  You have a fever.  You have repeated vomiting.  You have bloody or black, tarry stools. This information is not intended to replace advice given to you by your health care provider. Make sure you discuss any questions you have with your health care provider. Document Released: 01/08/2005 Document Revised: 09/06/2015 Document Reviewed: 02/23/2013 Elsevier Interactive Patient Education  2017 [employer] Anemia, Nonspecific Anemia is a condition in which the concentration of red blood cells or hemoglobin in the blood is below normal.  Hemoglobin is a substance in red blood cells that carries oxygen to the tissues of the body. Anemia results in not enough oxygen reaching these tissues. What are the causes? Common causes of anemia  include:  Excessive bleeding. Bleeding may be internal or external. This includes excessive bleeding from periods (in women) or from the intestine.  Poor nutrition.  Chronic kidney, thyroid, and liver disease.  Bone marrow disorders that decrease red blood cell production.  Cancer and treatments for cancer.  HIV, AIDS, and their treatments.  Spleen problems that increase red blood cell destruction.  Blood disorders.  Excess destruction of red blood cells due to infection, medicines, and autoimmune disorders.  What are the signs or symptoms?  Minor weakness.  Dizziness.  Headache.  Palpitations.  Shortness of breath, especially with exercise.  Paleness.  Cold sensitivity.  Indigestion.  Nausea.  Difficulty sleeping.  Difficulty concentrating. Symptoms may occur suddenly or they may develop slowly. How is this diagnosed? Additional blood tests are often needed. These help your health care provider determine the best treatment. Your health care provider will check your stool for blood and look for other causes of blood loss. How is this treated? Treatment varies depending on the cause of the anemia. Treatment can include:  Supplements of iron, vitamin D63, or folic acid.  Hormone medicines.  A blood transfusion. This may be needed if blood loss is severe.  Hospitalization. This may be needed if there is significant continual blood loss.  Dietary changes.  Spleen removal.  Follow these instructions at home: Keep all follow-up appointments. It often takes many weeks to correct anemia, and having your health care provider check on your condition and your response to treatment is very important. Get help right away if:  You develop extreme weakness, shortness of breath, or chest pain.  You become dizzy or have trouble concentrating.  You develop heavy vaginal bleeding.  You develop a rash.  You have bloody or black, tarry stools.  You faint.  You  vomit up blood.  You vomit repeatedly.  You have abdominal pain.  You have a fever or persistent symptoms for more than 2-3 days.  You have a fever and your symptoms suddenly get worse.  You are dehydrated. This information is not intended to replace advice given to you by your health care provider. Make sure you discuss any questions you have with your health care provider. Document Released: 05/08/2004 Document Revised: 09/12/2015 Document Reviewed: 09/24/2012 Elsevier Interactive Patient Education  2017 [place] Maintenance, Female Adopting a healthy lifestyle and getting preventive care can go a long way to promote health and wellness. Talk with your health care provider about what schedule of regular examinations is right for you. This is a good chance for you to check in with your provider about disease prevention and staying healthy. In between checkups, there are plenty of things you can do on your own. Experts have done a lot of research about which lifestyle changes and preventive measures are most likely to keep you healthy. Ask your health care provider for more information. Weight and diet Eat a healthy diet  Be sure to include plenty of vegetables, fruits, low-fat dairy products, and lean protein.  Do not eat a lot of foods high in solid fats, added sugars, or salt.  Get regular exercise. This is one of the most important things you can do for your health. ? Most adults should exercise for at least 150 minutes each week. The  exercise should increase your heart rate and make you sweat (moderate-intensity exercise). ? Most adults should also do strengthening exercises at least twice a week. This is in addition to the moderate-intensity exercise.  Maintain a healthy weight  Body mass index (BMI) is a measurement that can be used to identify possible weight problems. It estimates body fat based on height and weight. Your health care provider can help determine  your BMI and help you achieve or maintain a healthy weight.  For females 60 years of age and older: ? A BMI below 18.5 is considered underweight. ? A BMI of 18.5 to 24.9 is normal. ? A BMI of 25 to 29.9 is considered overweight. ? A BMI of 30 and above is considered obese.  Watch levels of cholesterol and blood lipids  You should start having your blood tested for lipids and cholesterol at 54 years of age, then have this test every 5 years.  You may need to have your cholesterol levels checked more often if: ? Your lipid or cholesterol levels are high. ? You are older than 54 years of age. ? You are at high risk for heart disease.  Cancer screening Lung Cancer  Lung cancer screening is recommended for adults 1-76 years old who are at high risk for lung cancer because of a history of smoking.  A yearly low-dose CT scan of the lungs is recommended for people who: ? Currently smoke. ? Have quit within the past 15 years. ? Have at least a 30-pack-year history of smoking. A pack year is smoking an average of one pack of cigarettes a day for 1 year.  Yearly screening should continue until it has been 15 years since you quit.  Yearly screening should stop if you develop a health problem that would prevent you from having lung cancer treatment.  Breast Cancer  Practice breast self-awareness. This means understanding how your breasts normally appear and feel.  It also means doing regular breast self-exams. Let your health care provider know about any changes, no matter how small.  If you are in your 20s or 30s, you should have a clinical breast exam (CBE) by a health care provider every 1-3 years as part of a regular health exam.  If you are 53 or older, have a CBE every year. Also consider having a breast X-ray (mammogram) every year.  If you have a family history of breast cancer, talk to your health care provider about genetic screening.  If you are at high risk for breast  cancer, talk to your health care provider about having an MRI and a mammogram every year.  Breast cancer gene (BRCA) assessment is recommended for women who have family members with BRCA-related cancers. BRCA-related cancers include: ? Breast. ? Ovarian. ? Tubal. ? Peritoneal cancers.  Results of the assessment will determine the need for genetic counseling and BRCA1 and BRCA2 testing.  Cervical Cancer Your health care provider may recommend that you be screened regularly for cancer of the pelvic organs (ovaries, uterus, and vagina). This screening involves a pelvic examination, including checking for microscopic changes to the surface of your cervix (Pap test). You may be encouraged to have this screening done every 3 years, beginning at age 75.  For women ages 82-65, health care providers may recommend pelvic exams and Pap testing every 3 years, or they may recommend the Pap and pelvic exam, combined with testing for human papilloma virus (HPV), every 5 years. Some types of HPV increase your  risk of cervical cancer. Testing for HPV may also be done on women of any age with unclear Pap test results.  Other health care providers may not recommend any screening for nonpregnant women who are considered low risk for pelvic cancer and who do not have symptoms. Ask your health care provider if a screening pelvic exam is right for you.  If you have had past treatment for cervical cancer or a condition that could lead to cancer, you need Pap tests and screening for cancer for at least 20 years after your treatment. If Pap tests have been discontinued, your risk factors (such as having a new sexual partner) need to be reassessed to determine if screening should resume. Some women have medical problems that increase the chance of getting cervical cancer. In these cases, your health care provider may recommend more frequent screening and Pap tests.  Colorectal Cancer  This type of cancer can be detected  and often prevented.  Routine colorectal cancer screening usually begins at 54 years of age and continues through 54 years of age.  Your health care provider may recommend screening at an earlier age if you have risk factors for colon cancer.  Your health care provider may also recommend using home test kits to check for hidden blood in the stool.  A small camera at the end of a tube can be used to examine your colon directly (sigmoidoscopy or colonoscopy). This is done to check for the earliest forms of colorectal cancer.  Routine screening usually begins at age 27.  Direct examination of the colon should be repeated every 5-10 years through 54 years of age. However, you may need to be screened more often if early forms of precancerous polyps or small growths are found.  Skin Cancer  Check your skin from head to toe regularly.  Tell your health care provider about any new moles or changes in moles, especially if there is a change in a mole's shape or color.  Also tell your health care provider if you have a mole that is larger than the size of a pencil eraser.  Always use sunscreen. Apply sunscreen liberally and repeatedly throughout the day.  Protect yourself by wearing long sleeves, pants, a wide-brimmed hat, and sunglasses whenever you are outside.  Heart disease, diabetes, and high blood pressure  High blood pressure causes heart disease and increases the risk of stroke. High blood pressure is more likely to develop in: ? People who have blood pressure in the high end of the normal range (130-139/85-89 mm Hg). ? People who are overweight or obese. ? People who are African American.  If you are 54-57 years of age, have your blood pressure checked every 3-5 years. If you are 28 years of age or older, have your blood pressure checked every year. You should have your blood pressure measured twice-once when you are at a hospital or clinic, and once when you are not at a hospital or  clinic. Record the average of the two measurements. To check your blood pressure when you are not at a hospital or clinic, you can use: ? An automated blood pressure machine at a pharmacy. ? A home blood pressure monitor.  If you are between 80 years and 31 years old, ask your health care provider if you should take aspirin to prevent strokes.  Have regular diabetes screenings. This involves taking a blood sample to check your fasting blood sugar level. ? If you are at a normal weight  and have a low risk for diabetes, have this test once every three years after 54 years of age. ? If you are overweight and have a high risk for diabetes, consider being tested at a younger age or more often. Preventing infection Hepatitis B  If you have a higher risk for hepatitis B, you should be screened for this virus. You are considered at high risk for hepatitis B if: ? You were born in a country where hepatitis B is common. Ask your health care provider which countries are considered high risk. ? Your parents were born in a high-risk country, and you have not been immunized against hepatitis B (hepatitis B vaccine). ? You have HIV or AIDS. ? You use needles to inject street drugs. ? You live with someone who has hepatitis B. ? You have had sex with someone who has hepatitis B. ? You get hemodialysis treatment. ? You take certain medicines for conditions, including cancer, organ transplantation, and autoimmune conditions.  Hepatitis C  Blood testing is recommended for: ? Everyone born from [birth year] through 1963. ? Anyone with known risk factors for hepatitis C.  Sexually transmitted infections (STIs)  You should be screened for sexually transmitted infections (STIs) including gonorrhea and chlamydia if: ? You are sexually active and are younger than 54 years of age. ? You are older than 54 years of age and your health care provider tells you that you are at risk for this type of infection. ? Your  sexual activity has changed since you were last screened and you are at an increased risk for chlamydia or gonorrhea. Ask your health care provider if you are at risk.  If you do not have HIV, but are at risk, it may be recommended that you take a prescription medicine daily to prevent HIV infection. This is called pre-exposure prophylaxis (PrEP). You are considered at risk if: ? You are sexually active and do not regularly use condoms or know the HIV status of your partner(s). ? You take drugs by injection. ? You are sexually active with a partner who has HIV.  Talk with your health care provider about whether you are at high risk of being infected with HIV. If you choose to begin PrEP, you should first be tested for HIV. You should then be tested every 3 months for as long as you are taking PrEP. Pregnancy  If you are premenopausal and you may become pregnant, ask your health care provider about preconception counseling.  If you may become pregnant, take 400 to 800 micrograms (mcg) of folic acid every day.  If you want to prevent pregnancy, talk to your health care provider about birth control (contraception). Osteoporosis and menopause  Osteoporosis is a disease in which the bones lose minerals and strength with aging. This can result in serious bone fractures. Your risk for osteoporosis can be identified using a bone density scan.  If you are 46 years of age or older, or if you are at risk for osteoporosis and fractures, ask your health care provider if you should be screened.  Ask your health care provider whether you should take a calcium or vitamin D supplement to lower your risk for osteoporosis.  Menopause may have certain physical symptoms and risks.  Hormone replacement therapy may reduce some of these symptoms and risks. Talk to your health care provider about whether hormone replacement therapy is right for you. Follow these instructions at home:  Schedule regular health,  dental, and eye exams.  Stay current with your immunizations.  Do not use any tobacco products including cigarettes, chewing tobacco, or electronic cigarettes.  If you are pregnant, do not drink alcohol.  If you are breastfeeding, limit how much and how often you drink alcohol.  Limit alcohol intake to no more than 1 drink per day for nonpregnant women. One drink equals 12 ounces of beer, 5 ounces of wine, or 1 ounces of hard liquor.  Do not use street drugs.  Do not share needles.  Ask your health care provider for help if you need support or information about quitting drugs.  Tell your health care provider if you often feel depressed.  Tell your health care provider if you have ever been abused or do not feel safe at home. This information is not intended to replace advice given to you by your health care provider. Make sure you discuss any questions you have with your health care provider. Document Released: 10/14/2010 Document Revised: 09/06/2015 Document Reviewed: 01/02/2015 Elsevier Interactive Patient Education  [employer].

## 2017-02-06 NOTE — Telephone Encounter (Signed)
Called pt to relay information regarding diverticulitis. There are handouts here at the clinic for her to pick up regarding this diagnosis.   Pt requested a mammogram this morning during labs- I informed pt that I would call her back this afternoon. Pt will need to see her primary care provider to have that procedure ordered.

## 2017-02-07 LAB — CBC WITH DIFFERENTIAL/PLATELET
BASOS ABS: 0.1 10*3/uL (ref 0.0–0.2)
Basos: 1 %
EOS (ABSOLUTE): 0.2 10*3/uL (ref 0.0–0.4)
Eos: 4 %
HEMATOCRIT: 38.6 % (ref 34.0–46.6)
Hemoglobin: 12.5 g/dL (ref 11.1–15.9)
IMMATURE GRANULOCYTES: 0 %
Immature Grans (Abs): 0 10*3/uL (ref 0.0–0.1)
LYMPHS ABS: 1.9 10*3/uL (ref 0.7–3.1)
Lymphs: 34 %
MCH: 26.8 pg (ref 26.6–33.0)
MCHC: 32.4 g/dL (ref 31.5–35.7)
MCV: 83 fL (ref 79–97)
MONOS ABS: 0.3 10*3/uL (ref 0.1–0.9)
Monocytes: 5 %
NEUTROS PCT: 56 %
Neutrophils Absolute: 3.1 10*3/uL (ref 1.4–7.0)
PLATELETS: 311 10*3/uL (ref 150–379)
RBC: 4.66 x10E6/uL (ref 3.77–5.28)
RDW: 17.5 % — AB (ref 12.3–15.4)
WBC: 5.6 10*3/uL (ref 3.4–10.8)

## 2017-02-07 LAB — IRON AND TIBC
IRON: 53 ug/dL (ref 27–159)
Iron Saturation: 14 % — ABNORMAL LOW (ref 15–55)
Total Iron Binding Capacity: 391 ug/dL (ref 250–450)
UIBC: 338 ug/dL (ref 131–425)

## 2017-02-09 ENCOUNTER — Encounter: Payer: Self-pay | Admitting: Medical

## 2017-02-09 ENCOUNTER — Ambulatory Visit: Payer: Self-pay | Admitting: Medical

## 2017-02-09 VITALS — BP 108/72 | HR 79 | Temp 98.9°F | Resp 16 | Ht 62.0 in | Wt 133.0 lb

## 2017-02-09 DIAGNOSIS — L089 Local infection of the skin and subcutaneous tissue, unspecified: Secondary | ICD-10-CM

## 2017-02-09 DIAGNOSIS — H00012 Hordeolum externum right lower eyelid: Secondary | ICD-10-CM

## 2017-02-09 MED ORDER — CEPHALEXIN 500 MG PO CAPS: 500.0000 mg | ORAL_CAPSULE | Freq: Three times a day (TID) | ORAL | 0 refills | Status: DC

## 2017-02-09 MED ORDER — GENTAMICIN SULFATE 0.3 % OP SOLN: 1.0000 [drp] | Freq: Three times a day (TID) | OPHTHALMIC | 0 refills | Status: DC

## 2017-02-09 NOTE — Progress Notes (Signed)
   Subjective:    Patient ID: Linda Tran, female    DOB: [birth year], 56 y.o.   MRN: [id number]  HPI  Started Saturday morning with  Right eye redness, and swelling and burning , on the lower lid. Denies visual changes, some sensitivity to llight. Denies headaches.She states it looked a lot worse on Saturday  Review of Systems  Constitutional: Negative for chills and fever.  HENT: Positive for congestion. Negative for ear pain and sore throat.   Eyes: Positive for photophobia, discharge and redness. Negative for pain, itching and visual disturbance.  Respiratory: Negative for cough and shortness of breath.   Cardiovascular: Negative for chest pain.  Gastrointestinal: Negative for abdominal pain.  Genitourinary: Negative for dysuria.  Musculoskeletal: Negative for myalgias.  Skin: Negative for rash.  Allergic/Immunologic: Positive for environmental allergies and food allergies.  Neurological: Negative for dizziness, syncope and headaches.  Hematological: Negative for adenopathy.  Psychiatric/Behavioral: Negative for behavioral problems, self-injury and suicidal ideas. The patient is not nervous/anxious.        Objective:   Physical Exam  Constitutional: She appears well-developed and well-nourished.  Eyes: Pupils are equal, round, and reactive to light. Conjunctivae, EOM and lids are normal. Right eye exhibits hordeolum. Right eye exhibits no discharge and no exudate. Left eye exhibits no discharge. Right conjunctiva is not injected. Right conjunctiva has no hemorrhage. No scleral icterus. Right eye exhibits no nystagmus.  Fundoscopic exam:      The right eye shows red reflex.       The left eye shows red reflex.   Able to tolerate fundascope light without difficulty   Red lower lid skin with mild swelling noted.. Stye noted on the center of the lower lid edge. No discharge noted. Can see stye on conjunctiva as well.    Assessment & Plan:  Skin Infection lower lid right And  right stye located center of lower lid.. Meds ordered this encounter  Medications  . gentamicin (GARAMYCIN) 0.3 % ophthalmic solution    Sig: Place 1 drop into the right eye 3 (three) times daily. X 7 days    Dispense:  5 mL    Refill:  0  . cephALEXin (KEFLEX) 500 MG capsule    Sig: Take 1 capsule (500 mg total) by mouth 3 (three) times daily. Take with food    Dispense:  21 capsule    Refill:  0  return to clinic in  3 days if not improving , sooner if worsening. May apply warm compresses and cool compresses to the area to help with swelling.  Patient verbalizes understanding and has no questions at discharge.

## 2017-02-09 NOTE — Patient Instructions (Signed)
Cellulitis, Adult Cellulitis is a skin infection. The infected area is usually red and sore. This condition occurs most often in the arms and lower legs. It is very important to get treated for this condition. Follow these instructions at home:  Take over-the-counter and prescription medicines only as told by your doctor.  If you were prescribed an antibiotic medicine, take it as told by your doctor. Do not stop taking the antibiotic even if you start to feel better.  Drink enough fluid to keep your pee (urine) clear or pale yellow.  Do not touch or rub the infected area.  Raise (elevate) the infected area above the level of your heart while you are sitting or lying down.  Place warm or cold wet cloths (warm or cold compresses) on the infected area. Do this as told by your doctor.  Keep all follow-up visits as told by your doctor. This is important. These visits let your doctor make sure your infection is not getting worse. Contact a doctor if:  You have a fever.  Your symptoms do not get better after 1-2 days of treatment.  Your bone or joint under the infected area starts to hurt after the skin has healed.  Your infection comes back. This can happen in the same area or another area.  You have a swollen bump in the infected area.  You have new symptoms.  You feel ill and also have muscle aches and pains. Get help right away if:  Your symptoms get worse.  You feel very sleepy.  You throw up (vomit) or have watery poop (diarrhea) for a long time.  There are red streaks coming from the infected area.  Your red area gets larger.  Your red area turns darker. This information is not intended to replace advice given to you by your health care provider. Make sure you discuss any questions you have with your health care provider. Document Released: 09/17/2007 Document Revised: 09/06/2015 Document Reviewed: 02/07/2015 Elsevier Interactive Patient Education  2018 [place] is a bump on your eyelid caused by a bacterial infection. A stye can form inside the eyelid (internal stye) or outside the eyelid (external stye). An internal stye may be caused by an infected oil-producing gland inside your eyelid. An external stye may be caused by an infection at the base of your eyelash (hair follicle). Styes are very common. Anyone can get them at any age. They usually occur in just one eye, but you may have more than one in either eye. What are the causes? The infection is almost always caused by bacteria called Staphylococcus aureus. This is a common type of bacteria that lives on your skin. What increases the risk? You may be at higher risk for a stye if you have had one before. You may also be at higher risk if you have:  Diabetes.  Long-term illness.  Long-term eye redness.  A skin condition called seborrhea.  High fat levels in your blood (lipids).  What are the signs or symptoms? Eyelid pain is the most common symptom of a stye. Internal styes are more painful than external styes. Other signs and symptoms may include:  Painful swelling of your eyelid.  A scratchy feeling in your eye.  Tearing and redness of your eye.  Pus draining from the stye.  How is this diagnosed? Your health care provider may be able to diagnose a stye just by examining your eye. The health care provider may also check to  make sure:  You do not have a fever or other signs of a more serious infection.  The infection has not spread to other parts of your eye or areas around your eye.  How is this treated? Most styes will clear up in a few days without treatment. In some cases, you may need to use antibiotic drops or ointment to prevent infection. Your health care provider may have to drain the stye surgically if your stye is:  Large.  Causing a lot of pain.  Interfering with your vision.  This can be done using a thin blade or a needle. Follow these  instructions at home:  Take medicines only as directed by your health care provider.  Apply a clean, warm compress to your eye for 10 minutes, 4 times a day.  Do not wear contact lenses or eye makeup until your stye has healed.  Do not try to pop or drain the stye. Contact a health care provider if:  You have chills or a fever.  Your stye does not go away after several days.  Your stye affects your vision.  Your eyeball becomes swollen, red, or painful. This information is not intended to replace advice given to you by your health care provider. Make sure you discuss any questions you have with your health care provider. Document Released: 01/08/2005 Document Revised: 11/25/2015 Document Reviewed: 07/15/2013 Elsevier Interactive Patient Education  [employer].

## 2017-02-11 ENCOUNTER — Telehealth: Payer: Self-pay | Admitting: Adult Health

## 2017-02-11 DIAGNOSIS — D649 Anemia, unspecified: Secondary | ICD-10-CM

## 2017-02-11 DIAGNOSIS — Z789 Other specified health status: Secondary | ICD-10-CM

## 2017-02-11 MED ORDER — IRON-VITAMIN C 65-125 MG PO TABS: 65.0000 mg | ORAL_TABLET | Freq: Every day | ORAL | 0 refills | Status: DC

## 2017-02-11 MED ORDER — VITRON-C 65-125 MG PO TABS: 65.0000 mg | ORAL_TABLET | Freq: Two times a day (BID) | ORAL | 2 refills | Status: DC

## 2017-02-11 NOTE — Telephone Encounter (Addendum)
Patient returned call 05/26/2016 Discussed labs as below. Minor irregularities on labs noted however  Much improved from previous labs. Anemia improved. She denied any rectal/ vaginal or oral bleeding. She did go for colonoscopy with Dr. Kennedy Bucker and reports she had 7 polyps removed and was advised to return in 5 years for repeat colonoscopy. She denies any constipation with iron intake. She will  Decrease  Current iron dosage to once daily now and new script will be sent to pharmacy and lab  recheck in 3 months. Patient verbalized understanding of instructions and denies any further questions at this time.   She reports she is calling [facility] for a primary care appointment for a complete physical within the next two weeks.  She is aware and verbalizes understanding that this office is no longer accepting any primary care patients.   She also reports she has questions on her biopsy report from her colonoscopy- she has seen Dr. Kennedy Bucker for colonoscopy and is advised to call his office for follow up and any questions regarding her procedure with him. She also had asked lab technician last week for information regarding diverticulitis and she never came to pick up education handout but states on phone today she was not told she has diverticulitis but diverticulosis she is advised to also follow up with Dr. Fuller Plan regarding diagnoses she was given. She denies any current symptoms or concerns and voices she will call Dr. Fuller Plan.  Patient verbalized understanding of instructions and denies any further questions at this time.   Patient  was advised to call for a return appointment with the  clinic at any time  if any new symptoms change, worsen or do not improve. You should call for an appointment at the clinic if not closed or be seen in urgent care/ED if clinic is closed. Follow up with your PCP as soon as possible.  Patient verbalized above understanding of all instructions and denies any  questions currently.     Recent Results (from the past 2160 hour(s))  CBC with Differential/Platelet     Status: Abnormal   Collection Time: 02/06/17  7:35 AM  Result Value Ref Range   WBC 5.6 3.4 - 10.8 x10E3/uL   RBC 4.66 3.77 - 5.28 x10E6/uL   Hemoglobin 12.5 11.1 - 15.9 g/dL   Hematocrit 38.6 34.0 - 46.6 %   MCV 83 79 - 97 fL   MCH 26.8 26.6 - 33.0 pg   MCHC 32.4 31.5 - 35.7 g/dL   RDW 17.5 (H) 12.3 - 15.4 %   Platelets 311 150 - 379 x10E3/uL   Neutrophils 56 Not Estab. %   Lymphs 34 Not Estab. %   Monocytes 5 Not Estab. %   Eos 4 Not Estab. %   Basos 1 Not Estab. %   Neutrophils Absolute 3.1 1.4 - 7.0 x10E3/uL   Lymphocytes Absolute 1.9 0.7 - 3.1 x10E3/uL   Monocytes Absolute 0.3 0.1 - 0.9 x10E3/uL   EOS (ABSOLUTE) 0.2 0.0 - 0.4 x10E3/uL   Basophils Absolute 0.1 0.0 - 0.2 x10E3/uL   Immature Granulocytes 0 Not Estab. %   Immature Grans (Abs) 0.0 0.0 - 0.1 x10E3/uL  Iron and TIBC     Status: Abnormal   Collection Time: 02/06/17  7:35 AM  Result Value Ref Range   Total Iron Binding Capacity 391 250 - 450 ug/dL   UIBC 338 131 - 425 ug/dL   Iron 53 27 - 159 ug/dL   Iron Saturation 14 (  L) 15 - 55 %   I have already ordered this as a future order in the computer. Orders Placed This Encounter  Procedures  . CBC w/Diff    Standing Status:   Future    Standing Expiration Date:   06/11/2017  . Iron, TIBC and Ferritin Panel    Standing Status:   Future    Standing Expiration Date:   06/11/2017   Expected 05/14/16 for labs   Spoke with [facility] technician 02/11/17  to discontinue med below and sent over new script as below- E- prescribed.   Meds ordered this encounter  Medications  . DISCONTD: VITRON-C 65-125 MG TABS    Sig: Take 65-125 mg by mouth 2 (two) times daily. Recheck labs in 4 weeks, call for lab appointment    Dispense:  60 tablet    Refill:  2    Repeat labs three months - around 05/14/17  . Iron-Vitamin C (VITRON-C) 65-125 MG TABS    Sig: Take  65-125 mg by mouth daily. Recheck labs in 3 months , call for lab appointment    Dispense:  90 tablet    Refill:  0    Repeat labs three months - around 05/14/17    Patient verbalized understanding of instructions and denies any further questions at this time.  Discussed above plan with supervising physician Dr. Miguel Aschoff who is in agreement with the care plan as above.

## 2017-02-13 ENCOUNTER — Ambulatory Visit: Payer: Self-pay | Admitting: Adult Health

## 2017-02-13 VITALS — BP 106/68 | HR 95 | Temp 98.0°F | Resp 18 | Wt 130.0 lb

## 2017-02-13 DIAGNOSIS — H00012 Hordeolum externum right lower eyelid: Secondary | ICD-10-CM

## 2017-02-13 NOTE — Progress Notes (Signed)
Subjective:     Patient ID: Linda Tran, female   DOB: August 10, 1960, 56 y.o.   MRN: [id number]  HPI Patient is a 56 year old female in no acute distress who presents for a recheck of her right eye hordeolum. She was seen on 02/09/17.   She has been taking medications as directed below for her right eye. She reports the ":sty drained and the pus came out, and it is no longer hard". Photophobia and swelling have resolved per patient and she states  " my eye is much better than last visit". She denies any vision changes.   She denies any trauma, irritant or any injury to the eye.  She has no other concerns at today's visit.  She reports an upcoming eye exam in 2 weeks.    Current Outpatient Prescriptions:  .  cephALEXin (KEFLEX) 500 MG capsule, Take 1 capsule (500 mg total) by mouth 3 (three) times daily. Take with food, Disp: 21 capsule, Rfl: 0 .  fluticasone (FLONASE) 50 MCG/ACT nasal spray, Place into both nostrils daily., Disp: , Rfl:  .  gentamicin (GARAMYCIN) 0.3 % ophthalmic solution, Place 1 drop into the right eye 3 (three) times daily. X 7 days, Disp: 5 mL, Rfl: 0 .  Iron-Vitamin C (VITRON-C) 65-125 MG TABS, Take 65-125 mg by mouth daily. Recheck labs in 3 months , call for lab appointment, Disp: 90 tablet, Rfl: 0 Review of Systems  HENT: Negative.   Eyes: Negative for photophobia (resolved since last visit. ), pain, discharge (she did have pus like discharge from lower eyelid and swelling that has now resolved with treatment. ), redness, itching and visual disturbance.       Very small area of redness inside the lower inner eyelid of right eye, she reports this is much better than previously. She denies any pain, discharge, or increase in size.   Respiratory: Negative.   Cardiovascular: Negative.   Genitourinary: Negative.   Musculoskeletal: Negative.   Skin: Negative.   Neurological: Negative.   Hematological: Negative.   Psychiatric/Behavioral: Negative.        Objective:    Physical Exam  Constitutional: She is oriented to person, place, and time. She appears well-developed and well-nourished. No distress.  HENT:  Head: Normocephalic and atraumatic.  Eyes: Pupils are equal, round, and reactive to light. Conjunctivae and lids are normal. Right eye exhibits no discharge. Left eye exhibits no discharge. Right conjunctiva is not injected. Right conjunctiva has no hemorrhage. Left conjunctiva is not injected. Left conjunctiva has no hemorrhage. No scleral icterus. Right eye exhibits normal extraocular motion. Left eye exhibits normal extraocular motion.  Red reflex present bilateral eyes/ No foreign body visualized.   Neck: Normal range of motion. Neck supple.  Cardiovascular: Normal rate, regular rhythm, normal heart sounds and intact distal pulses.  Exam reveals no gallop and no friction rub.   No murmur heard. Pulmonary/Chest: Effort normal and breath sounds normal. No respiratory distress. She has no wheezes. She has no rales. She exhibits no tenderness.  Abdominal: Soft.  Musculoskeletal: Normal range of motion.  Neurological: She is alert and oriented to person, place, and time. She has normal reflexes.  Skin: Skin is warm and dry. No rash noted. She is not diaphoretic. No erythema. No pallor.  Psychiatric: She has a normal mood and affect. Her speech is normal and behavior is normal. Judgment and thought content normal. Cognition and memory are normal.       Assessment:     Hordeolum  externum of right lower eyelid  resolving since previous visit       Plan:                                                                                                                                                     Continue currently prescribed medications as prescribed since symptoms are resolving.  Will have recheck on 02/16/17 and go to the urgent care or emergency room if symptoms change or worsen or any new symptoms develop.  Return to clinic at any time  if  any new symptoms change, worsen or do not improve. Symptoms should improve  within 72 hours and if not improving you should call for an appointment at the clinic or be seen in urgent care/ED if clinic is closed. Your symptoms should not get worse from this point forward and if they do seek immediate medical attention.  Patient verbalized understanding of instructions and denies any further questions at this time.

## 2017-02-13 NOTE — Patient Instructions (Signed)

## 2017-02-17 ENCOUNTER — Encounter: Payer: Self-pay | Admitting: Medical

## 2017-02-17 ENCOUNTER — Ambulatory Visit: Payer: Self-pay | Admitting: Medical

## 2017-02-17 VITALS — BP 114/70 | HR 87 | Temp 97.7°F | Resp 16

## 2017-02-17 DIAGNOSIS — H00012 Hordeolum externum right lower eyelid: Secondary | ICD-10-CM

## 2017-02-17 DIAGNOSIS — J988 Other specified respiratory disorders: Secondary | ICD-10-CM

## 2017-02-17 DIAGNOSIS — B9789 Other viral agents as the cause of diseases classified elsewhere: Secondary | ICD-10-CM

## 2017-02-17 DIAGNOSIS — Z716 Tobacco abuse counseling: Secondary | ICD-10-CM

## 2017-02-17 MED ORDER — VARENICLINE TARTRATE 0.5 MG X 11 & 1 MG X 42 PO MISC: ORAL | 0 refills | Status: DC

## 2017-02-17 NOTE — Patient Instructions (Signed)
Return to the clinc as needed. Follow up in one month or sooner if needed for smoking cessation.   Stye A stye is a bump on your eyelid caused by a bacterial infection. A stye can form inside the eyelid (internal stye) or outside the eyelid (external stye). An internal stye may be caused by an infected oil-producing gland inside your eyelid. An external stye may be caused by an infection at the base of your eyelash (hair follicle). Styes are very common. Anyone can get them at any age. They usually occur in just one eye, but you may have more than one in either eye. What are the causes? The infection is almost always caused by bacteria called Staphylococcus aureus. This is a common type of bacteria that lives on your skin. What increases the risk? You may be at higher risk for a stye if you have had one before. You may also be at higher risk if you have:  Diabetes.  Long-term illness.  Long-term eye redness.  A skin condition called seborrhea.  High fat levels in your blood (lipids).  What are the signs or symptoms? Eyelid pain is the most common symptom of a stye. Internal styes are more painful than external styes. Other signs and symptoms may include:  Painful swelling of your eyelid.  A scratchy feeling in your eye.  Tearing and redness of your eye.  Pus draining from the stye.  How is this diagnosed? Your health care provider may be able to diagnose a stye just by examining your eye. The health care provider may also check to make sure:  You do not have a fever or other signs of a more serious infection.  The infection has not spread to other parts of your eye or areas around your eye.  How is this treated? Most styes will clear up in a few days without treatment. In some cases, you may need to use antibiotic drops or ointment to prevent infection. Your health care provider may have to drain the stye surgically if your stye is:  Large.  Causing a lot of  pain.  Interfering with your vision.  This can be done using a thin blade or a needle. Follow these instructions at home:  Take medicines only as directed by your health care provider.  Apply a clean, warm compress to your eye for 10 minutes, 4 times a day.  Do not wear contact lenses or eye makeup until your stye has healed.  Do not try to pop or drain the stye. Contact a health care provider if:  You have chills or a fever.  Your stye does not go away after several days.  Your stye affects your vision.  Your eyeball becomes swollen, red, or painful. This information is not intended to replace advice given to you by your health care provider. Make sure you discuss any questions you have with your health care provider. Document Released: 01/08/2005 Document Revised: 11/25/2015 Document Reviewed: 07/15/2013 Elsevier Interactive Patient Education  2018 [place] with Quitting Smoking Quitting smoking is a physical and mental challenge. You will face cravings, withdrawal symptoms, and temptation. Before quitting, work with your health care provider to make a plan that can help you cope. Preparation can help you quit and keep you from giving in. How can I cope with cravings? Cravings usually last for 5-10 minutes. If you get through it, the craving will pass. Consider taking the following actions to help you cope with cravings:  Keep  your mouth busy: ? Chew sugar-free gum. ? Suck on hard candies or a straw. ? Brush your teeth.  Keep your hands and body busy: ? Immediately change to a different activity when you feel a craving. ? Squeeze or play with a ball. ? Do an activity or a hobby, like making bead jewelry, practicing needlepoint, or working with wood. ? Mix up your normal routine. ? Take a short exercise break. Go for a quick walk or run up and down stairs. ? Spend time in public places where smoking is not allowed.  Focus on doing something kind or helpful  for someone else.  Call a friend or family member to talk during a craving.  Join a support group.  Call a quit line, such as 1-800-QUIT-NOW.  Talk with your health care provider about medicines that might help you cope with cravings and make quitting easier for you.  How can I deal with withdrawal symptoms? Your body may experience negative effects as it tries to get used to not having nicotine in the system. These effects are called withdrawal symptoms. They may include:  Feeling hungrier than normal.  Trouble concentrating.  Irritability.  Trouble sleeping.  Feeling depressed.  Restlessness and agitation.  Craving a cigarette.  To manage withdrawal symptoms:  Avoid places, people, and activities that trigger your cravings.  Remember why you want to quit.  Get plenty of sleep.  Avoid coffee and other caffeinated drinks. These may worsen some of your symptoms.  How can I handle social situations? Social situations can be difficult when you are quitting smoking, especially in the first few weeks. To manage this, you can:  Avoid parties, bars, and other social situations where people might be smoking.  Avoid alcohol.  Leave right away if you have the urge to smoke.  Explain to your family and friends that you are quitting smoking. Ask for understanding and support.  Plan activities with friends or family where smoking is not an option.  What are some ways I can cope with stress? Wanting to smoke may cause stress, and stress can make you want to smoke. Find ways to manage your stress. Relaxation techniques can help. For example:  Breathe slowly and deeply, in through your nose and out through your mouth.  Listen to soothing, relaxing music.  Talk with a family member or friend about your stress.  Light a candle.  Soak in a bath or take a shower.  Think about a peaceful place.  What are some ways I can prevent weight gain? Be aware that many people gain  weight after they quit smoking. However, not everyone does. To keep from gaining weight, have a plan in place before you quit and stick to the plan after you quit. Your plan should include:  Having healthy snacks. When you have a craving, it may help to: ? Eat plain popcorn, crunchy carrots, celery, or other cut vegetables. ? Chew sugar-free gum.  Changing how you eat: ? Eat small portion sizes at meals. ? Eat 4-6 small meals throughout the day instead of 1-2 large meals a day. ? Be mindful when you eat. Do not watch television or do other things that might distract you as you eat.  Exercising regularly: ? Make time to exercise each day. If you do not have time for a long workout, do short bouts of exercise for 5-10 minutes several times a day. ? Do some form of strengthening exercise, like weight lifting, and some form of  aerobic exercise, like running or swimming.  Drinking plenty of water or other low-calorie or no-calorie drinks. Drink 6-8 glasses of water daily, or as much as instructed by your health care provider.  Summary  Quitting smoking is a physical and mental challenge. You will face cravings, withdrawal symptoms, and temptation to smoke again. Preparation can help you as you go through these challenges.  You can cope with cravings by keeping your mouth busy (such as by chewing gum), keeping your body and hands busy, and making calls to family, friends, or a helpline for people who want to quit smoking.  You can cope with withdrawal symptoms by avoiding places where people smoke, avoiding drinks with caffeine, and getting plenty of rest.  Ask your health care provider about the different ways to prevent weight gain, avoid stress, and handle social situations. This information is not intended to replace advice given to you by your health care provider. Make sure you discuss any questions you have with your health care provider. Document Released: 03/28/2016 Document Revised:  03/28/2016 Document Reviewed: 03/28/2016 Elsevier Interactive Patient Education  [employer].

## 2017-02-17 NOTE — Progress Notes (Signed)
   Subjective:    Patient ID: Linda Tran, female    DOB: 02-26-1961, 56 y.o.   MRN: [id number]  HPI  Here for recheck of right eye stye and lower lid infection. Skin with no swelling or redness,  Feels normal per patient. No visual changes.  On Last day of antibiotics. Would also like to talk about smoking cessation.  Review of Systems  Constitutional: Negative for chills and fever.  HENT: Positive for congestion (has allergies using flonase), postnasal drip and sneezing. Negative for sinus pressure, sinus pain, sore throat and trouble swallowing.   Eyes: Negative for photophobia, pain, discharge, redness, itching and visual disturbance.  Respiratory: Positive for cough (slight, productive clear). Negative for shortness of breath.   Cardiovascular: Negative for chest pain, palpitations and leg swelling.  Gastrointestinal: Negative for abdominal pain.  Endocrine: Negative for cold intolerance and heat intolerance.  Genitourinary: Negative for dysuria.  Musculoskeletal: Negative for myalgias.  Skin: Negative for rash.  Allergic/Immunologic: Positive for environmental allergies. Negative for food allergies.  Neurological: Negative for dizziness, syncope and light-headedness.  Hematological: Negative for adenopathy.  Psychiatric/Behavioral: Negative for behavioral problems, sleep disturbance and suicidal ideas. The patient is not nervous/anxious.        Objective:   Physical Exam  Constitutional: She is oriented to person, place, and time.  Cardiovascular: Normal rate, regular rhythm and normal heart sounds.  Pulmonary/Chest: Effort normal.  Neurological: She is alert and oriented to person, place, and time.  Skin: Skin is warm and dry.  Psychiatric: She has a normal mood and affect. Her behavior is normal. Judgment and thought content normal.    Mild rhonchi on the left mid (on antibiotics) Cough is productive clear.no wheezing Still tiny area where stye is healing on right eye  lower lid, centrer of  conjunctiva..  No erythema  Or swelling to the outside of right  lower lid.     Assessment & Plan:  Stye healing, infection of lower lid resolved. Viral upper respiratory infection if sputum changes color or fever occurs she is to follow up in the clinic..  Prescribed Chantix starter pak, reviewed side effects and how to take medication.If she thinks of questions or has any concerns, she may come into the clinic  or call.   ENT for  decreased smelling will call for appointment, she has seen [facility] before.She also questioned about bone density testing because her sister who is 46 years older has osteoporosis,  Referred her to her family doctor. She currently does not have one so I suggested [facility],. She also is trying [facility] (She has the name of a nurse there and is possibly going to try to set up care there.She verbalizes understanding and has no questions at discharge.

## 2017-02-18 ENCOUNTER — Telehealth: Payer: Self-pay

## 2017-02-18 NOTE — Telephone Encounter (Signed)
Called [facility] in to [place] after receiving V.O from [employer]; pt aware

## 2017-02-23 ENCOUNTER — Telehealth: Payer: Self-pay | Admitting: Medical

## 2017-02-23 ENCOUNTER — Telehealth: Payer: Self-pay

## 2017-02-23 MED ORDER — FLUCONAZOLE 150 MG PO TABS: 150.0000 mg | ORAL_TABLET | Freq: Once | ORAL | 0 refills | Status: AC

## 2017-02-23 NOTE — Telephone Encounter (Signed)
Patient called and says she has vaginal itching since taking keflex, tried Aand D ointment with no relief.  Will prescribe Diflucan pill #1 single dose and sent to her pharmacy in [place].

## 2017-02-23 NOTE — Telephone Encounter (Signed)
Patient called Linda Tran and said she was suffering from yeast infection due to antibiotic use.  Requested that provider call in prescription to [facility].  I spoke to [facility] and she was willing to send in a rx for Diflucan once dose for her.  Called Linda Tran to let her know presciption for Diflucan sent in to [facility].

## 2017-03-12 ENCOUNTER — Ambulatory Visit: Payer: Self-pay | Admitting: Adult Health

## 2017-03-12 ENCOUNTER — Encounter: Payer: Self-pay | Admitting: Adult Health

## 2017-03-12 VITALS — BP 110/62 | HR 96 | Temp 98.9°F | Resp 16 | Ht 62.0 in | Wt 132.0 lb

## 2017-03-12 DIAGNOSIS — S6992XS Unspecified injury of left wrist, hand and finger(s), sequela: Secondary | ICD-10-CM | POA: Insufficient documentation

## 2017-03-12 DIAGNOSIS — M79642 Pain in left hand: Secondary | ICD-10-CM

## 2017-03-12 DIAGNOSIS — M25642 Stiffness of left hand, not elsewhere classified: Secondary | ICD-10-CM

## 2017-03-12 DIAGNOSIS — M6530 Trigger finger, unspecified finger: Secondary | ICD-10-CM

## 2017-03-12 MED ORDER — PREDNISONE 10 MG PO TABS: 10.0000 mg | ORAL_TABLET | Freq: Every day | ORAL | 0 refills | Status: AC

## 2017-03-12 NOTE — Patient Instructions (Addendum)
Likely Trigger Finger Trigger finger (stenosing tenosynovitis) is a condition that causes a finger to get stuck in a bent position. Each finger has a tough, cord-like tissue that connects muscle to bone (tendon), and each tendon is surrounded by a tunnel of tissue (tendon sheath). To move your finger, your tendon needs to slide freely through the sheath. Trigger finger happens when the tendon or the sheath thickens, making it difficult to move your finger. Trigger finger can affect any finger or a thumb. It may affect more than one finger. Mild cases may clear up with rest and medicine. Severe cases require more treatment. What are the causes? Trigger finger is caused by a thickened finger tendon or tendon sheath. The cause of this thickening is not known. What increases the risk? The following factors may make you more likely to develop this condition:  Doing activities that require a strong grip.  Having rheumatoid arthritis, gout, or diabetes.  Being 25-34 years old.  Being a woman.  What are the signs or symptoms? Symptoms of this condition include:  Pain when bending or straightening your finger.  Tenderness or swelling where your finger attaches to the palm of your hand.  A lump in the palm of your hand or on the inside of your finger.  Hearing a popping sound when you try to straighten your finger.  Feeling a popping, catching, or locking sensation when you try to straighten your finger.  Being unable to straighten your finger.  How is this diagnosed? This condition is diagnosed based on your symptoms and a physical exam. How is this treated? This condition may be treated by:  Resting your finger and avoiding activities that make symptoms worse.  Wearing a finger splint to keep your finger in a slightly bent position.  Taking NSAIDs to relieve pain and swelling.  Injecting medicine (steroids) into the tendon sheath to reduce swelling and irritation. Injections may  need to be repeated.  Having surgery to open the tendon sheath. This may be done if other treatments do not work and you cannot straighten your finger. You may need physical therapy after surgery.  Follow these instructions at home:  Use moist heat to help reduce pain and swelling as told by your health care provider.  Rest your finger and avoid activities that make pain worse. Return to normal activities as told by your health care provider.  If you have a splint, wear it as told by your health care provider.  Take over-the-counter and prescription medicines only as told by your health care provider.  Keep all follow-up visits as told by your health care provider. This is important. Contact a health care provider if:  Your symptoms are not improving with home care. Summary  Trigger finger (stenosing tenosynovitis) causes your finger to get stuck in a bent position, and it can make it difficult and painful to straighten your finger.  This condition develops when a finger tendon or tendon sheath thickens.  Treatment starts with resting, wearing a splint, and taking NSAIDs.  In severe cases, surgery to open the tendon sheath may be needed. This information is not intended to replace advice given to you by your health care provider. Make sure you discuss any questions you have with your health care provider. Document Released: 01/19/2004 Document Revised: 03/11/2016 Document Reviewed: 03/11/2016 Elsevier Interactive Patient Education  2017 [employer].

## 2017-03-12 NOTE — Progress Notes (Signed)
Subjective:     Patient ID: Linda Tran, female   DOB: 1960-07-01, 56 y.o.   MRN: [id number]  HPI  Patient is a pleasant 56 year old Caucasian female in no acute distress she reports she had a previous workers compensation with her left hand that she injured while sliding drawers under bed she reports her left hand with  Her left  fingers that are hurting the past month  were trapped  At time of injury under sliding drawers under a college dorm bed until help came to release them. She was unable to pick up the weight on her left hand at this time. 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 She believes this was 2017 July.( provider verified this workers compensation claim was July 2017 and was for left hand)  She reports her left hand has started bothering her  over the last month and reports her middle finger, ring finger and small pinky  fingers seem to all be getting stuck in a bent position intermittently. She reports this is worse upon awakening.  and he has to pull them back into place.  She has a " sensation "of pain occasionally in her left three digits documented as above. She does not rate this pain just reports it " hurts " occasionally. She denies any recent injuries and no other injuries besides as documented above 2017. She denies any paresthesias or any radiating pain.She is still working at this time.  She is right handed.   She reports she had surgery for trigger finger on right thumb 20 years ago. She went to the hand center for this she reports.   Patient  denies any fever, chills, rash, chest pain, shortness of breath, nausea, vomiting, or diarrhea.  She has not seen PCP yet and is advised to schedule her yearly physical with [place] as soon as possible.  She reports she has been prescribed Chantix but not ready to begin yet. She is still smoking and has been educated.    Current Meds  Medication Sig  . fluticasone (FLONASE)  50 MCG/ACT nasal spray Place into both nostrils daily.  . Iron-Vitamin C (VITRON-C) 65-125 MG TABS Take 65-125 mg by mouth daily. Recheck labs in 3 months , call for lab appointment   Patient Active Problem List   Diagnosis Date Noted  . Anemia 10/31/2016  . Suspected exposure to mold 10/31/2016  . Takes iron supplements 10/23/2016   Review of Systems  Constitutional: Negative.   Respiratory: Negative.   Cardiovascular: Negative.   Musculoskeletal: Positive for joint swelling (mild digits left hand middle, ring and pinky.).  Skin: Negative.   Neurological: Negative.   Hematological: Negative.        Objective:   Physical Exam  Constitutional: She is oriented to person, place, and time. Vital signs are normal. She appears well-developed and well-nourished. No distress.  HENT:  Head: Normocephalic and atraumatic.  Cardiovascular: Normal rate, regular rhythm, normal heart sounds and intact distal pulses. Exam reveals no gallop and no friction rub.  No murmur heard. Pulmonary/Chest: Effort normal and breath sounds normal. No respiratory distress. She has no wheezes. She has no rales. She exhibits no tenderness.  Abdominal: Soft.  Musculoskeletal:       Arms:      Left hand: She exhibits swelling. She exhibits normal range of motion (slow range of motion at times left fingers on diagram marked, ), no tenderness, no bony tenderness,  normal two-point discrimination, normal capillary refill, no deformity and no laceration. Normal sensation noted. Decreased sensation is not present in the ulnar distribution, is not present in the medial redistribution and is not present in the radial distribution. Normal strength noted. She exhibits no finger abduction.       Hands: Three fingers of concern  on left hand  Marked on diagram. Areas of stiffness also marked on diagram. Possibly thickened flexor tendon palpated middle finger.No crepitus or pain at time of exam with palpation. She does report mild  pain with starightening her left three digits as marked and occasionally having to manual " unlock ' these three fingers putting them back in place.     Neurological: She is alert and oriented to person, place, and time. She has normal strength.  Skin: Skin is warm, dry and intact. No rash noted. She is not diaphoretic. No erythema. No pallor.  Vitals reviewed.      Assessment:     Left hand pain - Plan: DG Hand Complete Left, Ambulatory referral to Orthopedic Surgery  Trigger finger of left hand, unspecified finger - Plan: DG Hand Complete Left, Ambulatory referral to Orthopedic Surgery  Stiffness of finger joint of left hand - Plan: Ambulatory referral to Orthopedic Surgery      Plan:     Will do Low dose prednisone x 7 days. She is advised no NSAID - such as ibuprofen, aleve, Advil or Motrin during this time while taking prednisone. She may start after per package instructions and only for 2 weeks.  Meds ordered this encounter  Medications  . predniSONE (DELTASONE) 10 MG tablet    Sig: Take 1 tablet (10 mg total) by mouth daily with breakfast for 7 days.    Dispense:  7 tablet    Refill:  0      Orders Placed This Encounter  Procedures  . DG Hand Complete Left    Standing Status:   Future    Standing Expiration Date:   05/12/2018    Order Specific Question:   Reason for Exam (SYMPTOM  OR DIAGNOSIS REQUIRED)    Answer:   left hand / three fingers middle ring and small finger  getting stuck the morning x 1 month- previous hand injury one year ago.    Order Specific Question:   Is patient pregnant?    Answer:   No    Order Specific Question:   Preferred imaging location?    Answer:   [facility]    Order Specific Question:   Call Results- Best Contact Number?    Answer:   [phone]    Order Specific Question:   Radiology Contrast Protocol - do NOT remove file path    Answer:   [email or web address]  . Ambulatory referral to  Orthopedic Surgery    Referral Priority:   Routine    Referral Type:   Surgical    Referral Reason:   Second Opinion    Referred to Provider:   Roseanne Kaufman, MD    Requested Specialty:   Orthopedic Surgery    Number of Visits Requested:   1   Left hand x-ray today will call with results.  She will be referred to hand specialist for evaluation and will let specialist determine if this could be related to worker compensation claim and injury to left hand in 2017.  Patient is aware that this may or may not be related to previous injury and still would like toproceed with referral to hand  surgeon for further evaluation  and treatment and recommendations.   She is to follow up with this office after seeing orthopedics with an appointment.  . Return to clinic at any time  if any new symptoms change, worsen or do not improve. Symptoms should improve  within 72 hours and if not improving you should call for an appointment at the clinic or if worsening symptoms be seen in urgent care/emergency room  if clinic is closed. If any emergent symptoms call 911 at anytime. Patient verbalized above understanding of all instructions and denies any further questions at this time.

## 2017-03-13 ENCOUNTER — Ambulatory Visit
Admission: RE | Admit: 2017-03-13 | Discharge: 2017-03-13 | Disposition: A | Discharge status: Home / Self Care | Payer: BLUE CROSS/BLUE SHIELD | Source: Ambulatory Visit | Attending: Adult Health | Admitting: Adult Health

## 2017-03-13 ENCOUNTER — Ambulatory Visit
Admission: RE | Admit: 2017-03-13 | Discharge: 2017-03-13 | Disposition: A | Discharge status: Home / Self Care | Payer: Worker's Compensation | Source: Ambulatory Visit | Attending: Adult Health | Admitting: Adult Health

## 2017-03-13 DIAGNOSIS — M19042 Primary osteoarthritis, left hand: Secondary | ICD-10-CM | POA: Diagnosis not present

## 2017-03-13 DIAGNOSIS — M79642 Pain in left hand: Secondary | ICD-10-CM

## 2017-03-13 DIAGNOSIS — M6530 Trigger finger, unspecified finger: Secondary | ICD-10-CM

## 2017-03-18 ENCOUNTER — Ambulatory Visit: Payer: Self-pay | Admitting: Medical

## 2017-03-19 ENCOUNTER — Telehealth: Payer: Self-pay | Admitting: Adult Health

## 2017-03-19 NOTE — Telephone Encounter (Signed)
Discusser hand x-ray left hand  results with patient showed as below.  She is requesting phone number to [facility] to call regarding her referral.  She is aware that office manager Mallory Shirk discussed situation with [employer] workers Arboriculturist and after she sees [facility] if found to be related to old injury workers Arboriculturist will evaluate claim.   Patient verbalized understanding of all instructions given and denies any further questions at this time.   CLINICAL DATA:  Left hand pain.  Multiple trigger fingers.  EXAM: LEFT HAND - COMPLETE 3+ VIEW  COMPARISON:  11/08/2015  FINDINGS: There is no evidence of fracture or dislocation. Minimal arthritic changes of the DIP joints of the fingers. Soft tissues are unremarkable.  IMPRESSION: No acute abnormality. Minimal arthritic changes of the DIP joints of the fingers.   Electronically Signed   By: Lorriane Shire M.D.   On: 03/13/2017 17:54CLINICAL DATA:  Left hand pain.  Multiple trigger fingers.  EXAM: LEFT HAND - COMPLETE 3+ VIEW  COMPARISON:  11/08/2015  FINDINGS: There is no evidence of fracture or dislocation. Minimal arthritic changes of the DIP joints of the fingers. Soft tissues are unremarkable.  IMPRESSION: No acute abnormality. Minimal arthritic changes of the DIP joints of the fingers.   Electronically Signed   By: Lorriane Shire M.D.   On: 03/13/2017 17:54

## 2017-03-26 ENCOUNTER — Telehealth: Payer: Self-pay

## 2017-04-16 ENCOUNTER — Telehealth: Payer: Self-pay

## 2017-04-16 NOTE — Telephone Encounter (Signed)
Pt called to inquire about referral to [facility] ortho; has not heard anything from them about appt; original referral made on 03/12/17;  called [facility] and spoke with Margarita Grizzle; states no referral received; made appt with Dr. Amedeo Plenty for 05/06/17 at 12:45; called pt to inform of appt time and she was appreciative of follow up and verb u/o appt date and time; cancelled appt for tomorrow here at clinic-she did not remember what appt was for-possibly f/o for starting Chantix but has not started Chantix yet so wants to cancel appt; will f/u as needed

## 2017-04-17 ENCOUNTER — Ambulatory Visit: Payer: Self-pay | Admitting: Adult Health

## 2017-05-06 DIAGNOSIS — M65332 Trigger finger, left middle finger: Secondary | ICD-10-CM | POA: Insufficient documentation

## 2017-05-06 DIAGNOSIS — M65342 Trigger finger, left ring finger: Secondary | ICD-10-CM | POA: Diagnosis not present

## 2017-06-03 DIAGNOSIS — M65342 Trigger finger, left ring finger: Secondary | ICD-10-CM | POA: Diagnosis not present

## 2017-06-03 DIAGNOSIS — M79642 Pain in left hand: Secondary | ICD-10-CM | POA: Diagnosis not present

## 2017-06-03 DIAGNOSIS — M79644 Pain in right finger(s): Secondary | ICD-10-CM | POA: Diagnosis not present

## 2017-06-09 ENCOUNTER — Other Ambulatory Visit: Payer: Self-pay | Admitting: Obstetrics & Gynecology

## 2017-06-09 DIAGNOSIS — Z853 Personal history of malignant neoplasm of breast: Secondary | ICD-10-CM

## 2017-06-15 ENCOUNTER — Ambulatory Visit: Payer: Self-pay | Admitting: Medical

## 2017-06-15 ENCOUNTER — Encounter: Payer: Self-pay | Admitting: Medical

## 2017-06-15 VITALS — BP 108/62 | HR 88 | Temp 97.9°F | Resp 16 | Wt 130.0 lb

## 2017-06-15 DIAGNOSIS — W540XXA Bitten by dog, initial encounter: Secondary | ICD-10-CM

## 2017-06-15 DIAGNOSIS — L089 Local infection of the skin and subcutaneous tissue, unspecified: Secondary | ICD-10-CM

## 2017-06-15 MED ORDER — AMOXICILLIN-POT CLAVULANATE 875-125 MG PO TABS: 1.0000 | ORAL_TABLET | Freq: Two times a day (BID) | ORAL | 0 refills | Status: DC

## 2017-06-15 NOTE — Progress Notes (Signed)
   Subjective:    Patient ID: Linda Tran, female    DOB: [birth year], 57 y.o.   MRN: [id number]  HPI 57 yo female in non acute distress. Dog recently got  His nai clipped and she was gettjng her dog out of his kennel when he snapped at patient. She thinks that her dog was hurting on one of his nails , "he has never done this before". His Rabies vaccine is up to date. And the patient tetanus is also up to date. She works in Programme researcher, broadcasting/film/video. Patient dog jumped up and teeth mark caught right cheek laterally.  And  Right fore arm tiny puncture and left forearm with bruising and tiny punctures.  She states the mark on her right cheek is tender. Denies fever or chills.  Review of Systems  Constitutional: Negative for chills and fever.  HENT: Negative for congestion, ear pain and sore throat.   Eyes: Negative for discharge and itching.  Respiratory: Negative for cough and shortness of breath.   Cardiovascular: Negative for chest pain.  Gastrointestinal: Negative for abdominal pain.  Genitourinary: Negative for dysuria.  Musculoskeletal: Negative for myalgias.  Skin: Positive for wound (face and  right and left forearms). Negative for rash.  Allergic/Immunologic: Positive for environmental allergies. Negative for food allergies and immunocompromised state.  Neurological: Negative for dizziness, syncope, light-headedness and headaches.  Hematological: Negative for adenopathy.  Psychiatric/Behavioral: Negative for behavioral problems, self-injury and suicidal ideas. The patient is not nervous/anxious.        Objective:   Physical Exam  Constitutional: She is oriented to person, place, and time. She appears well-developed and well-nourished.  HENT:  Head: Normocephalic and atraumatic.  Eyes: Conjunctivae and EOM are normal. Pupils are equal, round, and reactive to light.  Musculoskeletal: Normal range of motion.  Neurological: She is alert and oriented to person, place, and time.  Skin: Skin is  warm and dry. There is erythema.  Psychiatric: She has a normal mood and affect. Her behavior is normal. Judgment and thought content normal.  Nursing note and vitals reviewed.    Mid left forearm posterior with brusing on tiny pinpoint mark, no erythema, mild edema,  right forearm posterior poximal pin point marks x 2 no erythema or swelling. Right side of face on mid cheek with redness and mild swelling. Noted pinpoint mark on the right side of face near eye, no swelling or erythma.     Assessment & Plan:  Dog bites Face right side of cheek laterally, left and right forearms all superficial no closure needed.Bruising left arm. Skin infection face right cheek.. Meds ordered this encounter  Medications  . amoxicillin-clavulanate (AUGMENTIN) 875-125 MG tablet    Sig: Take 1 tablet by mouth 2 (two) times daily.    Dispense:  20 tablet    Refill:  0  Take antibiotics with food. Reviewed wound care with patient .  Recheck in 2 days. Wednesday Morning.If worsening and the clinic is closed seek out medical care at an urgent care or the Emergency Department. Patient verbalizes understanding and has no questions at discharge.

## 2017-06-15 NOTE — Patient Instructions (Signed)
Return in  2 days for a recheck.  Animal Bite Animal bite wounds can get infected. It is important to get proper medical treatment. Ask your doctor if you need rabies treatment. Follow these instructions at home: Wound care  Follow instructions from your doctor about how to take care of your wound. Make sure you: ? Wash your hands with soap and water before you change your bandage (dressing). If you cannot use soap and water, use hand sanitizer. ? Change your bandage as told by your doctor. ? Leave stitches (sutures), skin glue, or skin tape (adhesive) strips in place. They may need to stay in place for 2 weeks or longer. If tape strips get loose and curl up, you may trim the loose edges. Do not remove tape strips completely unless your doctor says it is okay.  Check your wound every day for signs of infection. Watch for: ? Redness, swelling, or pain that gets worse. ? Fluid, blood, or pus. General instructions  Take or apply over-the-counter and prescription medicines only as told by your doctor.  If you were prescribed an antibiotic, take or apply it as told by your doctor. Do not stop using the antibiotic even if your condition improves.  Keep the injured area raised (elevated) above the level of your heart while you are sitting or lying down.  If directed, apply ice to the injured area. ? Put ice in a plastic bag. ? Place a towel between your skin and the bag. ? Leave the ice on for 20 minutes, 2-3 times per day.  Keep all follow-up visits as told by your doctor. This is important. Contact a doctor if:  You have redness, swelling, or pain that gets worse.  You have a general feeling of sickness (malaise).  You feel sick to your stomach (nauseous).  You throw up (vomit).  You have pain that does not get better. Get help right away if:  You have a red streak going away from your wound.  You have fluid, blood, or pus coming from your wound.  You have a fever or  chills.  You have trouble moving your injured area.  You have numbness or tingling anywhere on your body. This information is not intended to replace advice given to you by your health care provider. Make sure you discuss any questions you have with your health care provider. Document Released: 03/31/2005 Document Revised: 09/06/2015 Document Reviewed: 08/16/2014 Elsevier Interactive Patient Education  2018 [employer]. Cellulitis, Adult Cellulitis is a skin infection. The infected area is usually red and sore. This condition occurs most often in the arms and lower legs. It is very important to get treated for this condition. Follow these instructions at home:  Take over-the-counter and prescription medicines only as told by your doctor.  If you were prescribed an antibiotic medicine, take it as told by your doctor. Do not stop taking the antibiotic even if you start to feel better.  Drink enough fluid to keep your pee (urine) clear or pale yellow.  Do not touch or rub the infected area.  Raise (elevate) the infected area above the level of your heart while you are sitting or lying down.  Place warm or cold wet cloths (warm or cold compresses) on the infected area. Do this as told by your doctor.  Keep all follow-up visits as told by your doctor. This is important. These visits let your doctor make sure your infection is not getting worse. Contact a doctor if:  You have a fever.  Your symptoms do not get better after 1-2 days of treatment.  Your bone or joint under the infected area starts to hurt after the skin has healed.  Your infection comes back. This can happen in the same area or another area.  You have a swollen bump in the infected area.  You have new symptoms.  You feel ill and also have muscle aches and pains. Get help right away if:  Your symptoms get worse.  You feel very sleepy.  You throw up (vomit) or have watery poop (diarrhea) for a long  time.  There are red streaks coming from the infected area.  Your red area gets larger.  Your red area turns darker. This information is not intended to replace advice given to you by your health care provider. Make sure you discuss any questions you have with your health care provider. Document Released: 09/17/2007 Document Revised: 09/06/2015 Document Reviewed: 02/07/2015 Elsevier Interactive Patient Education  2018 [employer].

## 2017-06-17 ENCOUNTER — Encounter: Payer: Self-pay | Admitting: Medical

## 2017-06-17 ENCOUNTER — Ambulatory Visit: Payer: Self-pay | Admitting: Medical

## 2017-06-17 VITALS — BP 108/64 | HR 85 | Temp 97.9°F | Resp 18 | Ht 62.0 in | Wt 131.0 lb

## 2017-06-17 DIAGNOSIS — Z760 Encounter for issue of repeat prescription: Secondary | ICD-10-CM

## 2017-06-17 DIAGNOSIS — W540XXD Bitten by dog, subsequent encounter: Secondary | ICD-10-CM

## 2017-06-17 DIAGNOSIS — J301 Allergic rhinitis due to pollen: Secondary | ICD-10-CM

## 2017-06-17 MED ORDER — FLUTICASONE PROPIONATE 50 MCG/ACT NA SUSP: 2.0000 | Freq: Every day | NASAL | 6 refills | Status: DC

## 2017-06-17 NOTE — Patient Instructions (Signed)
Continue and finish antibiotics  Wound Infection A wound infection happens when germs start to grow in the wound. Germs that cause wound infections are most often bacteria. Other types of infections can occur as well. In some cases, infection can cause the wound to break open. Wound infections need treatment. If a wound infection is not treated, complications can happen. Follow these instructions at home: Medicines  Take or apply over-the-counter and prescription medicines only as told by your doctor.  If you were prescribed antibiotic medicine, take or apply it as told by your doctor. Do not stop using the antibiotic even if your condition improves. Wound care  Clean the wound each day or as told by your doctor. ? Wash the wound with mild soap and water. ? Rinse the wound with water to remove all soap. ? Pat the wound dry with a clean towel. Do not rub it.  Follow instructions from your doctor about how to take care of your wound. Make sure you: ? Wash your hands with soap and water before you change your bandage (dressing). If you cannot use soap and water, use hand sanitizer. ? Change your bandage as told by your doctor. ? Leave stitches (sutures), skin glue, or skin tape (adhesive) strips in place if your wound has been closed. They may need to stay in place for 2 weeks or longer. If tape strips get loose and curl up, you may trim the loose edges. Do not remove tape strips completely unless your doctor says it is okay. Some wounds are left open to heal on their own.  Check your wound every day for signs of infection. Watch for: ? More redness, swelling, or pain. ? More fluid or blood. ? Warmth. ? Pus or a bad smell. General instructions  Keep the bandage dry until your doctor says it can be removed.  Do not take baths, swim, use a hot tub, or do anything that would put your wound underwater until your doctor says it is okay.  Raise (elevate) the injured area above the level of  your heart while you are sitting or lying down.  Do not scratch or pick at the wound.  Keep all follow-up visits as told by your doctor. This is important. Contact a doctor if:  Medicine does not help your pain.  You have more redness, swelling, or pain in the area of your wound.  You have more fluid or blood coming from your wound.  Your wound feels warm to the touch.  You have pus coming from your wound.  You continue to notice a bad smell coming from your wound or your bandage.  Your wound that was closed breaks open. Get help right away if:  You have a red streak going away from your wound.  You have a fever. This information is not intended to replace advice given to you by your health care provider. Make sure you discuss any questions you have with your health care provider. Document Released: 01/08/2008 Document Revised: 09/06/2015 Document Reviewed: 09/18/2014 Elsevier Interactive Patient Education  2018 [employer].

## 2017-06-17 NOTE — Progress Notes (Signed)
   Subjective:    Patient ID: Linda Tran, female    DOB: 06/20/1960, 57 y.o.   MRN: [id number]  HPI  Here for wound recheck of a dog bit to right side of cheek. Much improved in pain and redness. No fever or chills. Would like refill of Flonae for allergic rhinitis.  Review of Systems  Constitutional: Negative for chills and fever.  HENT: Positive for rhinorrhea. Negative for congestion.   Respiratory: Negative for cough and shortness of breath.   Cardiovascular: Negative for chest pain.  Gastrointestinal: Negative for abdominal pain.  Genitourinary: Negative for dysuria.  Musculoskeletal: Negative for myalgias.  Skin: Negative for rash.  Allergic/Immunologic: Positive for environmental allergies. Negative for food allergies and immunocompromised state.  Neurological: Negative for dizziness, syncope and light-headedness.  Hematological: Negative for adenopathy.  Psychiatric/Behavioral: Negative for self-injury and suicidal ideas. The patient is not nervous/anxious.    Working on [employer] [place] [employer]    Objective:   Physical Exam  Constitutional: She is oriented to person, place, and time. She appears well-developed and well-nourished.  HENT:  Head: Normocephalic.  Eyes: Conjunctivae and EOM are normal. Pupils are equal, round, and reactive to light.  Neurological: She is alert and oriented to person, place, and time.  Skin: Skin is warm and dry. There is erythema (mild, much improved.  right and left forearms look ideal).  Psychiatric: She has a normal mood and affect. Her behavior is normal. Judgment and thought content normal.  Nursing note and vitals reviewed.         Assessment & Plan:  Dog bit ( her dog with utd rabies vaccine). Wound care reviewed to finish antibiotics. Refill, Allergic Rhinitis Meds ordered this encounter  Medications  . fluticasone (FLONASE) 50 MCG/ACT nasal spray    Sig: Place 2 sprays into both nostrils  daily.    Dispense:  16 g    Refill:  6  return to clinic as needed.

## 2017-06-19 ENCOUNTER — Other Ambulatory Visit: Payer: Self-pay | Admitting: Obstetrics & Gynecology

## 2017-06-19 DIAGNOSIS — Z853 Personal history of malignant neoplasm of breast: Secondary | ICD-10-CM

## 2017-06-29 ENCOUNTER — Ambulatory Visit
Admission: RE | Admit: 2017-06-29 | Discharge: 2017-06-29 | Disposition: A | Discharge status: Home / Self Care | Payer: BLUE CROSS/BLUE SHIELD | Source: Ambulatory Visit | Attending: Obstetrics & Gynecology | Admitting: Obstetrics & Gynecology

## 2017-06-29 DIAGNOSIS — R928 Other abnormal and inconclusive findings on diagnostic imaging of breast: Secondary | ICD-10-CM | POA: Diagnosis not present

## 2017-06-29 DIAGNOSIS — Z853 Personal history of malignant neoplasm of breast: Secondary | ICD-10-CM

## 2017-06-29 DIAGNOSIS — Z1231 Encounter for screening mammogram for malignant neoplasm of breast: Secondary | ICD-10-CM | POA: Insufficient documentation

## 2017-06-29 HISTORY — DX: Personal history of irradiation: Z92.3

## 2017-07-01 DIAGNOSIS — Z6823 Body mass index (BMI) 23.0-23.9, adult: Secondary | ICD-10-CM | POA: Diagnosis not present

## 2017-07-01 DIAGNOSIS — Z01419 Encounter for gynecological examination (general) (routine) without abnormal findings: Secondary | ICD-10-CM | POA: Diagnosis not present

## 2017-07-02 NOTE — Telephone Encounter (Signed)
Let a message with the patient to contact the clinic asap to set up an appt for lab work.

## 2017-07-03 ENCOUNTER — Telehealth: Payer: Self-pay

## 2017-07-03 NOTE — Telephone Encounter (Signed)
Left message to contact our office on Monday morning to schedule a lab appointment for a CBC and Iron Panel unless she had been seen by her PCP.

## 2017-07-07 ENCOUNTER — Other Ambulatory Visit: Payer: Self-pay

## 2017-07-07 DIAGNOSIS — D649 Anemia, unspecified: Secondary | ICD-10-CM

## 2017-07-07 DIAGNOSIS — D509 Iron deficiency anemia, unspecified: Secondary | ICD-10-CM

## 2017-07-07 DIAGNOSIS — D508 Other iron deficiency anemias: Secondary | ICD-10-CM

## 2017-07-08 LAB — CBC WITH DIFFERENTIAL/PLATELET
BASOS: 1 %
Basophils Absolute: 0 10*3/uL (ref 0.0–0.2)
EOS (ABSOLUTE): 0.3 10*3/uL (ref 0.0–0.4)
EOS: 6 %
HEMATOCRIT: 39.2 % (ref 34.0–46.6)
Hemoglobin: 12.2 g/dL (ref 11.1–15.9)
IMMATURE GRANS (ABS): 0 10*3/uL (ref 0.0–0.1)
IMMATURE GRANULOCYTES: 0 %
LYMPHS: 34 %
Lymphocytes Absolute: 1.8 10*3/uL (ref 0.7–3.1)
MCH: 26.2 pg — ABNORMAL LOW (ref 26.6–33.0)
MCHC: 31.1 g/dL — ABNORMAL LOW (ref 31.5–35.7)
MCV: 84 fL (ref 79–97)
MONOS ABS: 0.3 10*3/uL (ref 0.1–0.9)
Monocytes: 6 %
NEUTROS PCT: 53 %
Neutrophils Absolute: 2.9 10*3/uL (ref 1.4–7.0)
Platelets: 295 10*3/uL (ref 150–379)
RBC: 4.65 x10E6/uL (ref 3.77–5.28)
RDW: 17.4 % — ABNORMAL HIGH (ref 12.3–15.4)
WBC: 5.3 10*3/uL (ref 3.4–10.8)

## 2017-07-08 LAB — IRON,TIBC AND FERRITIN PANEL
Ferritin: 11 ng/mL — ABNORMAL LOW (ref 15–150)
IRON: 51 ug/dL (ref 27–159)
Iron Saturation: 14 % — ABNORMAL LOW (ref 15–55)
TIBC: 352 ug/dL (ref 250–450)
UIBC: 301 ug/dL (ref 131–425)

## 2017-07-09 ENCOUNTER — Telehealth: Payer: Self-pay | Admitting: Adult Health

## 2017-07-09 ENCOUNTER — Other Ambulatory Visit: Payer: Self-pay | Admitting: Adult Health

## 2017-07-09 DIAGNOSIS — D649 Anemia, unspecified: Secondary | ICD-10-CM

## 2017-07-09 MED ORDER — IRON-VITAMIN C 65-125 MG PO TABS: 65.0000 mg | ORAL_TABLET | Freq: Every day | ORAL | 0 refills | Status: DC

## 2017-07-09 NOTE — Progress Notes (Signed)
Spoke with patient and addressed all questions listed by the provider below. Pt has not been taking her Iron tablets as directed. She has been taking one tablet three times a week.  She is requesting another prescription for the Iron Tablets.  Michelle Flinchum PA notified and refill called into her pharmacy. We reviewed the importance of a primary care doctor and she needs to follow up asap. She states that she will call us back at a later time to schedule a three month appointment for labs and an office visit if she doesn't have a PCP by that time.

## 2017-07-09 NOTE — Telephone Encounter (Signed)
See paper chart for documentation workers comp related. Paper chart per manager Theodis Sato RN.

## 2017-07-09 NOTE — Progress Notes (Signed)
Meds ordered this encounter  Medications  . Iron-Vitamin C (VITRON-C) 65-125 MG TABS    Sig: Take 65-125 mg by mouth daily. Recheck labs in 3 months , call for lab appointment    Dispense:  90 tablet    Refill:  0    Repeat labs three months - 10/09/16 or after. Recommend establishing primary care   Sent prescription to [facility] today after nurse Nelda Severe speaking with patient -0 she has not been taking consistently " maybe 3 x a week because she was feeling better ". She denies any vaginal or rectal bleeding, or any other blood loss per nursing Nelda Severe after phone call follow up on 07/09/17.  She will need recheck in 3 months. Nursing will inform her provider still recommends primary care MD for annual physicals and follow up care as recommended as this clinic is an acute care clinic.   Provider also recommends patient see primary care physician for a routine physical and to establish primary care. Patient may chose provider of choice. Also gave the [place] at [phone] or web site at [email or web address] to help assist with finding a primary care doctor. Patient understands this office is acute care and no longer taking new primary care patients.   Advised patient call the office or your primary care doctor for an appointment if no improvement within 72 hours or if any symptoms change or worsen at any time  Advised ER or urgent Care if after hours or on weekend. Call 911 for emergency symptoms at any time.Patinet verbalized understanding of all instructions given/reviewed and treatment plan and has no further questions or concerns at this time.     I have already ordered this as a future order in the computer.  Orders Placed This Encounter  Procedures  . Iron, TIBC and Ferritin Panel    Standing Status:   Future    Standing Expiration Date:   11/08/2017  . CBC with Differential    Standing Status:   Future    Standing Expiration Date:   11/08/2017      Lab Orders     Iron, TIBC and Ferritin Panel     CBC with Differential

## 2017-07-18 DIAGNOSIS — M65342 Trigger finger, left ring finger: Secondary | ICD-10-CM | POA: Diagnosis not present

## 2017-07-18 DIAGNOSIS — M79645 Pain in left finger(s): Secondary | ICD-10-CM | POA: Diagnosis not present

## 2017-08-20 ENCOUNTER — Ambulatory Visit: Payer: Self-pay | Admitting: Adult Health

## 2017-08-20 ENCOUNTER — Ambulatory Visit
Admission: RE | Admit: 2017-08-20 | Discharge: 2017-08-20 | Disposition: A | Discharge status: Home / Self Care | Payer: BLUE CROSS/BLUE SHIELD | Source: Ambulatory Visit | Attending: Adult Health | Admitting: Adult Health

## 2017-08-20 ENCOUNTER — Encounter: Payer: Self-pay | Admitting: Adult Health

## 2017-08-20 VITALS — BP 110/70 | HR 97 | Temp 98.6°F | Resp 16 | Ht 62.0 in | Wt 134.0 lb

## 2017-08-20 DIAGNOSIS — Z853 Personal history of malignant neoplasm of breast: Secondary | ICD-10-CM | POA: Diagnosis not present

## 2017-08-20 DIAGNOSIS — M542 Cervicalgia: Secondary | ICD-10-CM | POA: Diagnosis not present

## 2017-08-20 DIAGNOSIS — Z8739 Personal history of other diseases of the musculoskeletal system and connective tissue: Secondary | ICD-10-CM | POA: Diagnosis not present

## 2017-08-20 DIAGNOSIS — F17210 Nicotine dependence, cigarettes, uncomplicated: Secondary | ICD-10-CM | POA: Insufficient documentation

## 2017-08-20 DIAGNOSIS — M545 Low back pain, unspecified: Secondary | ICD-10-CM

## 2017-08-20 DIAGNOSIS — M419 Scoliosis, unspecified: Secondary | ICD-10-CM | POA: Diagnosis not present

## 2017-08-20 DIAGNOSIS — M546 Pain in thoracic spine: Secondary | ICD-10-CM | POA: Diagnosis not present

## 2017-08-20 DIAGNOSIS — M549 Dorsalgia, unspecified: Secondary | ICD-10-CM

## 2017-08-20 DIAGNOSIS — R079 Chest pain, unspecified: Secondary | ICD-10-CM | POA: Diagnosis not present

## 2017-08-20 DIAGNOSIS — R05 Cough: Secondary | ICD-10-CM | POA: Diagnosis not present

## 2017-08-20 MED ORDER — CYCLOBENZAPRINE HCL 5 MG PO TABS: 5.0000 mg | ORAL_TABLET | Freq: Every day | ORAL | 1 refills | Status: DC

## 2017-08-20 NOTE — Patient Instructions (Signed)

## 2017-08-20 NOTE — Progress Notes (Signed)
Subjective:     Patient ID: Linda Tran, female   DOB: March 24, 1961, 57 y.o.   MRN: [id number]  HPI Blood pressure 110/70, pulse 97, temperature 98.6 F (37 C), resp. rate 16, height 5\' 2"  (1.575 m), weight 134 lb (60.8 kg), last menstrual period 11/01/2015, SpO2 98 %.  Patient is a 57 year old female in no acute distress who comes in for cough and lower back pain right side.Denies any pain in legs. She does report mild numbness in right thigh that resolved after walking.   Demoes any injury. She is seeing a chiropractor in [place] three times and saw her last month for back adjustments.   She denies any x- rays done prior to chiropractor.  Patient  denies any fever, body aches,chills, rash, chest pain, shortness of breath, nausea, vomiting, or diarrhea.   Smoker - 1 PPD 30 + years.  She reports she has not seen a PCP yet but does plan to soon she reports.    Review of Systems  Constitutional: Negative.   HENT: Positive for postnasal drip and rhinorrhea. Negative for congestion, dental problem, drooling, ear discharge, ear pain, facial swelling, hearing loss, mouth sores, nosebleeds, sinus pressure, sinus pain, sneezing, sore throat, tinnitus, trouble swallowing and voice change.   Respiratory: Positive for cough (occasionally ). Negative for apnea, choking, chest tightness, shortness of breath, wheezing and stridor.   Cardiovascular: Negative.   Gastrointestinal: Negative.   Genitourinary: Negative.   Musculoskeletal: Positive for back pain. Negative for arthralgias, gait problem, joint swelling, myalgias, neck pain and neck stiffness.  Skin: Negative.   Neurological: Negative.   Hematological: Negative.   Psychiatric/Behavioral: Negative.        Objective:   Physical Exam  Constitutional: She is oriented to person, place, and time. She appears well-developed and well-nourished.  Patient is alert and oriented and responsive to questions Engages in eye contact with provider.  Speaks in full sentences without any pauses without any shortness of breath.    HENT:  Head: Normocephalic and atraumatic.  Eyes: Pupils are equal, round, and reactive to light. Conjunctivae and EOM are normal.  Neck: Normal range of motion. Neck supple.  Cardiovascular: Normal rate, regular rhythm and intact distal pulses. Exam reveals no gallop and no friction rub.  No murmur heard. Pulmonary/Chest: Effort normal and breath sounds normal.  Abdominal: Soft. Bowel sounds are normal. She exhibits no distension and no mass. There is no tenderness. There is no rebound and no guarding. No hernia.  Musculoskeletal: Normal range of motion. She exhibits tenderness. She exhibits no edema or deformity.       Right shoulder: Normal.       Left shoulder: Normal.       Right elbow: Normal.      Left elbow: Normal.       Right hip: Normal.       Left hip: Normal.       Cervical back: Normal.       Thoracic back: She exhibits tenderness. She exhibits normal range of motion, no bony tenderness, no swelling, no edema, no deformity, no laceration, no pain, no spasm and normal pulse.       Lumbar back: She exhibits tenderness, pain and spasm. She exhibits normal range of motion, no bony tenderness, no swelling, no edema, no deformity, no laceration and normal pulse.       Back:       Right upper arm: Normal.       Left upper arm:  Normal.       Right upper leg: Normal.       Left upper leg: Normal.  Area of tenderness marked on diagram. History of cervical stiffness she reports- none recent.   Lymphadenopathy:       Head (right side): No submental, no submandibular, no tonsillar, no preauricular, no posterior auricular and no occipital adenopathy present.       Head (left side): No submental, no submandibular, no tonsillar, no preauricular, no posterior auricular and no occipital adenopathy present.    She has no cervical adenopathy.    She has no axillary adenopathy.  Neurological: She is alert and  oriented to person, place, and time. She has normal strength and normal reflexes. She displays normal reflexes. No cranial nerve deficit or sensory deficit. She displays a negative Romberg sign. GCS eye subscore is 4. GCS verbal subscore is 5. GCS motor subscore is 6.  Patient moves on and off of exam table and in room without difficulty. Gait is normal in hall and in room. Patient is oriented to person place time and situation. Patient answers questions appropriately and engages in conversation.   Skin: Skin is warm and dry. Capillary refill takes less than 2 seconds. No rash noted. No erythema. No pallor.  Psychiatric: She has a normal mood and affect. Her behavior is normal. Judgment and thought content normal.  Vitals reviewed.      Assessment:     Acute right-sided low back pain without sciatica - Plan: DG Chest 2 View, DG Lumbar Spine Complete, DG Cervical Spine Complete, DG Thoracic Spine 2 View, DG Cervical Spine Complete, DG Thoracic Spine 2 View  Pain of mid back - Plan: DG Cervical Spine Complete, DG Thoracic Spine 2 View  History of stiff neck - Plan: DG Cervical Spine Complete, DG Thoracic Spine 2 View      Plan:   if plans to pursue chiropractic care recommend x ray prior.   Meds ordered this encounter  Medications  . cyclobenzaprine (FLEXERIL) 5 MG tablet    Sig: Take 1 tablet (5 mg total) by mouth at bedtime.    Dispense:  20 tablet    Refill:  [place] technician and she will discontinue refill that was sent- provider only filled 20 tablets no additional refills. Lovena Le canceled refill at [facility].   Advised patient call the office or your primary care doctor for an appointment if no improvement within 72 hours or if any symptoms change or worsen at any time  Advised ER or urgent Care if after hours or on weekend. Call 911 for emergency symptoms at any time.Patinet verbalized understanding of all instructions given/reviewed and  treatment plan and has no further questions or concerns at this time.   Patient verbalized understanding of all instructions given and denies any further questions at this time.

## 2017-08-21 ENCOUNTER — Telehealth: Payer: Self-pay | Admitting: Adult Health

## 2017-08-21 NOTE — Progress Notes (Signed)
Reviewed result with patient. Keep current plan return if symptoms persist and or do not resolve with treatment plan.  Provider thoroughly discussed in collaboration above plan with supervising physician Dr. Miguel Aschoff who is in agreement with the care plan as above.

## 2017-08-21 NOTE — Progress Notes (Signed)
Reviewed result with patient. Keep current plan return if symptoms persist and or do not resolve with treatment plan.

## 2017-08-21 NOTE — Telephone Encounter (Signed)
Called 08/21/2017 12:55 pm - patient to review x ray results- mild degenerative changes - mild scoliosis.   Discussed continuing current treatment plan given at last visit. If any symptoms persist she will return to the office for follow up. Flexeril and NSAID as directed.  Patient is in agreement.   Advised patient call the office or your primary care doctor for an appointment if no improvement within 72 hours or if any symptoms change or worsen at any time  Advised ER or urgent Care if after hours or on weekend. Call 911 for emergency symptoms at any time.Patinet verbalized understanding of all instructions given/reviewed and treatment plan and has no further questions or concerns at this time.    Patient verbalized understanding of all instructions given and denies any further questions at this time.

## 2017-08-21 NOTE — Progress Notes (Signed)
Reviewed result with patient. Keep current plan return if symptoms persist and or do not resolve with treatment plan. Provider thoroughly discussed in collaboration above plan with supervising physician Dr. Miguel Aschoff who is in agreement with the care plan as above.

## 2017-09-17 ENCOUNTER — Ambulatory Visit: Payer: Self-pay | Admitting: Adult Health

## 2017-09-17 DIAGNOSIS — Z026 Encounter for examination for insurance purposes: Secondary | ICD-10-CM

## 2017-09-17 NOTE — Progress Notes (Signed)
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   Sent to [facility] for shoulder - right injury.  Provider thoroughly discussed in collaboration above plan with supervising physician Dr. Miguel Aschoff who is in agreement with the care plan as above.

## 2017-10-30 ENCOUNTER — Ambulatory Visit: Payer: Self-pay | Admitting: Adult Health

## 2017-10-30 DIAGNOSIS — Z026 Encounter for examination for insurance purposes: Secondary | ICD-10-CM

## 2017-10-30 NOTE — Progress Notes (Signed)
Office visit  documented on paper chart per protocol initiated by Theodis Sato RN occupational  supervisor and in agreement with collaborating physician Miguel Aschoff MD. Sent to [facility] 10/30/17.  See Licensed convey[employer] and Wellness and or [place] workers Lobbyist for copy.

## 2017-12-08 ENCOUNTER — Other Ambulatory Visit: Payer: Self-pay | Admitting: Orthopedic Surgery

## 2017-12-08 DIAGNOSIS — M25511 Pain in right shoulder: Principal | ICD-10-CM

## 2017-12-08 DIAGNOSIS — G8929 Other chronic pain: Secondary | ICD-10-CM

## 2017-12-14 ENCOUNTER — Emergency Department
Admission: EM | Admit: 2017-12-14 | Discharge: 2017-12-14 | Disposition: A | Discharge status: Home / Self Care | Payer: No Typology Code available for payment source | Attending: Emergency Medicine | Admitting: Emergency Medicine

## 2017-12-14 ENCOUNTER — Other Ambulatory Visit: Payer: Self-pay

## 2017-12-14 ENCOUNTER — Ambulatory Visit: Payer: Self-pay | Admitting: Adult Health

## 2017-12-14 ENCOUNTER — Encounter: Payer: Self-pay | Admitting: Emergency Medicine

## 2017-12-14 DIAGNOSIS — Y93G1 Activity, food preparation and clean up: Secondary | ICD-10-CM | POA: Diagnosis not present

## 2017-12-14 DIAGNOSIS — Z79899 Other long term (current) drug therapy: Secondary | ICD-10-CM | POA: Insufficient documentation

## 2017-12-14 DIAGNOSIS — W25XXXA Contact with sharp glass, initial encounter: Secondary | ICD-10-CM | POA: Diagnosis not present

## 2017-12-14 DIAGNOSIS — Y92000 Kitchen of unspecified non-institutional (private) residence as  the place of occurrence of the external cause: Secondary | ICD-10-CM | POA: Diagnosis not present

## 2017-12-14 DIAGNOSIS — S61210A Laceration without foreign body of right index finger without damage to nail, initial encounter: Secondary | ICD-10-CM | POA: Insufficient documentation

## 2017-12-14 DIAGNOSIS — S6991XA Unspecified injury of right wrist, hand and finger(s), initial encounter: Secondary | ICD-10-CM | POA: Diagnosis present

## 2017-12-14 DIAGNOSIS — Z853 Personal history of malignant neoplasm of breast: Secondary | ICD-10-CM | POA: Insufficient documentation

## 2017-12-14 DIAGNOSIS — Y999 Unspecified external cause status: Secondary | ICD-10-CM | POA: Insufficient documentation

## 2017-12-14 DIAGNOSIS — Y990 Civilian activity done for income or pay: Secondary | ICD-10-CM

## 2017-12-14 DIAGNOSIS — F17210 Nicotine dependence, cigarettes, uncomplicated: Secondary | ICD-10-CM | POA: Diagnosis not present

## 2017-12-14 NOTE — ED Provider Notes (Signed)
[facility] Emergency Department Provider Note   ____________________________________________   First MD Initiated Contact with Patient 12/14/17 1225     (approximate)  I have reviewed the triage vital signs and the nursing notes.   HISTORY  Chief Complaint Laceration    HPI Linda Tran is a 57 y.o. female presents a laceration to the distal index finger of the right hand.  Patient states she was moving broken glass from a dishwasher when she cut her finger.  Bleeding is controlled with direct pressure.  Patient has lost sensation loss of function of the right finger.  Past Medical History:  Diagnosis Date  . Allergy   . Anemia   . Breast cancer ([place]) 2013   left breast, DCIS, radiation  . Personal history of radiation therapy 2013   F/U from lumpectomy for DCIS    Patient Active Problem List   Diagnosis Date Noted  . Pain of left hand 06/03/2017  . Acquired trigger finger of left middle finger 05/06/2017  . Hand injury, left, sequela 03/12/2017  . Anemia 10/31/2016  . Suspected exposure to mold 10/31/2016  . Takes iron supplements 10/23/2016    Past Surgical History:  Procedure Laterality Date  . BREAST BIOPSY Left 01/12/12   positive,DCIS  . BREAST BIOPSY Right 2012   negative  . BREAST BIOPSY Right 2017   2 MM INTRADUCTAL PAPILLOMA.   Marland Kitchen BREAST CYST ASPIRATION Left negative  . BREAST EXCISIONAL BIOPSY Left 01/19/12   positve, DCIS  . BREAST LUMPECTOMY Left 2013   DCIS, lumpectomy F/U radiation   . BREAST SURGERY    . CESAREAN SECTION    . COLONOSCOPY    . POLYPECTOMY     hyperplastic polyps  . TUBAL LIGATION      Prior to Admission medications   Medication Sig Start Date End Date Taking? Authorizing Provider  cyclobenzaprine (FLEXERIL) 5 MG tablet Take 1 tablet (5 mg total) by mouth at bedtime. 08/20/17   Flinchum, Kelby Aline, FNP  fluticasone (FLONASE) 50 MCG/ACT nasal spray Place 2 sprays into both nostrils daily. 06/17/17    Ratcliffe, Heather R, PA-C  Iron-Vitamin C (VITRON-C) 65-125 MG TABS Take 65-125 mg by mouth daily. Recheck labs in 3 months , call for lab appointment 07/09/17   Flinchum, Kelby Aline, FNP    Allergies Latex  Family History  Problem Relation Age of Onset  . Heart disease Father 43       AMI  . Colon cancer Neg Hx   . Colon polyps Neg Hx   . Stomach cancer Neg Hx   . Rectal cancer Neg Hx   . Esophageal cancer Neg Hx     Social History Social History   Tobacco Use  . Smoking status: Current Every Day Smoker    Packs/day: 1.00    Types: Cigarettes  . Smokeless tobacco: Never Used  Substance Use Topics  . Alcohol use: Yes    Comment: social  . Drug use: No    Review of Systems Constitutional: No fever/chills Eyes: No visual changes. ENT: No sore throat. Cardiovascular: Denies chest pain. Respiratory: Denies shortness of breath. Gastrointestinal: No abdominal pain.  No nausea, no vomiting.  No diarrhea.  No constipation. Genitourinary: Negative for dysuria. Musculoskeletal: Negative for back pain. Skin: Negative for rash.  Right index finger laceration Neurological: Negative for headaches, focal weakness or numbness.  Allergic/Immunilogical: Latex  ____________________________________________   PHYSICAL EXAM:  VITAL SIGNS: ED Triage Vitals  Enc Vitals Group  BP 12/14/17 1221 (!) 107/50     Pulse Rate 12/14/17 1221 74     Resp 12/14/17 1221 16     Temp 12/14/17 1221 98.5 F (36.9 C)     Temp Source 12/14/17 1221 Oral     SpO2 12/14/17 1221 95 %     Weight 12/14/17 1219 134 lb (60.8 kg)     Height 12/14/17 1219 5\' 2"  (1.575 m)     Head Circumference --      Peak Flow --      Pain Score 12/14/17 1219 0     Pain Loc --      Pain Edu? --      Excl. in [place]? --    Constitutional: Alert and oriented. Well appearing and in no acute distress. Cardiovascular: Normal rate, regular rhythm. Grossly normal heart sounds.  Good peripheral circulation. Respiratory:  Normal respiratory effort.  No retractions. Lungs CTAB. Skin: 0.5 cm laceration distal right index finger.   Psychiatric: Mood and affect are normal. Speech and behavior are normal.  ____________________________________________   LABS (all labs ordered are listed, but only abnormal results are displayed)  Labs Reviewed - No data to display ____________________________________________  EKG   ____________________________________________  RADIOLOGY  ED MD interpretation:    Official radiology report(s): No results found.  ____________________________________________   PROCEDURES  Procedure(s) performed: None  .Marland KitchenLaceration Repair Date/Time: 12/14/2017 12:38 PM Performed by: Sable Feil, PA-C Authorized by: Sable Feil, PA-C   Consent:    Consent obtained:  Verbal   Consent given by:  Patient   Risks discussed:  Infection and pain Anesthesia (see MAR for exact dosages):    Anesthesia method:  None Laceration details:    Location:  Finger   Finger location:  R index finger   Length (cm):  0.5 Repair type:    Repair type:  Simple Pre-procedure details:    Preparation:  Patient was prepped and draped in usual sterile fashion Exploration:    Contaminated: no   Treatment:    Area cleansed with:  Betadine and saline   Amount of cleaning:  Standard   Visualized foreign bodies/material removed: no   Skin repair:    Repair method:  Tissue adhesive Post-procedure details:    Dressing:  Adhesive bandage   Patient tolerance of procedure:  Tolerated well, no immediate complications    Critical Care performed: No  ____________________________________________   INITIAL IMPRESSION / ASSESSMENT AND PLAN / ED COURSE  As part of my medical decision making, I reviewed the following data within the electronic MEDICAL RECORD NUMBER    Laceration right index finger.  Area was clean and closed with Dermabond.  Patient given discharge care instructions and advised return  to ED if wound reopens before healing is complete.      ____________________________________________   FINAL CLINICAL IMPRESSION(S) / ED DIAGNOSES  Final diagnoses:  Laceration of right index finger without foreign body without damage to nail, initial encounter     ED Discharge Orders    None       Note:  This document was prepared using Dragon voice recognition software and may include unintentional dictation errors.    Sable Feil, PA-C 12/14/17 1240    Earleen Newport, MD 12/14/17 1314

## 2017-12-14 NOTE — Discharge Instructions (Addendum)
Follow discharge care instructions.  Do not apply ointments or cream to the Dermabond area.  If using a Band-Aid did not let the adhesive part test the Dermabond area.

## 2017-12-14 NOTE — ED Triage Notes (Signed)
Small laceration to right 2nd digit from glass at work. 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  Bleeding controlled.

## 2017-12-14 NOTE — Progress Notes (Signed)
Office visit  documented on paper chart per protocol initiated by Theodis Sato RN occupational  supervisor and in agreement with collaborating physician Miguel Aschoff MD.   See [facility] Staff and Wellness and or [place] workers compensation department for copy.

## 2017-12-14 NOTE — ED Notes (Signed)
See triage note  States she was trying to get a glass from dishwasher  The glass broke  Small laceration noted to right index finger

## 2017-12-25 ENCOUNTER — Ambulatory Visit
Admission: RE | Admit: 2017-12-25 | Discharge: 2017-12-25 | Disposition: A | Discharge status: Home / Self Care | Payer: Worker's Compensation | Source: Ambulatory Visit | Attending: Orthopedic Surgery | Admitting: Orthopedic Surgery

## 2017-12-25 DIAGNOSIS — M25511 Pain in right shoulder: Principal | ICD-10-CM

## 2017-12-25 DIAGNOSIS — G8929 Other chronic pain: Secondary | ICD-10-CM

## 2017-12-25 MED ORDER — IOPAMIDOL (ISOVUE-M 200) INJECTION 41%
12.0000 mL | Freq: Once | INTRAMUSCULAR | Status: AC
  Administered 2017-12-25: 12 mL via INTRA_ARTICULAR

## 2018-01-21 ENCOUNTER — Encounter: Payer: Self-pay | Admitting: Adult Health

## 2018-01-21 ENCOUNTER — Ambulatory Visit: Payer: Self-pay | Admitting: Adult Health

## 2018-01-21 VITALS — BP 115/80 | HR 98 | Temp 98.1°F | Resp 16 | Ht 62.0 in | Wt 132.0 lb

## 2018-01-21 DIAGNOSIS — H60502 Unspecified acute noninfective otitis externa, left ear: Secondary | ICD-10-CM

## 2018-01-21 DIAGNOSIS — J0100 Acute maxillary sinusitis, unspecified: Secondary | ICD-10-CM

## 2018-01-21 DIAGNOSIS — H6502 Acute serous otitis media, left ear: Secondary | ICD-10-CM

## 2018-01-21 MED ORDER — AMOXICILLIN-POT CLAVULANATE 875-125 MG PO TABS: 1.0000 | ORAL_TABLET | Freq: Two times a day (BID) | ORAL | 0 refills | Status: DC

## 2018-01-21 MED ORDER — NEOMYCIN-POLYMYXIN-HC 3.5-10000-1 OT SOLN: 3.0000 [drp] | Freq: Four times a day (QID) | OTIC | 0 refills | Status: DC

## 2018-01-21 MED ORDER — PREDNISONE 10 MG (21) PO TBPK: ORAL_TABLET | ORAL | 0 refills | Status: DC

## 2018-01-21 NOTE — Patient Instructions (Addendum)
Amoxicillin; Clavulanic Acid tablets What is this medicine? AMOXICILLIN; CLAVULANIC ACID (a mox i SIL in; KLAV yoo lan ic AS id) is a penicillin antibiotic. It is used to treat certain kinds of bacterial infections. It will not work for colds, flu, or other viral infections. This medicine may be used for other purposes; ask your health care provider or pharmacist if you have questions. COMMON BRAND NAME(S): Augmentin What should I tell my health care provider before I take this medicine? They need to know if you have any of these conditions: -bowel disease, like colitis -kidney disease -liver disease -mononucleosis -an unusual or allergic reaction to amoxicillin, penicillin, cephalosporin, other antibiotics, clavulanic acid, other medicines, foods, dyes, or preservatives -pregnant or trying to get pregnant -breast-feeding How should I use this medicine? Take this medicine by mouth with a full glass of water. Follow the directions on the prescription label. Take at the start of a meal. Do not crush or chew. If the tablet has a score line, you may cut it in half at the score line for easier swallowing. Take your medicine at regular intervals. Do not take your medicine more often than directed. Take all of your medicine as directed even if you think you are better. Do not skip doses or stop your medicine early. Talk to your pediatrician regarding the use of this medicine in children. Special care may be needed. Overdosage: If you think you have taken too much of this medicine contact a poison control center or emergency room at once. NOTE: This medicine is only for you. Do not share this medicine with others. What if I miss a dose? If you miss a dose, take it as soon as you can. If it is almost time for your next dose, take only that dose. Do not take double or extra doses. What may interact with this medicine? -allopurinol -anticoagulants -birth control pills -methotrexate -probenecid This  list may not describe all possible interactions. Give your health care provider a list of all the medicines, herbs, non-prescription drugs, or dietary supplements you use. Also tell them if you smoke, drink alcohol, or use illegal drugs. Some items may interact with your medicine. What should I watch for while using this medicine? Tell your doctor or health care professional if your symptoms do not improve. Do not treat diarrhea with over the counter products. Contact your doctor if you have diarrhea that lasts more than 2 days or if it is severe and watery. If you have diabetes, you may get a false-positive result for sugar in your urine. Check with your doctor or health care professional. Birth control pills may not work properly while you are taking this medicine. Talk to your doctor about using an extra method of birth control. What side effects may I notice from receiving this medicine? Side effects that you should report to your doctor or health care professional as soon as possible: -allergic reactions like skin rash, itching or hives, swelling of the face, lips, or tongue -breathing problems -dark urine -fever or chills, sore throat -redness, blistering, peeling or loosening of the skin, including inside the mouth -seizures -trouble passing urine or change in the amount of urine -unusual bleeding, bruising -unusually weak or tired -white patches or sores in the mouth or throat Side effects that usually do not require medical attention (report to your doctor or health care professional if they continue or are bothersome): -diarrhea -dizziness -headache -nausea, vomiting -stomach upset -vaginal or anal irritation This list may   not describe all possible side effects. Call your doctor for medical advice about side effects. You may report side effects to FDA at 1-800-FDA-1088. Where should I keep my medicine? Keep out of the reach of children. Store at room temperature below 25 degrees  C (77 degrees F). Keep container tightly closed. Throw away any unused medicine after the expiration date. NOTE: This sheet is a summary. It may not cover all possible information. If you have questions about this medicine, talk to your doctor, pharmacist, or health care provider.  2018 Elsevier/Gold Standard (2007-06-24 12:04:30) Otitis Externa Otitis externa is an infection of the outer ear canal. The outer ear canal is the area between the outside of the ear and the eardrum. Otitis externa is sometimes called "swimmer's ear." Follow these instructions at home:  If you were given antibiotic ear drops, use them as told by your doctor. Do not stop using them even if your condition gets better.  Take over-the-counter and prescription medicines only as told by your doctor.  Keep all follow-up visits as told by your doctor. This is important. How is this prevented?  Keep your ear dry. Use the corner of a towel to dry your ear after you swim or bathe.  Try not to scratch or put things in your ear. Doing these things makes it easier for germs to grow in your ear.  Avoid swimming in lakes, dirty water, or pools that may not have the right amount of a chemical called chlorine.  Consider making ear drops and putting 3 or 4 drops in each ear after you swim. Ask your doctor about how you can make ear drops. Contact a doctor if:  You have a fever.  After 3 days your ear is still red, swollen, or painful.  After 3 days you still have pus coming from your ear.  Your redness, swelling, or pain gets worse.  You have a really bad headache.  You have redness, swelling, pain, or tenderness behind your ear. This information is not intended to replace advice given to you by your health care provider. Make sure you discuss any questions you have with your health care provider. Document Released: 09/17/2007 Document Revised: 04/26/2015 Document Reviewed: 01/08/2015 Elsevier Interactive Patient  Education  2018 [employer]. Otitis Media, Adult Otitis media is redness, soreness, and puffiness (swelling) in the space just behind your eardrum (middle ear). It may be caused by allergies or infection. It often happens along with a cold. Follow these instructions at home:  Take your medicine as told. Finish it even if you start to feel better.  Only take over-the-counter or prescription medicines for pain, discomfort, or fever as told by your doctor.  Follow up with your doctor as told. Contact a doctor if:  You have otitis media only in one ear, or bleeding from your nose, or both.  You notice a lump on your neck.  You are not getting better in 3-5 days.  You feel worse instead of better. Get help right away if:  You have pain that is not helped with medicine.  You have puffiness, redness, or pain around your ear.  You get a stiff neck.  You cannot move part of your face (paralysis).  You notice that the bone behind your ear hurts when you touch it. This information is not intended to replace advice given to you by your health care provider. Make sure you discuss any questions you have with your health care provider. Document Released: 09/17/2007  Document Revised: 09/06/2015 Document Reviewed: 10/26/2012 Elsevier Interactive Patient Education  2017 [place].   Hydrocortisone; Neomycin; Polymyxin B ear solution What is this medicine? HYDROCORTISONE; NEOMYCIN; and POLYMYXIN B (hye droe KOR ti sone; nee oh MYE sin; pol i MIX in B) is used to treat ear infections. This medicine may be used for other purposes; ask your health care provider or pharmacist if you have questions. COMMON BRAND NAME(S): AK-Spore HC, AK-Spore HC Otic, Antibiotic Otic, Cortisporin, Cortomycin, Oti-Sone, Oticin HC, Otimar, Otocidin What should I tell my health care provider before I take this medicine? They need to know if you have any of these conditions: -any other active infections -chronic  ear infections or fluid in the ear -perforated ear drum -an unusual or allergic reaction to hydrocortisone, neomycin, polymyxin B, sulfites, other medicines, foods, dyes, or preservatives -pregnant or trying to get pregnant -breast-feeding How should I use this medicine? This medicine is only for use in the ears. Follow the directions on the prescription label. Wash hands before and after use. Clean your ear of any fluid that can be easily removed. Do not insert any object or swab into the ear canal. Gently warm the bottle by holding it in the hand for 1 to 2 minutes. Lie down on your side with the infected ear facing upward. Try not to touch the tip of the dropper to your ear, fingertips, or other surface. Squeeze the bottle gently to put the prescribed number of drops in the ear canal. Stay in this position for 30 to 60 seconds to help the drops soak into the ear. Repeat the steps for the other ear if both ears are infected. Do not use your medicine more often than directed. Finish the full course of medicine prescribed by your doctor or health care professional even if you think your condition is better. Talk to your pediatrician regarding the use of this medicine in children. While this drug may be prescribed for selected conditions, precautions do apply. Overdosage: If you think you have taken too much of this medicine contact a poison control center or emergency room at once. NOTE: This medicine is only for you. Do not share this medicine with others. What if I miss a dose? If you miss a dose, use it as soon as you can. If it is almost time for your next dose, use only that dose. Do not take double or extra doses. What may interact with this medicine? Interactions are not expected. Do not use other ear products without talking to your doctor or health care professional. This list may not describe all possible interactions. Give your health care provider a list of all the medicines, herbs,  non-prescription drugs, or dietary supplements you use. Also tell them if you smoke, drink alcohol, or use illegal drugs. Some items may interact with your medicine. What should I watch for while using this medicine? Tell your doctor or health care professional if your ear infection does not get better in a few days. Do not use longer than 10 days unless instructed by your doctor or health care professional. If rash or allergic reaction occurs, stop the product immediately and contact your physician. It is important that you keep the infected ear(s) clean and dry. When bathing, try not to get the infected ear(s) wet. Do not go swimming unless your doctor or health care professional has told you otherwise. To prevent the spread of infection, do not share ear products, or share towels and washcloths with  anyone else. What side effects may I notice from receiving this medicine? Side effects that you should report to your doctor or health care professional as soon as possible: -rash -red, itchy, dry scaly skin at the affected site -worsening ear pain Side effects that usually do not require medical attention (report to your doctor or health care professional if they continue or are bothersome): -abnormal sensation in the ear -burning or stinging while putting the drops in the ear This list may not describe all possible side effects. Call your doctor for medical advice about side effects. You may report side effects to FDA at 1-800-FDA-1088. Where should I keep my medicine? Keep out of the reach of children. Store at room temperature between 15 and 25 degrees C (59 and 77 degrees F). Do not freeze. Throw away any unused medicine after the expiration date. NOTE: This sheet is a summary. It may not cover all possible information. If you have questions about this medicine, talk to your doctor, pharmacist, or health care provider.  2018 Elsevier/Gold Standard (2014-09-19 15:13:48) Prednisone  tablets What is this medicine? PREDNISONE (PRED ni sone) is a corticosteroid. It is commonly used to treat inflammation of the skin, joints, lungs, and other organs. Common conditions treated include asthma, allergies, and arthritis. It is also used for other conditions, such as blood disorders and diseases of the adrenal glands. This medicine may be used for other purposes; ask your health care provider or pharmacist if you have questions. COMMON BRAND NAME(S): Deltasone, Predone, Sterapred, Sterapred DS What should I tell my health care provider before I take this medicine? They need to know if you have any of these conditions: -Cushing's syndrome -diabetes -glaucoma -heart disease -high blood pressure -infection (especially a virus infection such as chickenpox, cold sores, or herpes) -kidney disease -liver disease -mental illness -myasthenia gravis -osteoporosis -seizures -stomach or intestine problems -thyroid disease -an unusual or allergic reaction to lactose, prednisone, other medicines, foods, dyes, or preservatives -pregnant or trying to get pregnant -breast-feeding How should I use this medicine? Take this medicine by mouth with a glass of water. Follow the directions on the prescription label. Take this medicine with food. If you are taking this medicine once a day, take it in the morning. Do not take more medicine than you are told to take. Do not suddenly stop taking your medicine because you may develop a severe reaction. Your doctor will tell you how much medicine to take. If your doctor wants you to stop the medicine, the dose may be slowly lowered over time to avoid any side effects. Talk to your pediatrician regarding the use of this medicine in children. Special care may be needed. Overdosage: If you think you have taken too much of this medicine contact a poison control center or emergency room at once. NOTE: This medicine is only for you. Do not share this medicine  with others. What if I miss a dose? If you miss a dose, take it as soon as you can. If it is almost time for your next dose, talk to your doctor or health care professional. You may need to miss a dose or take an extra dose. Do not take double or extra doses without advice. What may interact with this medicine? Do not take this medicine with any of the following medications: -metyrapone -mifepristone This medicine may also interact with the following medications: -aminoglutethimide -amphotericin B -aspirin and aspirin-like medicines -barbiturates -certain medicines for diabetes, like glipizide or glyburide -cholestyramine -cholinesterase  inhibitors -cyclosporine -digoxin -diuretics -ephedrine -female hormones, like estrogens and birth control pills -isoniazid -ketoconazole -NSAIDS, medicines for pain and inflammation, like ibuprofen or naproxen -phenytoin -rifampin -toxoids -vaccines -warfarin This list may not describe all possible interactions. Give your health care provider a list of all the medicines, herbs, non-prescription drugs, or dietary supplements you use. Also tell them if you smoke, drink alcohol, or use illegal drugs. Some items may interact with your medicine. What should I watch for while using this medicine? Visit your doctor or health care professional for regular checks on your progress. If you are taking this medicine over a prolonged period, carry an identification card with your name and address, the type and dose of your medicine, and your doctor's name and address. This medicine may increase your risk of getting an infection. Tell your doctor or health care professional if you are around anyone with measles or chickenpox, or if you develop sores or blisters that do not heal properly. If you are going to have surgery, tell your doctor or health care professional that you have taken this medicine within the last twelve months. Ask your doctor or health care  professional about your diet. You may need to lower the amount of salt you eat. This medicine may affect blood sugar levels. If you have diabetes, check with your doctor or health care professional before you change your diet or the dose of your diabetic medicine. What side effects may I notice from receiving this medicine? Side effects that you should report to your doctor or health care professional as soon as possible: -allergic reactions like skin rash, itching or hives, swelling of the face, lips, or tongue -changes in emotions or moods -changes in vision -depressed mood -eye pain -fever or chills, cough, sore throat, pain or difficulty passing urine -increased thirst -swelling of ankles, feet Side effects that usually do not require medical attention (report to your doctor or health care professional if they continue or are bothersome): -confusion, excitement, restlessness -headache -nausea, vomiting -skin problems, acne, thin and shiny skin -trouble sleeping -weight gain This list may not describe all possible side effects. Call your doctor for medical advice about side effects. You may report side effects to FDA at 1-800-FDA-1088. Where should I keep my medicine? Keep out of the reach of children. Store at room temperature between 15 and 30 degrees C (59 and 86 degrees F). Protect from light. Keep container tightly closed. Throw away any unused medicine after the expiration date. NOTE: This sheet is a summary. It may not cover all possible information. If you have questions about this medicine, talk to your doctor, pharmacist, or health care provider.  2018 Elsevier/Gold Standard (2010-11-14 10:57:14)

## 2018-01-21 NOTE — Progress Notes (Addendum)
Subjective:     Patient ID: Linda Tran, female   DOB: 10-Nov-1960, 57 y.o.   MRN: [id number]  HPI  Vitals:   01/21/18 0920  BP: 115/80  Pulse: 98  Resp: 16  Temp: 98.1 F (36.7 C)     Patient is a 57 year old female in no acute distress who comes to the clinic with sore throat,  Left ear pressure and pain x 1 week.  She reports last week she ran mild fever. She reports she coughed up greenish/ yellow sputum from post nasal drip. Occasional cough.  Denies any facial swelling. Painful swallowing - able to swallow food and liquids.  Facial pressure started this am in maxillary with leaning over. Denies any facial swelling or teeth pain.   Patient  denies any fever this week or currently, body aches,chills, rash, chest pain, shortness of breath, nausea, vomiting, or diarrhea.   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   Review of Systems  Constitutional: Positive for fatigue. Negative for activity change, appetite change, chills, diaphoresis, fever and unexpected weight change.  HENT: Positive for congestion, dental problem, ear pain, postnasal drip, rhinorrhea, sinus pressure and sore throat. Negative for drooling, ear discharge, facial swelling, hearing loss, mouth sores, nosebleeds, sinus pain, sneezing, tinnitus, trouble swallowing (no trouble swallowing - however hurts to swallow food. ) and voice change.   Eyes: Negative.   Respiratory: Positive for cough. Negative for apnea, choking, chest tightness, shortness of breath, wheezing and stridor.   Cardiovascular: Negative.   Gastrointestinal: Negative.   Endocrine: Negative.   Genitourinary: Negative.   Musculoskeletal: Negative.   Allergic/Immunologic: Positive for environmental allergies (latex ). Negative for food allergies and immunocompromised state.  Neurological: Negative.   Hematological: Negative.   Psychiatric/Behavioral: Negative.         Objective:   Physical Exam  Constitutional: She is oriented to person, place, and time. Vital signs are normal. She appears well-developed and well-nourished. She is active.  Non-toxic appearance. She does not have a sickly appearance. She does not appear ill. No distress.  Patient is alert and oriented and responsive to questions Engages in eye contact with provider. Speaks in full sentences without any pauses without any shortness of breath or distress.  Patient moves on and off of exam table and in room without difficulty. Gait is normal in hall and in room. Patient is oriented to person place time and situation. Patient answers questions appropriately and engages in conversation.   HENT:  Head: Normocephalic and atraumatic.  Right Ear: Hearing, external ear and ear canal normal. Tympanic membrane is not perforated and not erythematous. A middle ear effusion (clear fluid behind normal appearing tympanic membrane ) is present.  Left Ear: There is swelling (canal mildly swollen/ no drainage ) and tenderness. No drainage. No foreign bodies. Tympanic membrane is erythematous (moderate erythema of tympanic membrane with mild bulging / yellow dark fluid behind intact dull tympanic membrane ) and bulging (mild ). Tympanic membrane is not perforated. A middle ear effusion is present. Decreased hearing (" muffled left ear " per patient ) is noted.  Nose: Mucosal edema and rhinorrhea present. Right sinus exhibits maxillary sinus tenderness. Right sinus exhibits no frontal sinus tenderness. Left sinus exhibits maxillary sinus tenderness. Left sinus exhibits no frontal sinus tenderness.  Mouth/Throat: Uvula is midline and mucous membranes are normal. Uvula swelling (very slight ) present. Oropharyngeal exudate and posterior oropharyngeal  erythema present. No posterior oropharyngeal edema or tonsillar abscesses. Tonsils are 0 on the right. Tonsils are 0 on the left. No tonsillar exudate (history of tonsilectomy).   Eyes: Pupils are equal, round, and reactive to light. Conjunctivae, EOM and lids are normal. Lids are everted and swept, no foreign bodies found. Right eye exhibits no discharge. Left eye exhibits no discharge. No scleral icterus.  Neck: Trachea normal, normal range of motion, full passive range of motion without pain and phonation normal. Neck supple. No Brudzinski's sign noted.  Cardiovascular: Normal rate, regular rhythm, normal heart sounds and intact distal pulses. Exam reveals no gallop and no friction rub.  No murmur heard. Pulmonary/Chest: Effort normal and breath sounds normal. No accessory muscle usage or stridor. No respiratory distress. She has no decreased breath sounds. She has no wheezes. She has no rhonchi. She has no rales. She exhibits no tenderness.  Lymphadenopathy:       Head (right side): No submental, no submandibular, no tonsillar, no preauricular, no posterior auricular and no occipital adenopathy present.       Head (left side): No submental, no submandibular, no tonsillar, no preauricular, no posterior auricular and no occipital adenopathy present.    She has no cervical adenopathy.  Neurological: She is alert and oriented to person, place, and time.  Skin: Skin is warm, dry and intact.  Psychiatric: She has a normal mood and affect. Her speech is normal and behavior is normal. Judgment and thought content normal. Cognition and memory are normal.  Vitals reviewed.  No facial swelling or tenderness.  Left ear pain.  No lymphadenopathy.      Assessment:     Non-recurrent acute serous otitis media of left ear  Acute otitis externa of left ear, unspecified type  Acute non-recurrent maxillary sinusitis      Plan:         Meds ordered this encounter  Medications  . amoxicillin-clavulanate (AUGMENTIN) 875-125 MG tablet    Sig: Take 1 tablet by mouth 2 (two) times daily.    Dispense:  20 tablet    Refill:  0  . neomycin-polymyxin-hydrocortisone  (CORTISPORIN) OTIC solution    Sig: Place 3 drops into the left ear 4 (four) times daily.    Dispense:  10 mL    Refill:  0  . predniSONE (STERAPRED UNI-PAK 21 TAB) 10 MG (21) TBPK tablet    Sig: PO: Take 6 tablets on day 1:Take 5 tablets day 2:Take 4 tablets day 3: Take 3 tablets day 4:Take 2 tablets day five: 5 Take 1 tablet day 6    Dispense:  21 tablet    Refill:  0    Please hold for patient   Tylenol or Motrin for pain per package instructions PRN.  Call clinic if any symptoms worsen/ change or new symptoms develop.   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   Note given for work today 01/21/18 as she called out and for 01/22/18- she will return to the office on Monday 10/14 when office reopens by calling or be seen in medical center while office is closed as below.  Advised patient call the office or your primary care doctor for an appointment if no improvement within 72 hours or if any symptoms change or worsen at any time  Advised ER or  urgent Care if after hours or on weekend. Call 911 for emergency symptoms at any time.Patinet verbalized understanding of all instructions given/reviewed and treatment plan and has no further questions or concerns at this time.    Provider also recommends patient see primary care physician for a routine physical and to establish primary care. Patient may chose provider of choice. Also gave the [facility] at [phone] or web site at [email or web address] to help assist with finding a primary care doctor. Patient understands this office is acute care and no longer taking new primary care patients.   Patient verbalized understanding of all instructions given and denies any further questions at this time.

## 2018-01-25 ENCOUNTER — Encounter: Payer: Self-pay | Admitting: Medical

## 2018-01-25 ENCOUNTER — Ambulatory Visit: Payer: Self-pay | Admitting: Medical

## 2018-01-25 VITALS — BP 131/84 | HR 126 | Temp 98.7°F | Resp 18 | Wt 131.0 lb

## 2018-01-25 DIAGNOSIS — H9192 Unspecified hearing loss, left ear: Secondary | ICD-10-CM

## 2018-01-25 NOTE — Patient Instructions (Signed)
Infoarmation only:  Eustachian Tube Dysfunction The eustachian tube connects the middle ear to the back of the nose. It regulates air pressure in the middle ear by allowing air to move between the ear and nose. It also helps to drain fluid from the middle ear space. When the eustachian tube does not function properly, air pressure, fluid, or both can build up in the middle ear. Eustachian tube dysfunction can affect one or both ears. What are the causes? This condition happens when the eustachian tube becomes blocked or cannot open normally. This may result from:  Ear infections.  Colds and other upper respiratory infections.  Allergies.  Irritation, such as from cigarette smoke or acid from the stomach coming up into the esophagus (gastroesophageal reflux).  Sudden changes in air pressure, such as from descending in an airplane.  Abnormal growths in the nose or throat, such as nasal polyps, tumors, or enlarged tissue at the back of the throat (adenoids).  What increases the risk? This condition may be more likely to develop in people who smoke and people who are overweight. Eustachian tube dysfunction may also be more likely to develop in children, especially children who have:  Certain birth defects of the mouth, such as cleft palate.  Large tonsils and adenoids.  What are the signs or symptoms? Symptoms of this condition may include:  A feeling of fullness in the ear.  Ear pain.  Clicking or popping noises in the ear.  Ringing in the ear.  Hearing loss.  Loss of balance.  Symptoms may get worse when the air pressure around you changes, such as when you travel to an area of high elevation or fly on an airplane. How is this diagnosed? This condition may be diagnosed based on:  Your symptoms.  A physical exam of your ear, nose, and throat.  Tests, such as those that measure: ? The movement of your eardrum (tympanogram). ? Your hearing (audiometry).  How is this  treated? Treatment depends on the cause and severity of your condition. If your symptoms are mild, you may be able to relieve your symptoms by moving air into ("popping") your ears. If you have symptoms of fluid in your ears, treatment may include:  Decongestants.  Antihistamines.  Nasal sprays or ear drops that contain medicines that reduce swelling (steroids).  In some cases, you may need to have a procedure to drain the fluid in your eardrum (myringotomy). In this procedure, a small tube is placed in the eardrum to:  Drain the fluid.  Restore the air in the middle ear space.  Follow these instructions at home:  Take over-the-counter and prescription medicines only as told by your health care provider.  Use techniques to help pop your ears as recommended by your health care provider. These may include: ? Chewing gum. ? Yawning. ? Frequent, forceful swallowing. ? Closing your mouth, holding your nose closed, and gently blowing as if you are trying to blow air out of your nose.  Do not do any of the following until your health care provider approves: ? Travel to high altitudes. ? Fly in airplanes. ? Work in a Pension scheme manager or room. ? Scuba dive.  Keep your ears dry. Dry your ears completely after showering or bathing.  Do not smoke.  Keep all follow-up visits as told by your health care provider. This is important. Contact a health care provider if:  Your symptoms do not go away after treatment.  Your symptoms come back after treatment.  You are unable to pop your ears.  You have: ? A fever. ? Pain in your ear. ? Pain in your head or neck. ? Fluid draining from your ear.  Your hearing suddenly changes.  You become very dizzy.  You lose your balance. This information is not intended to replace advice given to you by your health care provider. Make sure you discuss any questions you have with your health care provider. Document Released: 04/27/2015 Document  Revised: 09/06/2015 Document Reviewed: 04/19/2014 Elsevier Interactive Patient Education  2018 [place] [employer] The eardrum is a thin, round tissue inside the ear. It allows you to hear. The eardrum can get torn (perforated). Eardrums often heal on their own. There is often little or no long-term hearing loss. Follow these instructions at home:  Keep your ear dry while it heals. Do not let your head go under water. Do not swim or dive until your doctor says it is okay.  Before you take a bath or shower, do one of these things to keep water out of your ear: ? Put a waterproof earplug in your ear. ? Put petroleum jelly all over a cotton ball. Put the cotton ball in your ear.  Take medicines only as told by your doctor.  Avoid blowing your nose if you can. If you blow your nose, do it gently.  Continue your normal activities after your eardrum heals. Your doctor will tell you when your eardrum has healed.  Talk to your doctor before you fly on an airplane.  Keep all doctor follow-up visits as told by your doctor. This is important. Contact a doctor if:  You have a fever. Get help right away if:  You have blood or yellowish-white fluid (pus) coming from your ear.  You feel dizzy or off balance.  You feel sick to your stomach (nauseous), or you throw up (vomit).  You have more pain. This information is not intended to replace advice given to you by your health care provider. Make sure you discuss any questions you have with your health care provider. Document Released: 09/18/2009 Document Revised: 09/06/2015 Document Reviewed: 11/07/2013 Elsevier Interactive Patient Education  [employer].

## 2018-01-25 NOTE — Progress Notes (Signed)
   Subjective:    Patient ID: Linda Tran, female    DOB: May 02, 1960, 57 y.o.   MRN: [id number]  HPI  57 yo female in non acute distress.  Has had  9 doses of  Augmentin, throat seems "fine".  Able to breath through nose.  Strep looking throat last Thursday.  Most symptoms resolved however muffled ont he left side of ear. . No ear pain. Denies fever and chills, cough or  Chest pain  Blood pressure 131/84, pulse (!) 126, temperature 98.7 F (37.1 C), temperature source Tympanic, resp. rate 18, weight 131 lb (59.4 kg), last menstrual period 11/01/2015, SpO2 97 %.  Allergies  Allergen Reactions  . Latex Rash   Past Medical History:  Diagnosis Date  . Allergy   . Anemia   . Breast cancer ([place]) 2013   left breast, DCIS, radiation  . Personal history of radiation therapy 2013   F/U from lumpectomy for DCIS   Review of Systems  Constitutional: Negative for chills and fever.  HENT: Positive for hearing loss (left , can hear heart beating in ear.). Negative for congestion, ear discharge, ear pain, postnasal drip, rhinorrhea, sinus pressure, sinus pain, sneezing, sore throat, tinnitus, trouble swallowing and voice change.   Eyes: Positive for discharge (left eye only) and itching.  Respiratory: Negative for cough and shortness of breath.   Cardiovascular: Negative for chest pain.  Gastrointestinal: Negative for abdominal pain.  Musculoskeletal: Negative for myalgias.  Skin: Negative for rash.  Allergic/Immunologic: Positive for environmental allergies.  Neurological: Positive for light-headedness (occcasional). Negative for dizziness, syncope and headaches.  Psychiatric/Behavioral: Negative for behavioral problems, self-injury and suicidal ideas.   Has not called about a primary yet. Prednisone upsets stomach,     Objective:   Physical Exam  Constitutional: She is oriented to person, place, and time. She appears well-developed and well-nourished.  HENT:  Head: Normocephalic  and atraumatic.  Right Ear: External ear normal.  Left Ear: External ear normal.  Nose: Nose normal.  Mouth/Throat: Oropharynx is clear and moist.  Eyes: Pupils are equal, round, and reactive to light. Conjunctivae and EOM are normal.  Neck: Normal range of motion. Neck supple.  Cardiovascular: Normal rate, regular rhythm and normal heart sounds.  Pulmonary/Chest: Effort normal and breath sounds normal.  Lymphadenopathy:    She has no cervical adenopathy.  Neurological: She is alert and oriented to person, place, and time.  Skin: Skin is warm and dry.  Psychiatric: She has a normal mood and affect. Her behavior is normal. Judgment and thought content normal.  Nursing note and vitals reviewed.        112 on recheck of heart rate. Assessment & Plan:  Bulguing left tympanic membrane Start Prednisone at  1/2 dose, take with food.Call if any problems with medication. Continue and finish antibiotcs.  Start OTC  Flonase and Zyrtec take as directed. To call about establishing for primary care doctor , Patient given [place] Referral line information.  Patient verbalizes understanding and has no questions at discharge.

## 2018-01-27 DIAGNOSIS — J301 Allergic rhinitis due to pollen: Secondary | ICD-10-CM | POA: Diagnosis not present

## 2018-01-27 DIAGNOSIS — H6502 Acute serous otitis media, left ear: Secondary | ICD-10-CM | POA: Diagnosis not present

## 2018-01-28 ENCOUNTER — Ambulatory Visit: Payer: Self-pay | Admitting: Adult Health

## 2018-02-19 ENCOUNTER — Emergency Department
Admission: EM | Admit: 2018-02-19 | Discharge: 2018-02-19 | Disposition: A | Discharge status: Home / Self Care | Payer: BLUE CROSS/BLUE SHIELD | Attending: Emergency Medicine | Admitting: Emergency Medicine

## 2018-02-19 ENCOUNTER — Ambulatory Visit: Payer: Self-pay | Admitting: Adult Health

## 2018-02-19 ENCOUNTER — Emergency Department: Payer: BLUE CROSS/BLUE SHIELD

## 2018-02-19 ENCOUNTER — Encounter: Payer: Self-pay | Admitting: Adult Health

## 2018-02-19 VITALS — BP 127/69 | HR 98 | Temp 98.0°F | Resp 18 | Ht 62.0 in | Wt 131.0 lb

## 2018-02-19 DIAGNOSIS — R51 Headache: Secondary | ICD-10-CM | POA: Diagnosis not present

## 2018-02-19 DIAGNOSIS — M79642 Pain in left hand: Secondary | ICD-10-CM | POA: Diagnosis not present

## 2018-02-19 DIAGNOSIS — M79671 Pain in right foot: Secondary | ICD-10-CM | POA: Diagnosis not present

## 2018-02-19 DIAGNOSIS — M79672 Pain in left foot: Secondary | ICD-10-CM | POA: Insufficient documentation

## 2018-02-19 DIAGNOSIS — F17210 Nicotine dependence, cigarettes, uncomplicated: Secondary | ICD-10-CM | POA: Insufficient documentation

## 2018-02-19 DIAGNOSIS — L539 Erythematous condition, unspecified: Secondary | ICD-10-CM | POA: Insufficient documentation

## 2018-02-19 DIAGNOSIS — M2550 Pain in unspecified joint: Secondary | ICD-10-CM | POA: Insufficient documentation

## 2018-02-19 DIAGNOSIS — L0591 Pilonidal cyst without abscess: Secondary | ICD-10-CM | POA: Insufficient documentation

## 2018-02-19 DIAGNOSIS — R609 Edema, unspecified: Secondary | ICD-10-CM | POA: Insufficient documentation

## 2018-02-19 DIAGNOSIS — R0602 Shortness of breath: Secondary | ICD-10-CM | POA: Diagnosis not present

## 2018-02-19 DIAGNOSIS — R2231 Localized swelling, mass and lump, right upper limb: Secondary | ICD-10-CM | POA: Diagnosis not present

## 2018-02-19 DIAGNOSIS — M79641 Pain in right hand: Secondary | ICD-10-CM | POA: Diagnosis present

## 2018-02-19 DIAGNOSIS — R2232 Localized swelling, mass and lump, left upper limb: Secondary | ICD-10-CM | POA: Diagnosis not present

## 2018-02-19 DIAGNOSIS — M7989 Other specified soft tissue disorders: Secondary | ICD-10-CM

## 2018-02-19 LAB — BASIC METABOLIC PANEL
Anion gap: 11 (ref 5–15)
BUN: 8 mg/dL (ref 6–20)
CALCIUM: 9.3 mg/dL (ref 8.9–10.3)
CO2: 23 mmol/L (ref 22–32)
Chloride: 105 mmol/L (ref 98–111)
Creatinine, Ser: 0.68 mg/dL (ref 0.44–1.00)
Glucose, Bld: 139 mg/dL — ABNORMAL HIGH (ref 70–99)
Potassium: 3.5 mmol/L (ref 3.5–5.1)
SODIUM: 139 mmol/L (ref 135–145)

## 2018-02-19 LAB — CBC WITH DIFFERENTIAL/PLATELET
Abs Immature Granulocytes: 0.03 10*3/uL (ref 0.00–0.07)
BASOS ABS: 0 10*3/uL (ref 0.0–0.1)
Basophils Relative: 0 %
EOS ABS: 0.1 10*3/uL (ref 0.0–0.5)
Eosinophils Relative: 1 %
HCT: 39.9 % (ref 36.0–46.0)
Hemoglobin: 12.4 g/dL (ref 12.0–15.0)
Immature Granulocytes: 0 %
Lymphocytes Relative: 11 %
Lymphs Abs: 1.2 10*3/uL (ref 0.7–4.0)
MCH: 25.5 pg — ABNORMAL LOW (ref 26.0–34.0)
MCHC: 31.1 g/dL (ref 30.0–36.0)
MCV: 82.1 fL (ref 80.0–100.0)
Monocytes Absolute: 0.5 10*3/uL (ref 0.1–1.0)
Monocytes Relative: 5 %
NRBC: 0 % (ref 0.0–0.2)
Neutro Abs: 9 10*3/uL — ABNORMAL HIGH (ref 1.7–7.7)
Neutrophils Relative %: 83 %
Platelets: 346 10*3/uL (ref 150–400)
RBC: 4.86 MIL/uL (ref 3.87–5.11)
RDW: 16.6 % — ABNORMAL HIGH (ref 11.5–15.5)
WBC: 10.8 10*3/uL — AB (ref 4.0–10.5)

## 2018-02-19 MED ORDER — FAMOTIDINE 20 MG PO TABS
20.0000 mg | ORAL_TABLET | Freq: Once | ORAL | Status: AC
  Administered 2018-02-19: 20 mg via ORAL
  Filled 2018-02-19: qty 1

## 2018-02-19 MED ORDER — DIPHENHYDRAMINE HCL 25 MG PO CAPS
ORAL_CAPSULE | ORAL | Status: AC
  Administered 2018-02-19: 25 mg
  Filled 2018-02-19: qty 1

## 2018-02-19 MED ORDER — PREDNISONE 20 MG PO TABS: 60.0000 mg | ORAL_TABLET | Freq: Every day | ORAL | 0 refills | Status: DC

## 2018-02-19 MED ORDER — SODIUM CHLORIDE 0.9 % IV SOLN: 25.0000 mg | INTRAVENOUS | 0 refills | Status: DC | PRN

## 2018-02-19 MED ORDER — ONDANSETRON 4 MG PO TBDP
ORAL_TABLET | ORAL | Status: AC
  Administered 2018-02-19: 4 mg
  Filled 2018-02-19: qty 1

## 2018-02-19 MED ORDER — METHYLPREDNISOLONE SODIUM SUCC 125 MG IJ SOLR
INTRAMUSCULAR | Status: AC
  Administered 2018-02-19: 125 mg
  Filled 2018-02-19: qty 2

## 2018-02-19 MED ORDER — FAMOTIDINE 20 MG PO TABS: 20.0000 mg | ORAL_TABLET | Freq: Two times a day (BID) | ORAL | 0 refills | Status: DC

## 2018-02-19 MED ORDER — DIPHENHYDRAMINE HCL 50 MG/ML IJ SOLN
50.0000 mg | Freq: Once | INTRAMUSCULAR | Status: AC
  Administered 2018-02-19: 50 mg via INTRAVENOUS
  Filled 2018-02-19: qty 1

## 2018-02-19 MED ORDER — EPINEPHRINE 0.3 MG/0.3ML IJ SOAJ: 0.3000 mg | Freq: Once | INTRAMUSCULAR | 0 refills | Status: AC

## 2018-02-19 MED ORDER — DIPHENHYDRAMINE HCL 25 MG PO TABS: 25.0000 mg | ORAL_TABLET | Freq: Four times a day (QID) | ORAL | 0 refills | Status: DC | PRN

## 2018-02-19 MED ORDER — PREDNISONE 20 MG PO TABS
60.0000 mg | ORAL_TABLET | Freq: Once | ORAL | Status: DC
Start: 2018-02-19 — End: 2018-02-19
  Filled 2018-02-19: qty 3

## 2018-02-19 NOTE — ED Notes (Signed)
Pt has noticeable swelling in both extremities.

## 2018-02-19 NOTE — Progress Notes (Addendum)
Subjective:     Patient ID: Linda Tran, female   DOB: [birth year], 57 y.o.   MRN: [id number]  HPI   Blood pressure 127/69, pulse 98, temperature 98 F (36.7 C), resp. rate 18, height 5\' 2"  (1.575 m), weight 131 lb (59.4 kg), last menstrual period 11/01/2015, SpO2 98 %.   Patient is a 57 year old female in no acute distress who comes to the clinic for complaint of swelling in her hands bilaterally, painful area buttocks, swelling in heels, joint pain.  Abdominal " burning" She reports onset of  All symptoms last pm in the middle of the night and she has been at home resting since this started until this 12 pm appointment today. She reports she drove her self to the clinic though it was hard to turn the steering wheel with the swelling in her hands- hard to bend fingers. " hurts to walk due to heel swelling". She denies any difficulty swallowing or changes in voice. Denies throat swelling.   Denies ever having this in past.  She has had joint injection of her shoulder left in past " two months ". She has been on prednisone by orthopedics. Multiple visits to orthopedics. She has history of multiple joint injuries/ pain and is followed by orthopedics.   10/14 was on Augmentin for otitis media- she reports she stopped Augmentin two weeks ago - denies any problems with medication and all symptoms resolved that she was being treated for.   Denies any new medications prescription or over the counter. No known exposures.  Denies any known tick bite.   Patient  denies any fever,chills, rash, chest pain, shortness of breath, nausea, vomiting, or diarrhea.   Allergies  Allergen Reactions  . Latex Rash    Review of Systems  Constitutional: Positive for activity change (been resting due to hand swelling. ) and fatigue. Negative for appetite change, chills, diaphoresis, fever and unexpected weight change.  HENT: Negative.   Respiratory: Positive for shortness of breath (she reported to nusrsing she  has felt short of breath as coming into office but feels this was a panic attack and has resolved. ). Negative for apnea, cough, choking, chest tightness, wheezing and stridor.   Cardiovascular: Positive for leg swelling (heel bilateral swelling per patient. ). Negative for chest pain and palpitations.  Gastrointestinal: Positive for abdominal pain (denies pain " burning " lower abdomen ). Negative for abdominal distention, anal bleeding, blood in stool, constipation, diarrhea, nausea, rectal pain and vomiting.  Genitourinary: Negative.   Musculoskeletal: Positive for arthralgias (chronic ) and joint swelling. Negative for back pain, gait problem, myalgias, neck pain and neck stiffness.       Multiple histories of injuries at work.   Skin: Positive for rash (area at nape of neck and on buttocks per patient ). Negative for color change, pallor and wound.  Neurological: Negative.   Hematological: Negative.        History of anemia - she has not returned for lab work as instructed and reports to provider at today's visit that she has not been taking iron.   Psychiatric/Behavioral: Negative.        Objective:   Physical Exam  Constitutional: She is oriented to person, place, and time. Vital signs are normal. She appears well-developed and well-nourished. She is active and cooperative.  Non-toxic appearance. She does not have a sickly appearance. She appears ill. No distress.  Patient is alert and oriented and responsive to questions Engages in eye contact  with provider. Speaks in full sentences without any pauses without any shortness of breath or distress.   No facial swelling. No eye swelling.   HENT:  Head: Normocephalic and atraumatic.  Nose: Nose normal.  Mouth/Throat: Uvula is midline, oropharynx is clear and moist and mucous membranes are normal. No uvula swelling. No oropharyngeal exudate.  NO lip or tongue swelling. Uvula no swelling. Airway patent. O2 saturation 98-99%   She is a  smoker.   Eyes: Pupils are equal, round, and reactive to light. Conjunctivae and EOM are normal.  Neck: Trachea normal, normal range of motion, full passive range of motion without pain and phonation normal. Neck supple. No hepatojugular reflux and no JVD present. No tracheal tenderness present. No tracheal deviation, no erythema and normal range of motion present. No Brudzinski's sign noted.    Mild pink flat rash scattered  nape of neck / hairline - blanches. Appears to be resolving be resolving while in room.    Oral pharynx open and patent.  Patient in no acute distress, speaking in full sentences, denies throat swelling or changes in voice. She has been able to swallow food and liquid without any difficulty.    Cardiovascular: Normal rate, regular rhythm, normal heart sounds and intact distal pulses. Exam reveals no gallop and no friction rub.  No murmur heard. Pulses:      Dorsalis pedis pulses are 2+ on the right side, and 2+ on the left side.       Posterior tibial pulses are 2+ on the right side, and 2+ on the left side.  Pulmonary/Chest: Effort normal and breath sounds normal. No accessory muscle usage or stridor. No apnea, no tachypnea and no bradypnea. No respiratory distress. She has no decreased breath sounds. She has no wheezes. She has no rhonchi. She has no rales. She exhibits no tenderness.  Abdominal: Soft. Normal appearance and normal aorta. She exhibits no distension and no mass. There is no tenderness. There is no rigidity, no rebound and no guarding. No hernia.    Patient points to area  Marked in red area on diagram and states intermittent burning in her abdominal area.   She has no rash anywhere other than nape of neck and this is resolving.  Musculoskeletal:       Right hand: She exhibits swelling (bilateral hand swelling all digits palms all over ).       Left hand: She exhibits swelling.       Hands:      Right foot: There is swelling (2 + bilateral heel  swelling dorsal only - painful gait ).       Left foot: There is swelling.  Patient moves on and off of exam table and in room without difficulty. Gait is normal in hall and in room. Patient is oriented to person place time and situation. Patient answers questions appropriately and engages in conversation.   Feet:  Right Foot:  Skin Integrity: Negative for skin breakdown.  Left Foot:  Skin Integrity: Negative for skin breakdown.  Lymphadenopathy:    She has no cervical adenopathy.  Neurological: She is alert and oriented to person, place, and time. She has normal strength. GCS eye subscore is 4. GCS verbal subscore is 5. GCS motor subscore is 6.  Skin: Skin is warm, dry and intact. Capillary refill takes less than 2 seconds. No rash noted. She is not diaphoretic. There is erythema (erythema as marked on neck drawing- mild and posteriior only ). Nails show no  clubbing.     As documented on diagram.   Right of coccyx firm area palpated, painful to touch, movable, with erythema of skin and warmth noted approximately 1.5cm in size.    Bilateral hands swelling as below, skin warm, scattered ,mild erythema.  Patient reports hands itch bilateral last night.  Right 2+ pitting edema  Left hand 3+ pitting edema   2+ bilateral radial pulse.  2+ dorsalis pedal pulses  1+ bilateral heel edema noted, skin normal color and temperature  Affects gait- limps with walking.   Psychiatric: Her speech is normal and behavior is normal. Judgment normal. Her mood appears anxious. Her affect is not angry, not blunt, not labile and not inappropriate. Cognition and memory are normal. She does not exhibit a depressed mood.       Assessment:     Edema, unspecified type- hands and feet bilaterally  Cyst possible abcess skin  near coccyx- right side   Erythema- coccyx skin surronding cyst/ abscess   Recurrent joint pain      Plan:     Advised given multiple differentials and severe bilateral hand and  heel swelling differentials possible but not limited toallergy, gout, septic arthritis, sepsis, serum sickness, autoimmune, tick borne illness, abscess or cyst coccyx she needs to be evaluated in emergency room. She is advised provider would like to administer benadryl to cover an unknown allergic reaction - and other allergy medications but 911 will have to be called as she can not self drive. Patient refused and signed AMA form and chose to drive to the emergency room.    Advised not to self drive- she reports she has no one to drive her. Advised given abdominal discomfort and decreased dexterity due to hand swelling  and possible shortness of breath  Earlier in the day she  911 NOW is  recommended.  Patient refused and signed AMA ( Against Medical Advice ) form and will self drive. Form given to Belinda Day to label and to Mallory Shirk RN to scan to chart. Witness to [place] form was Nelda Severe RN   She is also reminded she needs to seek primary care immediately after emergency visit for follow up on chronic issues.   Establish care with a primary care provider as discussed multiple office visits prior.  She is aware she is overdue for labs- CBC with differential and Iron, TIBC, Ferritin. She is aware that this is an acute clnic and she needs a primary care physician.   Provider also recommends patient see primary care physician for a routine physical and to establish primary care. Patient may chose provider of choice. Also gave the [place] at [phone] or web site at [email or web address] to help assist with finding a primary care doctor. Patient understands this office is acute care and no longer taking new primary care patients

## 2018-02-19 NOTE — ED Provider Notes (Signed)
[facility] Emergency Department Provider Note  ____________________________________________   I have reviewed the triage vital signs and the nursing notes. Where available I have reviewed prior notes and, if possible and indicated, outside hospital notes.    HISTORY  Chief Complaint No chief complaint on file.    HPI Linda Tran is a 57 y.o. female  Patient states that she has had pain to her hands for the last day or so.  Bilateral hands base of bilateral feet and a little bit behind her head.  She states also that she has an itchy area to the area of her lower back.  This is after taking Monistat.  She has no chest pain she has no shortness of breath she has no tongue swelling she has no other anaphylactic results.  She did receive Benadryl and seems seem to improve.  She is never had anything like this before.  She states she does not want to be admitted to the hospital.  No tongue or lip swelling.    Past Medical History:  Diagnosis Date  . Allergy   . Anemia   . Breast cancer ([place]) 2013   left breast, DCIS, radiation  . Personal history of radiation therapy 2013   F/U from lumpectomy for DCIS    Patient Active Problem List   Diagnosis Date Noted  . Edema 02/19/2018  . Cyst possible abcess skin  near coccyx- right side  02/19/2018  . Recurrent joint pain 02/19/2018  . Erythema- coccyx skin surronding cyst/ abscess  02/19/2018  . Pain of left hand 06/03/2017  . Acquired trigger finger of left middle finger 05/06/2017  . Hand injury, left, sequela 03/12/2017  . Anemia 10/31/2016  . Suspected exposure to mold 10/31/2016  . Takes iron supplements 10/23/2016    Past Surgical History:  Procedure Laterality Date  . BREAST BIOPSY Left 01/12/12   positive,DCIS  . BREAST BIOPSY Right 2012   negative  . BREAST BIOPSY Right 2017   2 MM INTRADUCTAL PAPILLOMA.   Marland Kitchen BREAST CYST ASPIRATION Left negative  . BREAST EXCISIONAL BIOPSY Left 01/19/12    positve, DCIS  . BREAST LUMPECTOMY Left 2013   DCIS, lumpectomy F/U radiation   . BREAST SURGERY    . CESAREAN SECTION    . COLONOSCOPY    . POLYPECTOMY     hyperplastic polyps  . TUBAL LIGATION      Prior to Admission medications   Medication Sig Start Date End Date Taking? Authorizing Provider  fluticasone (FLONASE) 50 MCG/ACT nasal spray Place 2 sprays into both nostrils daily. Patient not taking: Reported on 01/25/2018 06/17/17   Daryll Drown R, PA-C  Iron-Vitamin C (VITRON-C) 65-125 MG TABS Take 65-125 mg by mouth daily. Recheck labs in 3 months , call for lab appointment Patient not taking: Reported on 02/19/2018 07/09/17   Flinchum, Kelby Aline, FNP  neomycin-polymyxin-hydrocortisone (CORTISPORIN) OTIC solution Place 3 drops into the left ear 4 (four) times daily. Patient not taking: Reported on 02/19/2018 01/21/18   Flinchum, Kelby Aline, FNP  predniSONE (DELTASONE) 10 MG tablet prednisone 10 mg tablet    [provider]  Pseudoeph-Doxylamine-DM-APAP (NYQUIL PO) Take by mouth.    [provider]    Allergies Latex  Family History  Problem Relation Age of Onset  . Heart disease Father 40       AMI  . Colon cancer Neg Hx   . Colon polyps Neg Hx   . Stomach cancer Neg Hx   .  Rectal cancer Neg Hx   . Esophageal cancer Neg Hx     Social History Social History   Tobacco Use  . Smoking status: Current Every Day Smoker    Packs/day: 1.00    Types: Cigarettes  . Smokeless tobacco: Never Used  Substance Use Topics  . Alcohol use: Yes    Comment: social  . Drug use: No    Review of Systems Constitutional: No fever/chills Eyes: No visual changes. ENT: No sore throat. No stiff neck no neck pain Cardiovascular: Denies chest pain. Respiratory: Denies shortness of breath. Gastrointestinal:   no vomiting.  No diarrhea.  No constipation. Genitourinary: Negative for dysuria. Musculoskeletal: Negative lower extremity swelling Skin: Negative for  rash. Neurological: Negative for severe headaches, focal weakness or numbness.   ____________________________________________   PHYSICAL EXAM:  VITAL SIGNS: ED Triage Vitals [02/19/18 1331]  Enc Vitals Group     BP 118/71     Pulse Rate 98     Resp      Temp 98 F (36.7 C)     Temp Source Oral     SpO2 100 %     Weight      Height      Head Circumference      Peak Flow      Pain Score      Pain Loc      Pain Edu?      Excl. in [place]?     Constitutional: Alert and oriented. Well appearing and in no acute distress. Eyes: Conjunctivae are normal Head: Atraumatic HEENT: No congestion/rhinnorhea. Mucous membranes are moist.  Oropharynx non-erythematous Neck:   Nontender with no meningismus, no masses, no stridor Cardiovascular: Normal rate, regular rhythm. Grossly normal heart sounds.  Good peripheral circulation. Respiratory: Normal respiratory effort.  No retractions. Lungs CTAB. Abdominal: Soft and nontender. No distention. No guarding no rebound Back:  There is no focal tenderness or step off.  there is no midline tenderness there are no lesions noted. there is no CVA tenderness Musculoskeletal: No lower extremity tenderness, no upper extremity tenderness. No joint effusions, no DVT signs strong distal pulses no edema see below, skin Neurologic:  Normal speech and language. No gross focal neurologic deficits are appreciated.  Skin:  Skin is warm, dry and intact.  Lateral hands are somewhat swollen and itchy, there is good pulses to each hand, patient is scratching them actively in the department.  The bottom of her soles are slightly red but there is no rash.  There is no rash on the hands either this is swelling and some mild erythema where she is been scratching. Psychiatric: Mood and affect are normal. Speech and behavior are normal.  ____________________________________________   LABS (all labs ordered are listed, but only abnormal results are displayed)  Labs Reviewed   BASIC METABOLIC PANEL - Abnormal; Notable for the following components:      Result Value   Glucose, Bld 139 (*)    All other components within normal limits  CBC WITH DIFFERENTIAL/PLATELET - Abnormal; Notable for the following components:   WBC 10.8 (*)    MCH 25.5 (*)    RDW 16.6 (*)    Neutro Abs 9.0 (*)    All other components within normal limits  POC URINE PREG, ED  POC URINE PREG, ED    Pertinent labs  results that were available during my care of the patient were reviewed by me and considered in my medical decision making (see chart for details). ____________________________________________  EKG  I personally interpreted any EKGs ordered by me or triage  ____________________________________________  RADIOLOGY  Pertinent labs & imaging results that were available during my care of the patient were reviewed by me and considered in my medical decision making (see chart for details). If possible, patient and/or family made aware of any abnormal findings.  Dg Chest 2 View  Result Date: 02/19/2018 CLINICAL DATA:  Shortness of breath. EXAM: CHEST - 2 VIEW COMPARISON:  Aug 20, 2017 FINDINGS: The heart size and mediastinal contours are within normal limits. There is no focal infiltrate, pulmonary edema, or pleural effusion. Scoliosis of the spine is identified. IMPRESSION: No active cardiopulmonary disease. Electronically Signed   By: Abelardo Diesel M.D.   On: 02/19/2018 14:31   ____________________________________________    PROCEDURES  Procedure(s) performed: None  Procedures  Critical Care performed: None  ____________________________________________   INITIAL IMPRESSION / ASSESSMENT AND PLAN / ED COURSE  Pertinent labs & imaging results that were available during my care of the patient were reviewed by me and considered in my medical decision making (see chart for details).  Patient is having some possibly allergic reaction with bilateral hand swelling, nothing at  this time to suggest [facility] spotted fever there is no rash or exposure to ticks, there is just bilateral equal swelling, certainly could be consistent with an atypical allergic reaction, id reaction, or serum sickness but she is quite well-appearing and after Benadryl she is no longer itching and the swelling seems to be greatly improving.  We have watched her here in the department for prolonged period of time, and there is no evidence of progression to anaphylaxis.  Given that she declines admission is already called for a ride, we will discharge her on steroids to see if it gets better, return precautions were given understood we will also send her home with an EpiPen.  This does not appear to be infectious, does appear to be a allergic reaction we have advised her not to take Monistat or anything similar, she will follow close with PCP     ____________________________________________   FINAL CLINICAL IMPRESSION(S) / ED DIAGNOSES  Final diagnoses:  SOB (shortness of breath)      This chart was dictated using voice recognition software.  Despite best efforts to proofread,  errors can occur which can change meaning.      Schuyler Amor, MD 02/19/18 [phone]

## 2018-02-19 NOTE — Progress Notes (Deleted)
  Subjective:     Patient ID: Linda Tran, female   DOB: Oct 09, 1960, 57 y.o.   MRN: [id number]    Patient is a 57 year old female in no acute distress who comes to the clinic for complaint of swelling in her hands bilaterally, painful area buttocks, swelling in heels, joint pain.  Abdominal " burning" She reports onset of symptoms last pm in the middle of the night.   10/14 was on Augmentin for otitis media.                                                                                  She has swelling  Hand and heels. Reports left and right and left hand.     Allergies  Allergen Reactions  . Latex Rash     Review of Systems  Skin: Positive for rash.       Objective:   Physical Exam     Assessment:     ***    Plan:       Establish care with a primary care provider as discussed multiple office visits.   She is aware she is overdue for labs- CBC with differential and Iron, TIBC, Ferritin. She is aware that this is an acute clnic and she needs a primary care physician.   Provider also recommends patient see primary care physician for a routine physical and to establish primary care. Patient may chose provider of choice. Also gave the [facility] at [phone] or web site at [email or web address] to help assist with finding a primary care doctor. Patient understands this office is acute care and no longer taking new primary care patients.

## 2018-02-19 NOTE — ED Notes (Signed)
Pt updated about wait time by Anderson Malta, RN

## 2018-02-19 NOTE — Discharge Instructions (Addendum)
Take the steroids until they are gone use the Benadryl and the Pepcid do not drive tonight after the medications we have given you, if you have tongue swelling lip swelling throat swelling, shortness of breath, or worsening symptoms return to the emergency room otherwise, follow close with primary care doctor and allergist as listed above for an allergist that your primary care provider would prefer that you see.  Do not take Monistat or anything containing the medication contained in Monistat again.

## 2018-02-19 NOTE — ED Triage Notes (Signed)
Pt states last mth she was dx with strep infection on the L ear.Pt had yeast infection and took monistat. Pt states back of neck itching. Pt states whole body swelling.

## 2018-02-19 NOTE — ED Notes (Signed)
Pt was updated on the wait as well. Pt hands remain swollen with plus 3 edema. Pt rash is the same as it was on the interior left arm. Pt is NAD and VSS. awating room assignment in the ED at this time.

## 2018-02-19 NOTE — Patient Instructions (Addendum)
As discussed recommend patient have someone drive her to the emergency room or utilize 911 now- patient chooses to drive. AMA formed signed.   As discussed Production assistant, radio and Wellness recommends you establish primary care with a primary care physcian of your choice as this is an acute clinic and we are no longer accepting new primary care patients at this time.  To assist you can use the number provided below to locate a physician. Provider also recommends patient see primary care physician for a routine physical and to establish primary care. Patient may chose provider of choice. Also gave the [place] at [phone] or web site at [email or web address] to help assist with finding a primary care doctor. Patient understands this office is acute care and no longer taking new primary care patients. You are over due for labs to check your iron level please schedule this appointment.

## 2018-02-19 NOTE — ED Notes (Signed)
Spoke with pt about wait times and what to expect next. Advised pt that I am available for further questions if needed.  

## 2018-02-19 NOTE — ED Notes (Signed)
Pt states she has a sore "cyst" on the lower back upper bottom area.

## 2018-02-19 NOTE — ED Notes (Signed)
Pt states she took benadryl 25 mg at 0830 with no reduction.

## 2018-02-22 ENCOUNTER — Telehealth: Payer: Self-pay

## 2018-02-22 DIAGNOSIS — R21 Rash and other nonspecific skin eruption: Secondary | ICD-10-CM | POA: Diagnosis not present

## 2018-02-22 NOTE — Telephone Encounter (Signed)
Pt called to say she was not happy with her care in the ER at [facility] Friday; states no one reviewed her labs or EKG with her; states symptoms have worsened; discussed with Chalmers Cater, NP and H. Ratcliffe, [place]; instructed to return to the ER  Promptly and  as directed on her discharge instructions; pt states she did not follow up with [facility] clinic or allergist as directed on discharged instructions; discussed importance of following medical advice by providers for her safety; pt hesitant but verbalized understanding; again instructed to go to closest ER now

## 2018-03-01 NOTE — Progress Notes (Signed)
Patient has been notified multiple times in office and by phone that she is overdue for lab work - she has declined. . She reported at last office visit she is not taking her iron. Noncompliance with treatment ttherapy.   She has also been advised to find a primary care clnic since we are no longer accepting new primary care patients and  Has been given the information multiple times as well as described the risk versus benefits. Patient verbalized understanding of all instructions given and denies any further questions at this time.    Provider also recommends patient see primary care physician for a routine physical and to establish primary care. Patient may chose provider of choice. Also gave the [place] at [phone] or web site at [email or web address] to help assist with finding a primary care doctor. Patient understands this office is acute care and no longer taking new primary care patients. Patient verbalized understanding of all instructions given and denies any further questions at this time.

## 2018-03-09 ENCOUNTER — Encounter: Payer: Self-pay | Admitting: Family Medicine

## 2018-03-09 ENCOUNTER — Telehealth: Payer: Self-pay

## 2018-03-09 ENCOUNTER — Ambulatory Visit: Payer: BLUE CROSS/BLUE SHIELD | Admitting: Family Medicine

## 2018-03-09 VITALS — BP 104/66 | HR 99 | Temp 98.2°F | Resp 12 | Ht 62.0 in | Wt 129.8 lb

## 2018-03-09 DIAGNOSIS — Z853 Personal history of malignant neoplasm of breast: Secondary | ICD-10-CM | POA: Insufficient documentation

## 2018-03-09 DIAGNOSIS — T7840XD Allergy, unspecified, subsequent encounter: Secondary | ICD-10-CM

## 2018-03-09 DIAGNOSIS — R0989 Other specified symptoms and signs involving the circulatory and respiratory systems: Secondary | ICD-10-CM

## 2018-03-09 DIAGNOSIS — Z720 Tobacco use: Secondary | ICD-10-CM | POA: Diagnosis not present

## 2018-03-09 NOTE — Assessment & Plan Note (Signed)
Failed Chantax and previously tried patches w/o success. Will continue to address readiness.

## 2018-03-09 NOTE — Patient Instructions (Addendum)
We will get labs from your other clinic and most recent PAP results   Get the ultrasound of your neck  Consider quitting smoking. I'll follow-up once I have the results of your blood work and your ultrasound

## 2018-03-09 NOTE — Telephone Encounter (Signed)
Patient has been going to [facility] and wellness clinic for any issues she had including immunizations. I called that office and spoke with Theadora Rama, nurse there, she was able to provide patient's immunization record for Tdap and Hepatitis B (patient did not come to complete the series). Theadora Rama is not able to print and fax over the record to be scanned due to the system this was updated in was the old system they were using and she is not able to print things out from it. If we ever need to call and confirm their phone number is [phone].

## 2018-03-09 NOTE — Progress Notes (Signed)
Subjective:     Linda Tran is a 57 y.o. female presenting for Establish Care (previous PCP with [facility] through work) and Edema (on 02/18/2018-thought she got bit by a fruit bug or something on the back of her neck. She developed swelling the following day in her hands and legs at which point she went to occupational office and then went to ER for this. Notes in the chart. Swelling kept moving around to the nose, and ears and eyes felt swollen. This is all better now and she wants to address the referral to allergist that she was set up for by Gay Filler at [facility])     HPI  #Hand swelling/edema - was in a house at work - had an itch on the back of her neck which then swelled - took aleve and went to bed - then the next more had swelling in hands and feet - went to wellness center at [facility] - also having some abdominal cramping - no n/v - went to the ER -- feeling hot on the buttock and had a welt/itchy spot on the buttock - was given steroid and benedryl and pepcid > stayed for 12 hours  - swelling moved to ears - exterior ear, cracking but no bleeding and itching - asked pharmacist suggested preparation H and bathe with head and shoulders -- this worked!  Most recently - will feel hot but otherwise is doing better  Saw Gay Filler and was referred to [facility] and allergist    No longer taking omeprazole  Thinks she may have gone though menopause while on treatment for breast cancer - no period x 2 years  #tobacco use - tried chantax  -- but had side effects - has not called the quitline - tried the patches  Review of Systems  Chart Review 11/8 - ER for possible allergic reaction, started on prednisone 11/11- urgent care - mild anemia, derm referral 11/15 - telephone - added omeprazole for "funny sensation"  Social History   Tobacco Use  Smoking Status Current Every Day Smoker  . Packs/day: 1.00  . Years: 35.00  . Pack years: 35.00  .  Types: Cigarettes  Smokeless Tobacco Never Used        Objective:    BP Readings from Last 3 Encounters:  03/09/18 104/66  02/19/18 124/75  02/19/18 127/69   Wt Readings from Last 3 Encounters:  03/09/18 129 lb 12 oz (58.9 kg)  02/19/18 131 lb (59.4 kg)  01/25/18 131 lb (59.4 kg)    BP 104/66   Pulse 99   Temp 98.2 F (36.8 C)   Resp 12   Ht 5\' 2"  (1.575 m)   Wt 129 lb 12 oz (58.9 kg)   LMP 11/01/2015   SpO2 95%   BMI 23.73 kg/m    Physical Exam  Constitutional: She appears well-developed and well-nourished. No distress.  HENT:  Right Ear: External ear normal.  Left Ear: External ear normal.  Nose: Nose normal.  Eyes: Conjunctivae and EOM are normal.  Neck: Neck supple. Carotid bruit is present.  Cardiovascular: Normal rate, regular rhythm and normal heart sounds.  Pulmonary/Chest: Effort normal and breath sounds normal. She has no wheezes.  Neurological: She is alert.  Skin: Skin is warm and dry. Capillary refill takes less than 2 seconds. She is not diaphoretic.  Psychiatric: She has a normal mood and affect.          Assessment & Plan:   Problem  List Items Addressed This Visit      Other   Hx of breast cancer    Next mammogram due in Jan 2020      Relevant Orders   MM Digital Screening   Tobacco abuse    Failed Chantax and previously tried patches w/o success. Will continue to address readiness.       Relevant Orders   US Carotid Duplex Bilateral    Other Visit Diagnoses    Bruit of right carotid artery    -  Primary   Relevant Orders   US Carotid Duplex Bilateral   Allergic reaction, subsequent encounter         Bruit noted today on exam and by prior provider. Will obtain records for Lipids. Encourage smoking cessation and will evaluate with Carotid doppler. Follow-up after test to discuss next steps  Allergic reaction - mostly resolved. Discussed Derm and Allergy referral and encouraging going as still with some hives on skin and  allergy testing may be helpful  Return for to discuss results.  Lesleigh Noe, MD

## 2018-03-09 NOTE — Assessment & Plan Note (Signed)
Next mammogram due in Jan 2020

## 2018-03-17 ENCOUNTER — Telehealth: Payer: Self-pay

## 2018-03-17 ENCOUNTER — Encounter: Payer: Self-pay | Admitting: Obstetrics & Gynecology

## 2018-03-17 ENCOUNTER — Ambulatory Visit: Payer: BLUE CROSS/BLUE SHIELD

## 2018-03-17 NOTE — Telephone Encounter (Signed)
Stephanie with [facility] [place] called to let Dr Einar Pheasant know that pt did not show up for Carotid US. Pt established care on 03/09/18.

## 2018-03-18 ENCOUNTER — Ambulatory Visit: Payer: BLUE CROSS/BLUE SHIELD

## 2018-03-18 ENCOUNTER — Other Ambulatory Visit: Payer: Self-pay | Admitting: Family Medicine

## 2018-03-18 ENCOUNTER — Telehealth: Payer: Self-pay | Admitting: Family Medicine

## 2018-03-18 ENCOUNTER — Ambulatory Visit (HOSPITAL_COMMUNITY): Payer: BLUE CROSS/BLUE SHIELD

## 2018-03-18 DIAGNOSIS — R0989 Other specified symptoms and signs involving the circulatory and respiratory systems: Secondary | ICD-10-CM

## 2018-03-18 NOTE — Progress Notes (Signed)
Request to change location for Carotid Duplex so attempted to change, though location not clear and placed as external

## 2018-03-18 NOTE — Telephone Encounter (Signed)
Thank you for the update!

## 2018-03-18 NOTE — Telephone Encounter (Signed)
Left message for patient to call back to follow up  

## 2018-03-18 NOTE — Telephone Encounter (Signed)
Pt returned call that she received.

## 2018-03-18 NOTE — Telephone Encounter (Signed)
Linda Tran/[facility] called office stating that the pt was scheduled at the hospital for her ultrasound. The hospital let the pt know she would have to pay full price for the exam. The pt has decided to do the ultrasound at [place] [facility]. She has went over pricing and details with the pt and have her scheduled. She is requesting Dr.Cody to send a new order.

## 2018-03-18 NOTE — Telephone Encounter (Signed)
Spoke with patient. Patient was told wrong date by the pre registration representative. I advised patient to call and reschedule and patient is been seen today-03/18/18- for her [place].

## 2018-03-18 NOTE — Telephone Encounter (Signed)
Will try to reach out to her to see if she plans to reschedule.

## 2018-03-18 NOTE — Telephone Encounter (Signed)
Left message with Bubba Hales that US carotid order is there and we changed the location to GI. Does that work or we need to change something with it.

## 2018-03-19 NOTE — Telephone Encounter (Signed)
Spoke with patient about everything and will keep checking make sure her appointment is made.

## 2018-03-19 NOTE — Telephone Encounter (Signed)
See other message

## 2018-03-19 NOTE — Progress Notes (Signed)
I left a message for Linda Tran yesterday evening to check on this to make sure of the location. Our system in epic only has [place] location and [facility]. I went ahead and got this order changed to [facility]. We do not have [address] address in epic we can select. I am waiting to hear back if this will work. Thank you.

## 2018-03-19 NOTE — Telephone Encounter (Signed)
Left message for patient to discuss everything

## 2018-03-19 NOTE — Progress Notes (Signed)
Spoke with Mardene Celeste at [facility] and states order is correct and they will call patient to schedule.

## 2018-03-26 NOTE — Telephone Encounter (Signed)
Patient is scheduled for 04/16/2018 per epic

## 2018-04-16 ENCOUNTER — Telehealth: Payer: Self-pay

## 2018-04-16 ENCOUNTER — Ambulatory Visit
Admission: RE | Admit: 2018-04-16 | Discharge: 2018-04-16 | Disposition: A | Discharge status: Home / Self Care | Payer: BLUE CROSS/BLUE SHIELD | Source: Ambulatory Visit | Attending: Family Medicine | Admitting: Family Medicine

## 2018-04-16 DIAGNOSIS — I6523 Occlusion and stenosis of bilateral carotid arteries: Secondary | ICD-10-CM | POA: Diagnosis not present

## 2018-04-16 DIAGNOSIS — Z720 Tobacco use: Secondary | ICD-10-CM

## 2018-04-16 DIAGNOSIS — R0989 Other specified symptoms and signs involving the circulatory and respiratory systems: Secondary | ICD-10-CM

## 2018-04-16 NOTE — Telephone Encounter (Signed)
Pt returned your call  Best number [phone]

## 2018-04-16 NOTE — Telephone Encounter (Signed)
Spoke with patient and appointment made

## 2018-04-16 NOTE — Telephone Encounter (Signed)
Left message for patient to call back. Needs a follow up appointment with Dr.Cody to discuss carotid ultrasound results and further plan of care.

## 2018-04-20 ENCOUNTER — Ambulatory Visit: Payer: BLUE CROSS/BLUE SHIELD | Admitting: Family Medicine

## 2018-04-20 ENCOUNTER — Encounter: Payer: Self-pay | Admitting: Family Medicine

## 2018-04-20 VITALS — BP 104/72 | HR 102 | Temp 98.1°F | Ht 62.0 in | Wt 133.5 lb

## 2018-04-20 DIAGNOSIS — Z1159 Encounter for screening for other viral diseases: Secondary | ICD-10-CM | POA: Diagnosis not present

## 2018-04-20 DIAGNOSIS — I708 Atherosclerosis of other arteries: Secondary | ICD-10-CM | POA: Insufficient documentation

## 2018-04-20 DIAGNOSIS — I7090 Unspecified atherosclerosis: Secondary | ICD-10-CM | POA: Insufficient documentation

## 2018-04-20 DIAGNOSIS — Z720 Tobacco use: Secondary | ICD-10-CM

## 2018-04-20 DIAGNOSIS — Z716 Tobacco abuse counseling: Secondary | ICD-10-CM

## 2018-04-20 MED ORDER — BUPROPION HCL ER (SR) 150 MG PO TB12: 150.0000 mg | ORAL_TABLET | Freq: Two times a day (BID) | ORAL | 1 refills | Status: DC

## 2018-04-20 MED ORDER — NICOTINE 14 MG/24HR TD PT24: 14.0000 mg | MEDICATED_PATCH | Freq: Every day | TRANSDERMAL | 1 refills | Status: AC

## 2018-04-20 MED ORDER — ASPIRIN EC 81 MG PO TBEC: 81.0000 mg | DELAYED_RELEASE_TABLET | Freq: Every day | ORAL | 3 refills | Status: DC

## 2018-04-20 MED ORDER — NICOTINE 7 MG/24HR TD PT24: 7.0000 mg | MEDICATED_PATCH | Freq: Every day | TRANSDERMAL | 1 refills | Status: AC

## 2018-04-20 MED ORDER — ATORVASTATIN CALCIUM 10 MG PO TABS: 10.0000 mg | ORAL_TABLET | Freq: Every day | ORAL | 3 refills | Status: DC

## 2018-04-20 MED ORDER — NICOTINE 21 MG/24HR TD PT24: 21.0000 mg | MEDICATED_PATCH | Freq: Every day | TRANSDERMAL | 0 refills | Status: AC

## 2018-04-20 NOTE — Assessment & Plan Note (Signed)
Discussed cessation and interested and willing to quit. No quit date chosen but patient will do this at home. Nicotine replacement + Wellbutrin prescribed. Return in 3 months for check-in or sooner if having side effects

## 2018-04-20 NOTE — Progress Notes (Signed)
Subjective:     Linda Tran is a 58 y.o. female presenting for Go over results (of carotid ultrasound.)     HPI   #Tobacco use - tried the patches - difficulty getting rest at night - chantix - dreams at night - Is interested in quitting but has failed in the past - Discussed wellbutrin and she is willing to try  #Carotid US with Bilateral atherosclerotic plaque - discussed that there is no need for surgical intervention - would recommend statin and antiplatelet therapy - also advised regular exercise - she walks around at her job regularly - and mediterranean diet   Review of Systems   Social History   Tobacco Use  Smoking Status Current Every Day Smoker  . Packs/day: 1.00  . Years: 35.00  . Pack years: 35.00  . Types: Cigarettes  Smokeless Tobacco Never Used        Objective:    BP Readings from Last 3 Encounters:  04/20/18 104/72  03/09/18 104/66  02/19/18 124/75   Wt Readings from Last 3 Encounters:  04/20/18 133 lb 8 oz (60.6 kg)  03/09/18 129 lb 12 oz (58.9 kg)  02/19/18 131 lb (59.4 kg)    BP 104/72   Pulse (!) 102   Temp 98.1 F (36.7 C)   Ht 5\' 2"  (1.575 m)   Wt 133 lb 8 oz (60.6 kg)   LMP 11/01/2015   SpO2 95%   BMI 24.42 kg/m    Physical Exam Constitutional:      General: She is not in acute distress.    Appearance: She is well-developed. She is not diaphoretic.  HENT:     Right Ear: External ear normal.     Left Ear: External ear normal.     Nose: Nose normal.  Eyes:     Conjunctiva/sclera: Conjunctivae normal.  Neck:     Musculoskeletal: Neck supple.  Cardiovascular:     Rate and Rhythm: Regular rhythm. Tachycardia present.     Heart sounds: No murmur.  Pulmonary:     Effort: Pulmonary effort is normal.     Breath sounds: Normal breath sounds. No wheezing or rales.  Skin:    General: Skin is warm and dry.     Capillary Refill: Capillary refill takes less than 2 seconds.  Neurological:     Mental Status: She is  alert. Mental status is at baseline.  Psychiatric:        Mood and Affect: Mood normal.        Behavior: Behavior normal.           Assessment & Plan:   Problem List Items Addressed This Visit      Cardiovascular and Mediastinum   Atherosclerosis of artery - Primary    Bilateral carotid arteries. Starting statin and ASA 81 mg today. Will obtain lipids and HgbA1c to assess other risk factors. Also smoking cessation as above and diet changes/exercise per AVS      Relevant Medications   atorvastatin (LIPITOR) 10 MG tablet   aspirin EC 81 MG tablet   Other Relevant Orders   Lipid panel   Hemoglobin A1c     Other   Tobacco abuse    Discussed cessation and interested and willing to quit. No quit date chosen but patient will do this at home. Nicotine replacement + Wellbutrin prescribed. Return in 3 months for check-in or sooner if having side effects      Relevant Medications   nicotine (NICODERM CQ -  DOSED IN MG/24 HOURS) 21 mg/24hr patch   nicotine (NICODERM CQ - DOSED IN MG/24 HOURS) 14 mg/24hr patch   nicotine (NICODERM CQ - DOSED IN MG/24 HR) 7 mg/24hr patch   buPROPion (WELLBUTRIN SR) 150 MG 12 hr tablet    Other Visit Diagnoses    Encounter for tobacco use cessation counseling       Relevant Medications   nicotine (NICODERM CQ - DOSED IN MG/24 HOURS) 21 mg/24hr patch   nicotine (NICODERM CQ - DOSED IN MG/24 HOURS) 14 mg/24hr patch   nicotine (NICODERM CQ - DOSED IN MG/24 HR) 7 mg/24hr patch   buPROPion (WELLBUTRIN SR) 150 MG 12 hr tablet   Need for hepatitis C screening test       Relevant Orders   Hepatitis C antibody     Labs with wellness center ordered. Will review results.   Return in about 3 months (around 07/20/2018).  Lesleigh Noe, MD

## 2018-04-20 NOTE — Assessment & Plan Note (Signed)
Bilateral carotid arteries. Starting statin and ASA 81 mg today. Will obtain lipids and HgbA1c to assess other risk factors. Also smoking cessation as above and diet changes/exercise per AVS

## 2018-04-20 NOTE — Patient Instructions (Signed)
#Quitting Tobacco 1) pick a quit date 2) Start the Wellbutrin 1-2 weeks before your quit date 3) Start Patches 21 mg x 2-3 weeks, then 14 mg x 2 weeks, then 7 mg x 2 weeks 4) Continue wellbutrin for at least 3 months after you stop smoking  #Plaques on your arteries 1) Quit smoking 2) Start Statin medication 3) Start Aspirin 81 mg 4) Get regular physical activity 5) Mediterranean diet   Mediterranean Diet A Mediterranean diet refers to food and lifestyle choices that are based on the traditions of countries located on the [employer]. This way of eating has been shown to help prevent certain conditions and improve outcomes for people who have chronic diseases, like kidney disease and heart disease. What are tips for following this plan? Lifestyle  Cook and eat meals together with your family, when possible.  Drink enough fluid to keep your urine clear or pale yellow.  Be physically active every day. This includes: ? Aerobic exercise like running or swimming. ? Leisure activities like gardening, walking, or housework.  Get 7-8 hours of sleep each night.  If recommended by your health care provider, drink red wine in moderation. This means 1 glass a day for nonpregnant women and 2 glasses a day for men. A glass of wine equals 5 oz (150 mL). Reading food labels   Check the serving size of packaged foods. For foods such as rice and pasta, the serving size refers to the amount of cooked product, not dry.  Check the total fat in packaged foods. Avoid foods that have saturated fat or trans fats.  Check the ingredients list for added sugars, such as corn syrup. Shopping  At the grocery store, buy most of your food from the areas near the walls of the store. This includes: ? Fresh fruits and vegetables (produce). ? Grains, beans, nuts, and seeds. Some of these may be available in unpackaged forms or large amounts (in bulk). ? Fresh seafood. ? Poultry and eggs. ? Low-fat  dairy products.  Buy whole ingredients instead of prepackaged foods.  Buy fresh fruits and vegetables in-season from local farmers markets.  Buy frozen fruits and vegetables in resealable bags.  If you do not have access to quality fresh seafood, buy precooked frozen shrimp or canned fish, such as tuna, salmon, or sardines.  Buy small amounts of raw or cooked vegetables, salads, or olives from the deli or salad bar at your store.  Stock your pantry so you always have certain foods on hand, such as olive oil, canned tuna, canned tomatoes, rice, pasta, and beans. Cooking  Cook foods with extra-virgin olive oil instead of using butter or other vegetable oils.  Have meat as a side dish, and have vegetables or grains as your main dish. This means having meat in small portions or adding small amounts of meat to foods like pasta or stew.  Use beans or vegetables instead of meat in common dishes like chili or lasagna.  Experiment with different cooking methods. Try roasting or broiling vegetables instead of steaming or sauteing them.  Add frozen vegetables to soups, stews, pasta, or rice.  Add nuts or seeds for added healthy fat at each meal. You can add these to yogurt, salads, or vegetable dishes.  Marinate fish or vegetables using olive oil, lemon juice, garlic, and fresh herbs. Meal planning   Plan to eat 1 vegetarian meal one day each week. Try to work up to 2 vegetarian meals, if possible.  Eat seafood  2 or more times a week.  Have healthy snacks readily available, such as: ? Vegetable sticks with hummus. ? [place] yogurt. ? Fruit and nut trail mix.  Eat balanced meals throughout the week. This includes: ? Fruit: 2-3 servings a day ? Vegetables: 4-5 servings a day ? Low-fat dairy: 2 servings a day ? Fish, poultry, or lean meat: 1 serving a day ? Beans and legumes: 2 or more servings a week ? Nuts and seeds: 1-2 servings a day ? Whole grains: 6-8 servings a  day ? Extra-virgin olive oil: 3-4 servings a day  Limit red meat and sweets to only a few servings a month What are my food choices?  Mediterranean diet ? Recommended ? Grains: Whole-grain pasta. Brown rice. Bulgar wheat. Polenta. Couscous. Whole-wheat bread. [place]. ? Vegetables: Artichokes. Beets. Broccoli. Cabbage. Carrots. Eggplant. Green beans. Chard. Kale. Spinach. Onions. Leeks. Peas. Squash. Tomatoes. Peppers. Radishes. ? Fruits: Apples. Apricots. Avocado. Berries. Bananas. Cherries. Dates. Figs. Grapes. Lemons. Melon. Oranges. Peaches. Plums. Pomegranate. ? Meats and other protein foods: Beans. Almonds. Sunflower seeds. Pine nuts. Peanuts. [place]. Salmon. Scallops. Shrimp. [place]. Tilapia. Clams. Oysters. Eggs. ? Dairy: Low-fat milk. Cheese. Greek yogurt. ? Beverages: Water. Red wine. Herbal tea. ? Fats and oils: Extra virgin olive oil. Avocado oil. Grape seed oil. ? Sweets and desserts: [place] yogurt with honey. Baked apples. Poached pears. Trail mix. ? Seasoning and other foods: Basil. Cilantro. Coriander. Cumin. Mint. Parsley. Sage. Rosemary. Tarragon. Garlic. Oregano. Thyme. Pepper. Balsalmic vinegar. Tahini. Hummus. Tomato sauce. Olives. Mushrooms. ? Limit these ? Grains: Prepackaged pasta or rice dishes. Prepackaged cereal with added sugar. ? Vegetables: Deep fried potatoes (french fries). ? Fruits: Fruit canned in syrup. ? Meats and other protein foods: Beef. Pork. Lamb. Poultry with skin. Hot dogs. Berniece Salines. ? Dairy: Ice cream. Sour cream. Whole milk. ? Beverages: Juice. Sugar-sweetened soft drinks. Beer. Liquor and spirits. ? Fats and oils: Butter. Canola oil. Vegetable oil. Beef fat (tallow). Lard. ? Sweets and desserts: Cookies. Cakes. Pies. Candy. ? Seasoning and other foods: Mayonnaise. Premade sauces and marinades. ? The items listed may not be a complete list. Talk with your dietitian about what dietary choices are right for you. Summary  The Mediterranean diet  includes both food and lifestyle choices.  Eat a variety of fresh fruits and vegetables, beans, nuts, seeds, and whole grains.  Limit the amount of red meat and sweets that you eat.  Talk with your health care provider about whether it is safe for you to drink red wine in moderation. This means 1 glass a day for nonpregnant women and 2 glasses a day for men. A glass of wine equals 5 oz (150 mL). This information is not intended to replace advice given to you by your health care provider. Make sure you discuss any questions you have with your health care provider. Document Released: 11/22/2015 Document Revised: 12/25/2015 Document Reviewed: 11/22/2015 Elsevier Interactive Patient Education  2019 [employer].

## 2018-04-28 ENCOUNTER — Other Ambulatory Visit: Payer: Self-pay

## 2018-04-29 ENCOUNTER — Other Ambulatory Visit: Payer: Self-pay

## 2018-04-29 DIAGNOSIS — I708 Atherosclerosis of other arteries: Secondary | ICD-10-CM

## 2018-04-29 DIAGNOSIS — Z1231 Encounter for screening mammogram for malignant neoplasm of breast: Secondary | ICD-10-CM

## 2018-04-29 DIAGNOSIS — I7090 Unspecified atherosclerosis: Secondary | ICD-10-CM

## 2018-04-29 DIAGNOSIS — Z853 Personal history of malignant neoplasm of breast: Secondary | ICD-10-CM

## 2018-04-29 DIAGNOSIS — Z1159 Encounter for screening for other viral diseases: Secondary | ICD-10-CM

## 2018-04-30 LAB — LIPID PANEL
Chol/HDL Ratio: 4.4 ratio (ref 0.0–4.4)
Cholesterol, Total: 240 mg/dL — ABNORMAL HIGH (ref 100–199)
HDL: 55 mg/dL (ref >39)
LDL Calculated: 165 mg/dL — ABNORMAL HIGH (ref 0–99)
Triglycerides: 102 mg/dL (ref 0–149)
VLDL Cholesterol Cal: 20 mg/dL (ref 5–40)

## 2018-04-30 LAB — HEMOGLOBIN A1C
Est. average glucose Bld gHb Est-mCnc: 123 mg/dL
Hgb A1c MFr Bld: 5.9 % — ABNORMAL HIGH (ref 4.8–5.6)

## 2018-04-30 LAB — HEPATITIS C ANTIBODY: Hep C Virus Ab: <0.1 s/co ratio (ref 0.0–0.9)

## 2018-05-03 ENCOUNTER — Telehealth: Payer: Self-pay

## 2018-05-03 NOTE — Addendum Note (Signed)
Addended by: Kris Mouton on: 05/03/2018 04:28 PM   Modules accepted: Orders

## 2018-05-03 NOTE — Telephone Encounter (Signed)
Patient advised of results.

## 2018-05-11 NOTE — Progress Notes (Signed)
Patient is followed by PCP now

## 2018-08-17 ENCOUNTER — Other Ambulatory Visit: Payer: Self-pay | Admitting: Nurse Practitioner

## 2018-08-17 ENCOUNTER — Telehealth: Payer: Self-pay | Admitting: *Deleted

## 2018-08-17 DIAGNOSIS — J301 Allergic rhinitis due to pollen: Secondary | ICD-10-CM

## 2018-08-17 DIAGNOSIS — Z760 Encounter for issue of repeat prescription: Secondary | ICD-10-CM

## 2018-08-17 MED ORDER — FLUTICASONE PROPIONATE 50 MCG/ACT NA SUSP: 2.0000 | Freq: Every day | NASAL | 0 refills | Status: DC

## 2018-08-17 NOTE — Progress Notes (Signed)
Requesting RF on flonase. Sent to pharmacy of choice.

## 2018-08-17 NOTE — Telephone Encounter (Signed)
See phone note

## 2018-10-04 ENCOUNTER — Ambulatory Visit
Admission: RE | Admit: 2018-10-04 | Discharge: 2018-10-04 | Disposition: A | Discharge status: Home / Self Care | Payer: BC Managed Care – PPO | Source: Ambulatory Visit | Attending: Family Medicine | Admitting: Family Medicine

## 2018-10-04 ENCOUNTER — Other Ambulatory Visit: Payer: Self-pay

## 2018-10-04 DIAGNOSIS — Z1231 Encounter for screening mammogram for malignant neoplasm of breast: Secondary | ICD-10-CM

## 2018-10-04 DIAGNOSIS — Z853 Personal history of malignant neoplasm of breast: Secondary | ICD-10-CM | POA: Diagnosis not present

## 2018-10-06 ENCOUNTER — Telehealth: Payer: Self-pay

## 2018-10-06 NOTE — Telephone Encounter (Signed)
Left message for patient to call back in regards to mammogram results

## 2018-10-07 ENCOUNTER — Telehealth: Payer: Self-pay | Admitting: *Deleted

## 2018-10-07 ENCOUNTER — Other Ambulatory Visit: Payer: Self-pay

## 2018-10-07 ENCOUNTER — Encounter: Payer: Self-pay | Admitting: Family Medicine

## 2018-10-07 DIAGNOSIS — Z20822 Contact with and (suspected) exposure to covid-19: Secondary | ICD-10-CM

## 2018-10-07 DIAGNOSIS — R6889 Other general symptoms and signs: Secondary | ICD-10-CM | POA: Diagnosis not present

## 2018-10-07 NOTE — Telephone Encounter (Signed)
Left patient a voicemail to call back to schedule COVID 19 test.  COVID 19 test ordered.

## 2018-10-07 NOTE — Telephone Encounter (Signed)
Patient advised of mammogram results.  °

## 2018-10-07 NOTE — Telephone Encounter (Signed)
Dr. Einar Pheasant please review and then I will call her back with advise and letting her know mammogram results. Thank you

## 2018-10-07 NOTE — Telephone Encounter (Signed)
Pt called back to schedule testing; pt accepted appointment at [facility] site 10/07/2018 at 1300; pt given address, location, and instructions that she and all occupants of her vehicle should wear masks; the pt verbalized understanding; orders placed per protocol.

## 2018-10-07 NOTE — Telephone Encounter (Signed)
Pt returned your call concerning mammo results. 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 She wants to know where she can go for testing and/or what she she needs to do at this point.  Please call cell [phone]

## 2018-10-07 NOTE — Telephone Encounter (Signed)
Pt calling to speak with Anastasiya; pt has already been scheduled for Covid; Azalee Course will speak with pt.

## 2018-10-07 NOTE — Addendum Note (Signed)
Addended by: Addison Naegeli on: 10/07/2018 11:05 AM   Modules accepted: Orders

## 2018-10-07 NOTE — Telephone Encounter (Signed)
-----   Message from Lesleigh Noe, MD sent at 10/07/2018 10:37 AM EDT ----- Regarding: exposure to coronavirus Please contact to test.   Exposure to covid-19 positive person And with chronic cough, otherwise no new symptoms

## 2018-10-07 NOTE — Telephone Encounter (Signed)
Messaged [facility] to contact patient regarding testing, given exposure to someone with coronavirus.

## 2018-10-07 NOTE — Telephone Encounter (Signed)
Letter provided. Given circumstances would advise having a friend pick up the letter or we can mail it to patient.

## 2018-10-07 NOTE — Telephone Encounter (Signed)
Pt left v/m; pt had testing and needs letter faxed to attention Mortimer Fries (fax # [phone]); pt has to be out of work for 5 working days and with holiday pt thinks can return to work on 10/18/18. Pt request cb.

## 2018-10-07 NOTE — Telephone Encounter (Signed)
Spoke with patient. Mortimer Fries is her supervisor. When she went to get tested today she was told to be out of work for 5 business days not including weekends in case it takes that long to get test back. She needs letter to say that she is been tested for COVID and will need to be out until July 6th (her work is closed on 10/15/2018 for holiday) but she would return sooner to work if test comes back in and it is negative before then. Sending to Dr. Einar Pheasant for review.

## 2018-10-08 NOTE — Telephone Encounter (Signed)
Noted and letter placed up front

## 2018-10-08 NOTE — Telephone Encounter (Signed)
Patient stated her son Linda Tran will be coming in to pick up patient's letter

## 2018-10-08 NOTE — Telephone Encounter (Signed)
Patient advised. Mailing letter to the patient. Patient will call me back if her son is able to get off on time and come to pick it up.

## 2018-10-11 LAB — NOVEL CORONAVIRUS, NAA: SARS-CoV-2, NAA: NOT DETECTED

## 2018-10-12 ENCOUNTER — Telehealth: Payer: Self-pay | Admitting: *Deleted

## 2018-10-12 NOTE — Telephone Encounter (Signed)
Pt called for clarification. Linda Tran was tested for Covid-19 on 10/07/2018 (for exposure) and was negative. She remains asymptomatic. Her PCP advised her she may return to work. A co-worker advised her she was told be her doctor to stay out of work for 14 days. Confirmed to Linda Tran that her PCP Dr.Cody is correct that she is safe to return to work as long as she does not have any symptoms (symptoms reviewed). Pt verbalizes understanding and has no further needs at this time.

## 2018-12-02 ENCOUNTER — Telehealth: Payer: Self-pay

## 2018-12-02 NOTE — Telephone Encounter (Signed)
Patient contacted our office concerned about a bite on her elbow. We requested her to send in pictures via my chart but refused.  She has a low grade temp 99.5, headache, upset stomach and toothache.  Instructed patient to contact her primary doctor for a Covid Test. Also instructed patient to take Tylenol for fever and discomfort if needed.  She can also take Zyrtec if itching persists from the bite on her elbow.

## 2018-12-03 ENCOUNTER — Encounter: Payer: Self-pay | Admitting: Family Medicine

## 2018-12-03 ENCOUNTER — Ambulatory Visit (INDEPENDENT_AMBULATORY_CARE_PROVIDER_SITE_OTHER): Payer: BC Managed Care – PPO | Admitting: Family Medicine

## 2018-12-03 ENCOUNTER — Other Ambulatory Visit: Payer: Self-pay

## 2018-12-03 VITALS — Ht 62.0 in | Wt 120.0 lb

## 2018-12-03 DIAGNOSIS — R7303 Prediabetes: Secondary | ICD-10-CM | POA: Diagnosis not present

## 2018-12-03 DIAGNOSIS — W57XXXA Bitten or stung by nonvenomous insect and other nonvenomous arthropods, initial encounter: Secondary | ICD-10-CM

## 2018-12-03 DIAGNOSIS — R197 Diarrhea, unspecified: Secondary | ICD-10-CM | POA: Diagnosis not present

## 2018-12-03 DIAGNOSIS — R52 Pain, unspecified: Secondary | ICD-10-CM

## 2018-12-03 DIAGNOSIS — R5383 Other fatigue: Secondary | ICD-10-CM

## 2018-12-03 DIAGNOSIS — S70362A Insect bite (nonvenomous), left thigh, initial encounter: Secondary | ICD-10-CM

## 2018-12-03 DIAGNOSIS — R6889 Other general symptoms and signs: Secondary | ICD-10-CM | POA: Diagnosis not present

## 2018-12-03 DIAGNOSIS — Z20822 Contact with and (suspected) exposure to covid-19: Secondary | ICD-10-CM

## 2018-12-03 NOTE — Progress Notes (Signed)
Virtual Visit via Video Note  I connected with Linda Tran on 12/03/18 at 12:26 PM EDT by a video enabled telemedicine application and verified that I am speaking with the correct person using two identifiers.  Location: Patient: In her home Provider: [place]   I discussed the limitations of evaluation and management by telemedicine and the availability of in person appointments. The patient expressed understanding and agreed to proceed.  History of Present Illness: Chief Complaint  Patient presents with  . Tick Bite    Pt c/o a low grade temp 99.5, headache and upset stomach. Pt states that her baseline temp is 97.5. Pt cannot return to work until she is seen. Pt state that she has been bitten by 3 ticks in the last month. Pt states that one left behind a wart-like blister in the area she was bitten. Pt states that she has been feeling fatigued recently, lightheaded and some nausea. Pt reports vomiting the night before last and then having diarrhea. Pt states she was stung by something on her elbow on Monday.   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  She reports 1 day of temperature to 99.5, chills, body aches and fatigue.  She does not have a cough but thinks she may have some shortness of breath on exertion but it is hard to tell because she has symptoms while wearing a cloth face covering which she says makes her feel claustrophobic.  She has had some intermittent lightheadedness.  She reports having multiple tick bites approximately 4 to 6 weeks ago.  They were very small ticks and did not appear engorged when she remove them.  She denies rash or unusual headache.  She has an area on her left thigh where she removed the tick that is slightly raised.  It is not red and has not had any drainage.  She has never had Lyme disease in the past. She requests follow-up blood work for her prediabetes.   Past Medical History:  Diagnosis Date  .  Allergy   . Anemia   . Breast cancer ([place]) 2013   left breast, DCIS, radiation  . Hx of migraine headaches   . Personal history of radiation therapy 2013   F/U from lumpectomy for DCIS   Past Surgical History:  Procedure Laterality Date  . BREAST BIOPSY Left 01/12/12   positive,DCIS  . BREAST BIOPSY Right 2012   negative  . BREAST BIOPSY Right 2017   2 MM INTRADUCTAL PAPILLOMA.   Marland Kitchen BREAST CYST ASPIRATION Left negative  . BREAST EXCISIONAL BIOPSY Left 01/19/12   positve, DCIS  . BREAST LUMPECTOMY Left 2013   DCIS, lumpectomy F/U radiation   . BREAST SURGERY    . CESAREAN SECTION    . COLONOSCOPY    . POLYPECTOMY     hyperplastic polyps  . TUBAL LIGATION     Family History  Problem Relation Age of Onset  . Arthritis Mother   . Heart disease Father 32       AMI  . Arthritis Sister   . Kidney disease Sister   . Learning disabilities Sister   . Hypertension Sister   . Hyperlipidemia Sister   . Colon cancer Neg Hx   . Colon polyps Neg Hx   . Stomach cancer Neg Hx   . Rectal cancer Neg Hx   . Esophageal cancer Neg Hx    Social History   Tobacco Use  . Smoking status: Current Every  Day Smoker    Packs/day: 1.00    Years: 35.00    Pack years: 35.00    Types: Cigarettes  . Smokeless tobacco: Never Used  Substance Use Topics  . Alcohol use: Yes    Comment: social, occasional  . Drug use: No      Observations/Objective: Patient is alert and answers questions appropriately.  Conjunctiva clear.  She is normally conversive without shortness of breath, audible wheeze or witnessed cough.  She is able to show me a raised area on her left medial thigh.  It is not red.  It appears to be approximately 2 mm.  Her mood and affect are appropriate.  Ht 5\' 2"  (1.575 m)   Wt 120 lb (54.4 kg)   LMP 11/01/2015   BMI 21.95 kg/m  BP Readings from Last 3 Encounters:  04/20/18 104/72  03/09/18 104/66  02/19/18 124/75     Assessment and Plan: 1. Diarrhea, unspecified  type -Cannot rule out COVID-19 and I have recommended that she get testing today.  She is agreeable to this and will go to the drive-through testing site in [place]. - Novel Coronavirus, NAA ([facility]) - CBC with Differential/Platelet; Future -Follow-up precautions reviewed with patient  2. Body aches -Given multiple recent tick bites will check her for Lyme disease-she will schedule lab only visit once her COVID-19 test is returned. - Novel Coronavirus, NAA ([facility]) - B. burgdorfi antibodies by WB  3. Tick bite of left thigh, initial encounter - B. burgdorfi antibodies by WB  4. Prediabetes - Hemoglobin A1c; Future  5. Other fatigue - CBC with Differential/Platelet; Future - B. burgdorfi antibodies by WB   Clarene Reamer, FNP-BC  [facility]  12/03/2018 1:00 PM   Follow Up Instructions:    I discussed the assessment and treatment plan with the patient. The patient was provided an opportunity to ask questions and all were answered. The patient agreed with the plan and demonstrated an understanding of the instructions.   The patient was advised to call back or seek an in-person evaluation if the symptoms worsen or if the condition fails to improve as anticipated.   Elby Beck, FNP

## 2018-12-03 NOTE — Addendum Note (Signed)
Addended by: Ellamae Sia on: 12/03/2018 03:20 PM   Modules accepted: Orders

## 2018-12-04 LAB — NOVEL CORONAVIRUS, NAA: SARS-CoV-2, NAA: NOT DETECTED

## 2018-12-05 ENCOUNTER — Encounter: Payer: Self-pay | Admitting: Family Medicine

## 2018-12-06 ENCOUNTER — Telehealth: Payer: Self-pay | Admitting: *Deleted

## 2018-12-06 NOTE — Telephone Encounter (Signed)
Patient called stating that she has been discussing her health with Tor Netters NP since Dr. Einar Pheasant is out of the office. Patient stated that she is needing lab work to be checked for lyme's disease. Patient stated that she would prefer to have her lab work done on the campus where she works at [employer] which [facility]. Patient would like an order to have her lab work done there since she was told that the lab work needs to be fasting. Patient requested a call back to let her know that this can be done and if an order needs to be sent to them.

## 2018-12-06 NOTE — Telephone Encounter (Signed)
Please call patient tell her I am happy for her to have her blood work drawn at [facility] [facility] but I do not know how to put in the orders for them to do with there.  If she can find this information for me and let me know I am happy to help her with this.

## 2018-12-06 NOTE — Telephone Encounter (Signed)
Pt states that she has never had to do anything special as far as orders go - pt states that in the past the orders have just been put in normal. They use a [facility]. Pt has done this with previous labs that Dr Einar Pheasant has ordered

## 2018-12-06 NOTE — Telephone Encounter (Signed)
You can let her know that the lab orders are already in then and she can have them drawn at her convenience.

## 2018-12-07 NOTE — Telephone Encounter (Signed)
Pt notified - made aware via voicemail that orders have been placed. Advised to call back if anything further needed.   Nothing further needed.

## 2018-12-08 ENCOUNTER — Other Ambulatory Visit: Payer: Self-pay | Admitting: *Deleted

## 2018-12-08 DIAGNOSIS — R197 Diarrhea, unspecified: Secondary | ICD-10-CM

## 2018-12-08 DIAGNOSIS — S70362A Insect bite (nonvenomous), left thigh, initial encounter: Secondary | ICD-10-CM

## 2018-12-08 DIAGNOSIS — R52 Pain, unspecified: Secondary | ICD-10-CM

## 2018-12-09 ENCOUNTER — Other Ambulatory Visit: Payer: BC Managed Care – PPO

## 2018-12-09 ENCOUNTER — Other Ambulatory Visit: Payer: Self-pay

## 2018-12-09 DIAGNOSIS — S70362A Insect bite (nonvenomous), left thigh, initial encounter: Secondary | ICD-10-CM

## 2018-12-09 DIAGNOSIS — R52 Pain, unspecified: Secondary | ICD-10-CM

## 2018-12-09 DIAGNOSIS — R5383 Other fatigue: Secondary | ICD-10-CM

## 2018-12-09 DIAGNOSIS — W57XXXA Bitten or stung by nonvenomous insect and other nonvenomous arthropods, initial encounter: Secondary | ICD-10-CM

## 2018-12-09 DIAGNOSIS — R197 Diarrhea, unspecified: Secondary | ICD-10-CM

## 2018-12-09 DIAGNOSIS — R7303 Prediabetes: Secondary | ICD-10-CM

## 2018-12-10 LAB — CBC WITH DIFFERENTIAL/PLATELET
Basophils Absolute: 0.1 10*3/uL (ref 0.0–0.2)
Basos: 1 %
EOS (ABSOLUTE): 0.2 10*3/uL (ref 0.0–0.4)
Eos: 4 %
Hematocrit: 35 % (ref 34.0–46.6)
Hemoglobin: 11 g/dL — ABNORMAL LOW (ref 11.1–15.9)
Immature Grans (Abs): 0 10*3/uL (ref 0.0–0.1)
Immature Granulocytes: 0 %
Lymphocytes Absolute: 1.8 10*3/uL (ref 0.7–3.1)
Lymphs: 37 %
MCH: 23.6 pg — ABNORMAL LOW (ref 26.6–33.0)
MCHC: 31.4 g/dL — ABNORMAL LOW (ref 31.5–35.7)
MCV: 75 fL — ABNORMAL LOW (ref 79–97)
Monocytes Absolute: 0.3 10*3/uL (ref 0.1–0.9)
Monocytes: 7 %
Neutrophils Absolute: 2.5 10*3/uL (ref 1.4–7.0)
Neutrophils: 51 %
Platelets: 346 10*3/uL (ref 150–450)
RBC: 4.66 x10E6/uL (ref 3.77–5.28)
RDW: 17.8 % — ABNORMAL HIGH (ref 11.7–15.4)
WBC: 4.8 10*3/uL (ref 3.4–10.8)

## 2018-12-10 LAB — HGB A1C W/O EAG: Hgb A1c MFr Bld: 5.5 % (ref 4.8–5.6)

## 2018-12-10 LAB — B. BURGDORFI ANTIBODIES: Lyme IgG/IgM Ab: <0.91 {ISR} (ref 0.00–0.90)

## 2018-12-13 DIAGNOSIS — M5413 Radiculopathy, cervicothoracic region: Secondary | ICD-10-CM | POA: Diagnosis not present

## 2018-12-13 DIAGNOSIS — M9901 Segmental and somatic dysfunction of cervical region: Secondary | ICD-10-CM | POA: Diagnosis not present

## 2018-12-13 DIAGNOSIS — M542 Cervicalgia: Secondary | ICD-10-CM | POA: Diagnosis not present

## 2018-12-13 DIAGNOSIS — G44209 Tension-type headache, unspecified, not intractable: Secondary | ICD-10-CM | POA: Diagnosis not present

## 2018-12-14 DIAGNOSIS — G44209 Tension-type headache, unspecified, not intractable: Secondary | ICD-10-CM | POA: Diagnosis not present

## 2018-12-14 DIAGNOSIS — M9901 Segmental and somatic dysfunction of cervical region: Secondary | ICD-10-CM | POA: Diagnosis not present

## 2018-12-14 DIAGNOSIS — M5413 Radiculopathy, cervicothoracic region: Secondary | ICD-10-CM | POA: Diagnosis not present

## 2018-12-14 DIAGNOSIS — M542 Cervicalgia: Secondary | ICD-10-CM | POA: Diagnosis not present

## 2018-12-15 ENCOUNTER — Telehealth: Payer: Self-pay | Admitting: *Deleted

## 2018-12-15 NOTE — Telephone Encounter (Signed)
Patient left a voicemail stating that she had lab work done on 12/09/18 and would like the results. (Dr. Verda Cumins patient)

## 2018-12-15 NOTE — Telephone Encounter (Signed)
Looks like mild anemia with Hb of 11.0  Lab Results  Component Value Date   HGBA1C 5.5 12/09/2018   Lyme test negative   Not sure if Jackelyn Poling (who ordered) has seen them yet  Will route to her as well

## 2018-12-15 NOTE — Telephone Encounter (Signed)
Result note sent to CMA to call patient.

## 2018-12-15 NOTE — Telephone Encounter (Signed)
LM for patient to call back regarding results. See result note.  Will close this encounter

## 2018-12-16 ENCOUNTER — Telehealth: Payer: Self-pay

## 2018-12-16 NOTE — Telephone Encounter (Signed)
[facility] Night - Client Nonclinical Telephone Record AccessNurse Client [facility] Night - Client Client Site [place] - Night Contact Type Call Who Is Calling Patient / Member / Family / Caregiver Caller Name Tailee Willcutt Caller Phone Number [phone] Call Type Message Only Information Provided Reason for Call Returning a Call from the Office Initial [place] states she is returning a call from [place] at the office. Additional Comment Call Closed By: Fanny Bien Transaction Date/Time: 12/15/2018 5:05:43 PM (ET)

## 2018-12-16 NOTE — Telephone Encounter (Signed)
error 

## 2018-12-16 NOTE — Telephone Encounter (Signed)
Best number [phone] before [phone] after 3  Pt returned your call  Pt has appointment @ 3 and would like a call in am if poss

## 2018-12-16 NOTE — Telephone Encounter (Signed)
See result note.  

## 2018-12-17 NOTE — Telephone Encounter (Signed)
LM for patient to return call - see result note

## 2019-04-20 DIAGNOSIS — Z6824 Body mass index (BMI) 24.0-24.9, adult: Secondary | ICD-10-CM | POA: Diagnosis not present

## 2019-04-20 DIAGNOSIS — Z01419 Encounter for gynecological examination (general) (routine) without abnormal findings: Secondary | ICD-10-CM | POA: Diagnosis not present

## 2019-04-28 DIAGNOSIS — D1801 Hemangioma of skin and subcutaneous tissue: Secondary | ICD-10-CM | POA: Diagnosis not present

## 2019-04-28 DIAGNOSIS — D225 Melanocytic nevi of trunk: Secondary | ICD-10-CM | POA: Diagnosis not present

## 2019-04-28 DIAGNOSIS — L821 Other seborrheic keratosis: Secondary | ICD-10-CM | POA: Diagnosis not present

## 2019-04-28 DIAGNOSIS — L814 Other melanin hyperpigmentation: Secondary | ICD-10-CM | POA: Diagnosis not present

## 2019-06-09 ENCOUNTER — Encounter: Payer: Self-pay | Admitting: Family Medicine

## 2019-06-09 ENCOUNTER — Other Ambulatory Visit: Payer: Self-pay

## 2019-06-09 ENCOUNTER — Ambulatory Visit (INDEPENDENT_AMBULATORY_CARE_PROVIDER_SITE_OTHER): Payer: BC Managed Care – PPO | Admitting: Family Medicine

## 2019-06-09 VITALS — BP 122/70 | HR 98 | Temp 98.1°F | Ht 61.5 in | Wt 133.0 lb

## 2019-06-09 DIAGNOSIS — G8929 Other chronic pain: Secondary | ICD-10-CM

## 2019-06-09 DIAGNOSIS — I708 Atherosclerosis of other arteries: Secondary | ICD-10-CM | POA: Diagnosis not present

## 2019-06-09 DIAGNOSIS — M545 Low back pain: Secondary | ICD-10-CM | POA: Diagnosis not present

## 2019-06-09 DIAGNOSIS — J301 Allergic rhinitis due to pollen: Secondary | ICD-10-CM

## 2019-06-09 DIAGNOSIS — Z760 Encounter for issue of repeat prescription: Secondary | ICD-10-CM | POA: Diagnosis not present

## 2019-06-09 DIAGNOSIS — I7090 Unspecified atherosclerosis: Secondary | ICD-10-CM

## 2019-06-09 DIAGNOSIS — M419 Scoliosis, unspecified: Secondary | ICD-10-CM

## 2019-06-09 MED ORDER — FLUTICASONE PROPIONATE 50 MCG/ACT NA SUSP: 2.0000 | Freq: Every day | NASAL | 0 refills | Status: DC

## 2019-06-09 MED ORDER — METHOCARBAMOL 750 MG PO TABS: 750.0000 mg | ORAL_TABLET | Freq: Three times a day (TID) | ORAL | 3 refills | Status: DC

## 2019-06-09 NOTE — Patient Instructions (Signed)
#Back pain - physical therapy referral  - the one in [place] does not take insurance  Cholesterol labs today - we can continue to discuss statin    Preventive Care 55-58 Years Old, Female Preventive care refers to visits with your health care provider and lifestyle choices that can promote health and wellness. This includes:  A yearly physical exam. This may also be called an annual well check.  Regular dental visits and eye exams.  Immunizations.  Screening for certain conditions.  Healthy lifestyle choices, such as eating a healthy diet, getting regular exercise, not using drugs or products that contain nicotine and tobacco, and limiting alcohol use. What can I expect for my preventive care visit? Physical exam Your health care provider will check your:  Height and weight. This may be used to calculate body mass index (BMI), which tells if you are at a healthy weight.  Heart rate and blood pressure.  Skin for abnormal spots. Counseling Your health care provider may ask you questions about your:  Alcohol, tobacco, and drug use.  Emotional well-being.  Home and relationship well-being.  Sexual activity.  Eating habits.  Work and work Statistician.  Method of birth control.  Menstrual cycle.  Pregnancy history. What immunizations do I need?  Influenza (flu) vaccine  This is recommended every year. Tetanus, diphtheria, and pertussis (Tdap) vaccine  You may need a Td booster every 10 years. Varicella (chickenpox) vaccine  You may need this if you have not been vaccinated. Zoster (shingles) vaccine  You may need this after age 41. Measles, mumps, and rubella (MMR) vaccine  You may need at least one dose of MMR if you were born in 1957 or later. You may also need a second dose. Pneumococcal conjugate (PCV13) vaccine  You may need this if you have certain conditions and were not previously vaccinated. Pneumococcal polysaccharide (PPSV23) vaccine  You  may need one or two doses if you smoke cigarettes or if you have certain conditions. Meningococcal conjugate (MenACWY) vaccine  You may need this if you have certain conditions. Hepatitis A vaccine  You may need this if you have certain conditions or if you travel or work in places where you may be exposed to hepatitis A. Hepatitis B vaccine  You may need this if you have certain conditions or if you travel or work in places where you may be exposed to hepatitis B. Haemophilus influenzae type b (Hib) vaccine  You may need this if you have certain conditions. Human papillomavirus (HPV) vaccine  If recommended by your health care provider, you may need three doses over 6 months. You may receive vaccines as individual doses or as more than one vaccine together in one shot (combination vaccines). Talk with your health care provider about the risks and benefits of combination vaccines. What tests do I need? Blood tests  Lipid and cholesterol levels. These may be checked every 5 years, or more frequently if you are over 16 years old.  Hepatitis C test.  Hepatitis B test. Screening  Lung cancer screening. You may have this screening every year starting at age 55 if you have a 30-pack-year history of smoking and currently smoke or have quit within the past 15 years.  Colorectal cancer screening. All adults should have this screening starting at age 44 and continuing until age 16. Your health care provider may recommend screening at age 53 if you are at increased risk. You will have tests every 1-10 years, depending on your results and  the type of screening test.  Diabetes screening. This is done by checking your blood sugar (glucose) after you have not eaten for a while (fasting). You may have this done every 1-3 years.  Mammogram. This may be done every 1-2 years. Talk with your health care provider about when you should start having regular mammograms. This may depend on whether you have a  family history of breast cancer.  BRCA-related cancer screening. This may be done if you have a family history of breast, ovarian, tubal, or peritoneal cancers.  Pelvic exam and Pap test. This may be done every 3 years starting at age 5. Starting at age 64, this may be done every 5 years if you have a Pap test in combination with an HPV test. Other tests  Sexually transmitted disease (STD) testing.  Bone density scan. This is done to screen for osteoporosis. You may have this scan if you are at high risk for osteoporosis. Follow these instructions at home: Eating and drinking  Eat a diet that includes fresh fruits and vegetables, whole grains, lean protein, and low-fat dairy.  Take vitamin and mineral supplements as recommended by your health care provider.  Do not drink alcohol if: ? Your health care provider tells you not to drink. ? You are pregnant, may be pregnant, or are planning to become pregnant.  If you drink alcohol: ? Limit how much you have to 0-1 drink a day. ? Be aware of how much alcohol is in your drink. In the [place] one drink equals one 12 oz bottle of beer (355 mL), one 5 oz glass of wine (148 mL), or one 1 oz glass of hard liquor (44 mL). Lifestyle  Take daily care of your teeth and gums.  Stay active. Exercise for at least 30 minutes on 5 or more days each week.  Do not use any products that contain nicotine or tobacco, such as cigarettes, e-cigarettes, and chewing tobacco. If you need help quitting, ask your health care provider.  If you are sexually active, practice safe sex. Use a condom or other form of birth control (contraception) in order to prevent pregnancy and STIs (sexually transmitted infections).  If told by your health care provider, take low-dose aspirin daily starting at age 31. What's next?  Visit your health care provider once a year for a well check visit.  Ask your health care provider how often you should have your eyes and teeth  checked.  Stay up to date on all vaccines. This information is not intended to replace advice given to you by your health care provider. Make sure you discuss any questions you have with your health care provider. Document Revised: 12/10/2017 Document Reviewed: 12/10/2017 Elsevier Patient Education  2020 [employer].

## 2019-06-09 NOTE — Progress Notes (Signed)
Annual Exam   Chief Complaint:  Chief Complaint  Patient presents with  . Annual Exam    Stopped atorvastatin last Fall due to stomach and muscle pain.  . Back Issues  . Sees GYN    Pap several months ago.    History of Present Illness:  [name] is a 59 y.o. G1P1001 who LMP was Patient's last menstrual period was 11/01/2015., presents today for her annual examination.    #Back issues - had a shoulder injury at work - went to PT - was getting low back pain on the opposite side of the shoulder - this is getting worse - does a lot of moving and twisting at work which makes this worse - taking Robaxin at night to try and get comfortable  - not interested in physical therapy at this time - can feel a knot in the area and this is bigger when pain is worse - treatment: robaxin, chiropractor - hx of scoliosis  Nutrition She does get adequate calcium and Vitamin D in her diet. Diet: too much milk/dairy Exercise: yard work, active job  Engineer, materials The patient wears seatbelts: yes.     The patient feels safe at home and in their relationships: yes.   Menstrual No periods since breast cancer treatment  GYN She is not sexually active.    Cervical Cancer Screening:   Last Pap:   2020 with GYN  Breast Cancer Screening Hx of breast cancer Last Mammogram: June 2020 The patient does want a mammogram this year.    Colon Cancer Screening Age 48-75 yo - benefits outweigh the risk. Adults 57-85 yo who have never been screened benefit.  Benefits: [id number] people in 2016 will be diagnosed and 49,000 will die - early detection helps [place]: Complications 2/2 to colonoscopy High Risk (Colonoscopy): genetic disorder (Lynch syndrome or familial adenomatous polyposis), personal hx of IBD, previous adenomatous polyp, or previous colorectal cancer, FamHx start 10 years before the age at diagnosis, increased in males and black race  Options:  FIT - looks for hemoglobin (blood in the  stool) - specific and fairly sensitive - must be done annually Cologuard - looks for DNA and blood - more sensitive - therefore can have more false positives, every 3 years Colonoscopy - every 10 years if normal - sedation, bowl prep, must have someone drive you  Shared decision making and the patient had decided to do colonoscopy - 5 year recall and not due at this time.   Lung Cancer Screening Annual screening for adults age 61-80 yo with 30 year pack history? Yes Current Tobacco user? Yes Quit less than 15 years ago? No Interested in low dose CT for lung cancer screening? no  Weight Wt Readings from Last 3 Encounters:  06/09/19 133 lb (60.3 kg)  12/03/18 120 lb (54.4 kg)  04/20/18 133 lb 8 oz (60.6 kg)   Patient has normal BMI  BMI Readings from Last 1 Encounters:  06/09/19 24.72 kg/m     Chronic disease screening Blood pressure monitoring:  BP Readings from Last 3 Encounters:  06/09/19 122/70  04/20/18 104/72  03/09/18 104/66    Lipid Monitoring: Indication for screening: age >8, obesity, diabetes, family hx, CV risk factors.  Lipid screening: Yes  Lab Results  Component Value Date   CHOL 240 (H) 04/29/2018   HDL 55 04/29/2018   LDLCALC 165 (H) 04/29/2018   TRIG 102 04/29/2018   CHOLHDL 4.4 04/29/2018   Stopped atorvastatin in April of 2020  Diabetes Screening: age >10, overweight, family hx, PCOS, hx of gestational diabetes, at risk ethnicity Diabetes Screening screening: Not Indicated  Lab Results  Component Value Date   HGBA1C 5.5 12/09/2018     Past Medical History:  Diagnosis Date  . Allergy   . Anemia   . Breast cancer ([place]) 2013   left breast, DCIS, radiation  . Hx of migraine headaches   . Personal history of radiation therapy 2013   F/U from lumpectomy for DCIS    Past Surgical History:  Procedure Laterality Date  . BREAST BIOPSY Left 01/12/12   positive,DCIS  . BREAST BIOPSY Right 2012   negative  . BREAST BIOPSY Right 2017   2  MM INTRADUCTAL PAPILLOMA.   Marland Kitchen BREAST CYST ASPIRATION Left negative  . BREAST EXCISIONAL BIOPSY Left 01/19/12   positve, DCIS  . BREAST LUMPECTOMY Left 2013   DCIS, lumpectomy F/U radiation   . BREAST SURGERY    . CESAREAN SECTION    . COLONOSCOPY    . POLYPECTOMY     hyperplastic polyps  . TUBAL LIGATION      Prior to Admission medications   Medication Sig Start Date End Date Taking? Authorizing Provider  aspirin EC 81 MG tablet Take 1 tablet (81 mg total) by mouth daily. 04/20/18  Yes Lesleigh Noe, MD  fluticasone (FLONASE) 50 MCG/ACT nasal spray Place 2 sprays into both nostrils daily. 08/17/18  Yes Maury Dus, NP  methocarbamol (ROBAXIN) 750 MG tablet Take 750 mg by mouth 3 (three) times daily.   Yes [provider]  Multiple Vitamins-Minerals (WOMENS MULTIVITAMIN PO) Take 1 tablet by mouth daily.   Yes [provider]    Allergies  Allergen Reactions  . Latex Rash    Gynecologic History: Patient's last menstrual period was 11/01/2015.  Obstetric History: G1P1001  Social History   Socioeconomic History  . Marital status: Single    Spouse name: Not on file  . Number of children: Not on file  . Years of education: Not on file  . Highest education level: Not on file  Occupational History  . Not on file  Tobacco Use  . Smoking status: Current Every Day Smoker    Packs/day: 1.00    Years: 35.00    Pack years: 35.00    Types: Cigarettes  . Smokeless tobacco: Never Used  Substance and Sexual Activity  . Alcohol use: Yes    Comment: social, occasional  . Drug use: No  . Sexual activity: Not Currently    Birth control/protection: Surgical  Other Topics Concern  . Not on file  Social History Narrative   Marital status: single; not dating since 2012.      Children:  One son; no grandchildren.      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 Difficulty of Paying Living Expenses: Not on file  Food Insecurity:   . Worried About Charity fundraiser in the Last Year: Not on file  . Ran Out of Food in the Last Year: Not on file  Transportation Needs:   . Lack of Transportation (Medical): Not on file  . Lack of Transportation (Non-Medical): Not on file  Physical Activity:   . Days of Exercise per Week: Not  on file  . Minutes of Exercise per Session: Not on file  Stress:   . Feeling of Stress : Not on file  Social Connections:   . Frequency of Communication with Friends and Family: Not on file  . Frequency of Social Gatherings with Friends and Family: Not on file  . Attends Religious Services: Not on file  . Active Member of Clubs or Organizations: Not on file  . Attends Archivist Meetings: Not on file  . Marital Status: Not on file  Intimate Partner Violence:   . Fear of Current or Ex-Partner: Not on file  . Emotionally Abused: Not on file  . Physically Abused: Not on file  . Sexually Abused: Not on file    Family History  Problem Relation Age of Onset  . Arthritis Mother   . Heart disease Father 72       AMI  . Arthritis Sister   . Kidney disease Sister   . Learning disabilities Sister   . Hypertension Sister   . Hyperlipidemia Sister   . Colon cancer Neg Hx   . Colon polyps Neg Hx   . Stomach cancer Neg Hx   . Rectal cancer Neg Hx   . Esophageal cancer Neg Hx     Review of Systems  Constitutional: Negative for chills and fever.  HENT: Negative for congestion and sore throat.   Eyes: Negative for blurred vision and double vision.  Respiratory: Negative for shortness of breath.   Cardiovascular: Negative for chest pain.  Gastrointestinal: Negative for heartburn, nausea and vomiting.  Genitourinary: Negative.   Musculoskeletal: Positive for back pain. Negative for myalgias.  Skin: Negative for rash.  Neurological: Negative for dizziness  and headaches.  Endo/Heme/Allergies: Does not bruise/bleed easily.  Psychiatric/Behavioral: Negative for depression. The patient is not nervous/anxious.      Physical Exam BP 122/70 (BP Location: Right Arm, Patient Position: Sitting, Cuff Size: Normal)   Pulse 98   Temp 98.1 F (36.7 C)   Ht 5' 1.5" (1.562 m)   Wt 133 lb (60.3 kg)   LMP 11/01/2015   SpO2 98%   BMI 24.72 kg/m    BP Readings from Last 3 Encounters:  06/09/19 122/70  04/20/18 104/72  03/09/18 104/66      Physical Exam Constitutional:      General: She is not in acute distress.    Appearance: She is well-developed. She is not diaphoretic.  HENT:     Right Ear: Tympanic membrane and external ear normal.     Left Ear: Tympanic membrane and external ear normal.     Nose: Nose normal.  Eyes:     Conjunctiva/sclera: Conjunctivae normal.  Cardiovascular:     Rate and Rhythm: Normal rate and regular rhythm.     Heart sounds: No murmur.  Pulmonary:     Effort: Pulmonary effort is normal. No respiratory distress.     Breath sounds: Normal breath sounds. No wheezing.  Abdominal:     General: Abdomen is flat. Bowel sounds are normal. There is no distension.     Palpations: Abdomen is soft.     Tenderness: There is no abdominal tenderness. There is no guarding.  Musculoskeletal:     Cervical back: Neck supple.     Comments: Back:  Inspection: right side hypertrophy and curvature of the spine Palpation: TTP along the left lumbar paraspinous muscles, no spinous ttp ROM: Normal Strength: normal LE strength  Skin:    General: Skin is warm and dry.  Capillary Refill: Capillary refill takes less than 2 seconds.  Neurological:     Mental Status: She is alert. Mental status is at baseline.  Psychiatric:        Mood and Affect: Mood normal.        Behavior: Behavior normal.    Results:  PHQ-9:  Depression screen [facility] 2/9 06/09/2019  Decreased Interest 0  Down, Depressed, Hopeless 0  PHQ - 2 Score 0         Assessment: 59 y.o. G9P1001 female here for routine annual physical examination.  Plan: Problem List Items Addressed This Visit      Cardiovascular and Mediastinum   Atherosclerosis of artery - Primary   Relevant Orders   Lipid panel    Other Visit Diagnoses    Medication refill       Relevant Medications   fluticasone (FLONASE) 50 MCG/ACT nasal spray   Seasonal allergic rhinitis due to pollen       Relevant Medications   fluticasone (FLONASE) 50 MCG/ACT nasal spray   Chronic left-sided low back pain without sciatica       Relevant Medications   methocarbamol (ROBAXIN) 750 MG tablet   Other Relevant Orders   Ambulatory referral to Physical Therapy   Scoliosis, unspecified scoliosis type, unspecified spinal region       Relevant Orders   Ambulatory referral to Physical Therapy      Screening: -- Blood pressure screen normal -- cholesterol screening: will obtain -- Weight screening: overweight: continue to monitor -- Diabetes Screening: not due for screening -- Nutrition: normal - encouraged healthy diet  The 10-year ASCVD risk score Mikey Bussing DC Jr., et al., 2013) is: 5.6%   Values used to calculate the score:     Age: 51 years     Sex: Female     Is Non-Hispanic African American: No     Diabetic: No     Tobacco smoker: Yes     Systolic Blood Pressure: 123XX123 mmHg     Is BP treated: No     HDL Cholesterol: 55 mg/dL     Total Cholesterol: 240 mg/dL  -- Statin therapy for Age 29-75 with CVD risk >7.5%  Psych -- Depression screening (PHQ-9): Low risk  Safety -- tobacco screening: encouraged cessation, working on quitting -- alcohol screening:  low-risk usage. -- no evidence of domestic violence or intimate partner violence.   Cancer Screening -- pap smear not collected per ASCCP guidelines -- family history of breast cancer screening: done. not at high risk. -- Mammogram - pt gets reminders from [place], due this summer -- Colon cancer -- up todate -- Lung  cancer -- declined today, will consider   Immunizations -- flu vaccine up to date -- TDAP q10 years up to date   Encourage healthy diet/exercise  PT referral for low back pain. Consider imaging if no improvement in 6 weeks   Lesleigh Noe, MD

## 2019-06-10 LAB — LIPID PANEL
Cholesterol: 216 mg/dL — ABNORMAL HIGH (ref 0–200)
HDL: 43.4 mg/dL (ref >39.00)
NonHDL: 172.38
Total CHOL/HDL Ratio: 5
Triglycerides: 227 mg/dL — ABNORMAL HIGH (ref 0.0–149.0)
VLDL: 45.4 mg/dL — ABNORMAL HIGH (ref 0.0–40.0)

## 2019-06-10 LAB — LDL CHOLESTEROL, DIRECT: Direct LDL: 135 mg/dL

## 2019-06-14 IMAGING — CR DG THORACIC SPINE 2V
1 series · 3 of 3 positions shown · non-contrast
Comparison: 10/06/2016

CLINICAL DATA: Upper back pain for 2 months, no known injury,
initial encounter

EXAM:
THORACIC SPINE 2 VIEWS

[Series 1: dg thoracic spine 2 view · 0.14mm/px · 3 of 3 slices shown]
[im 1/3]
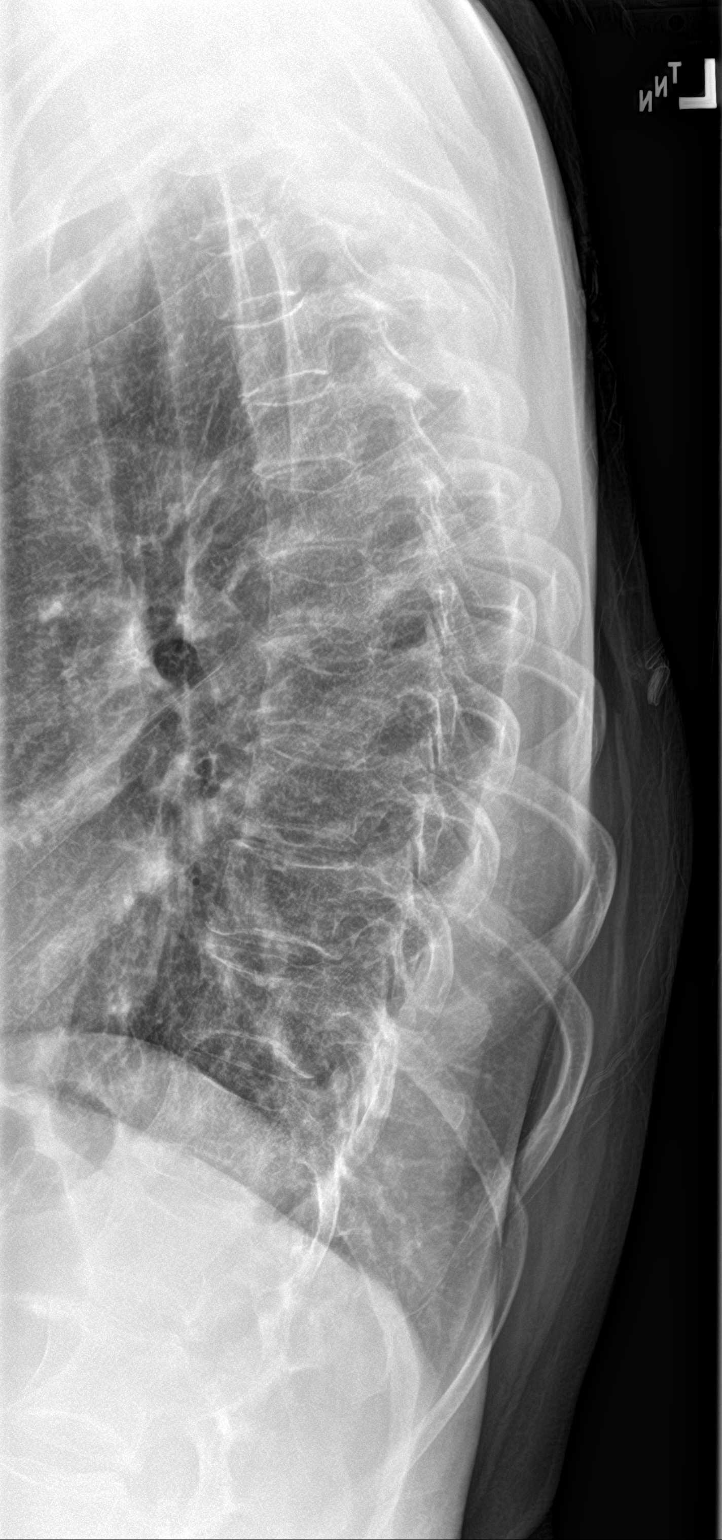
[im 2/3]
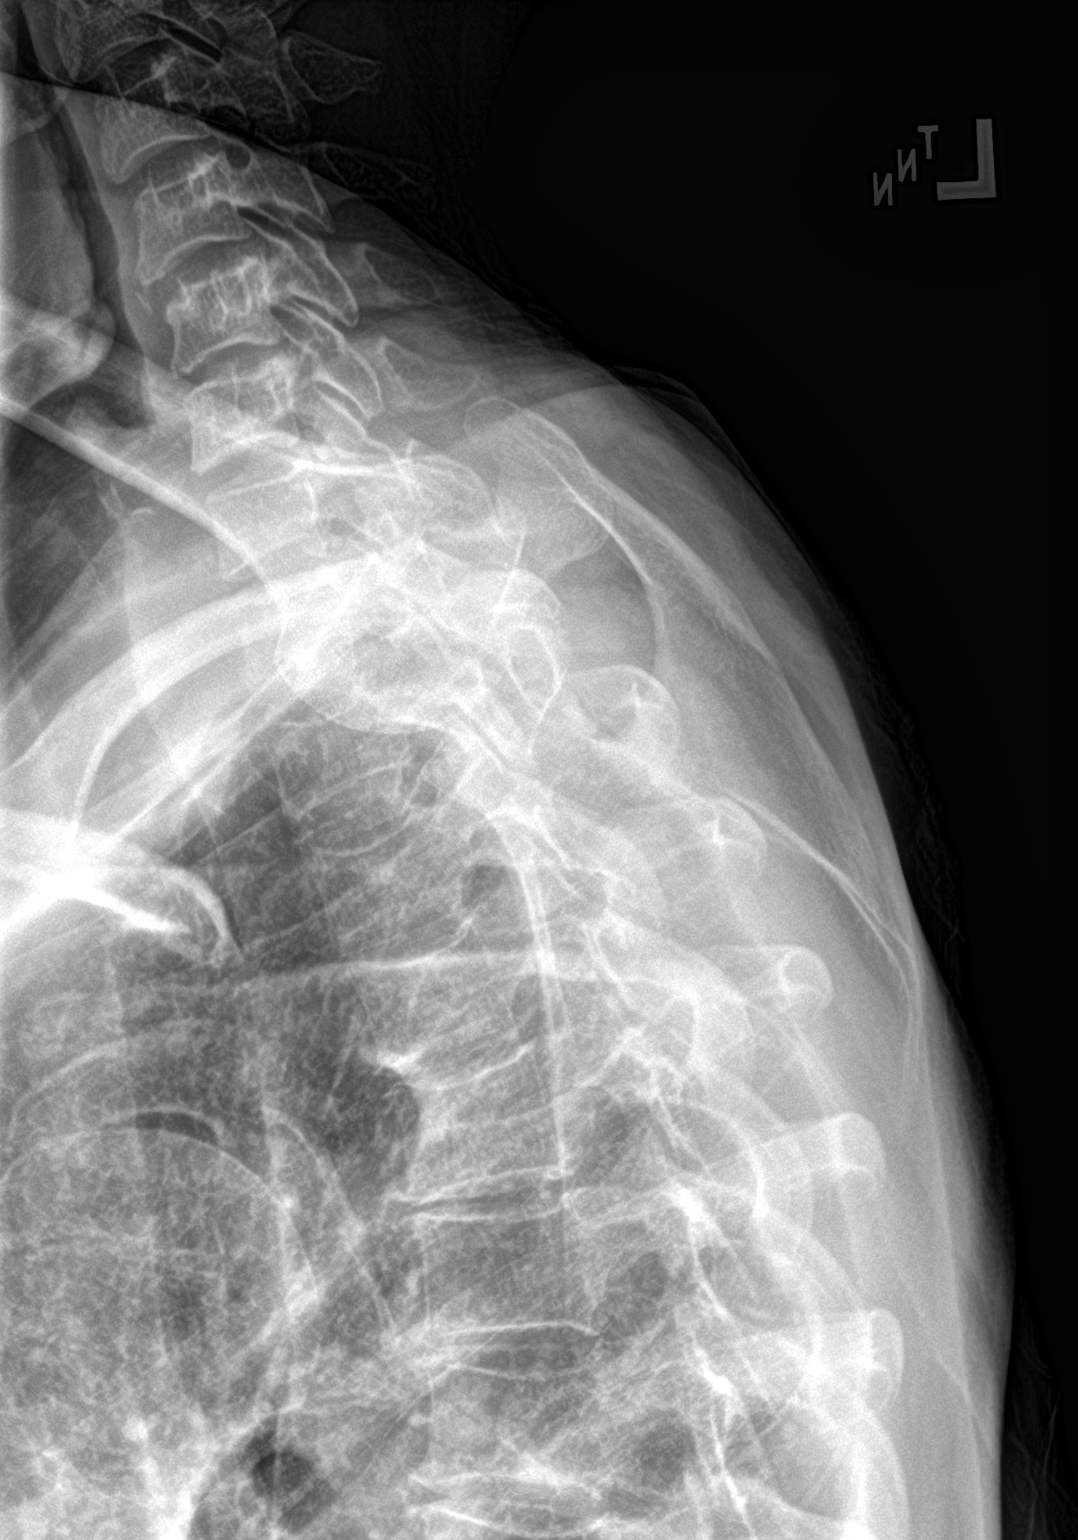
[im 3/3]
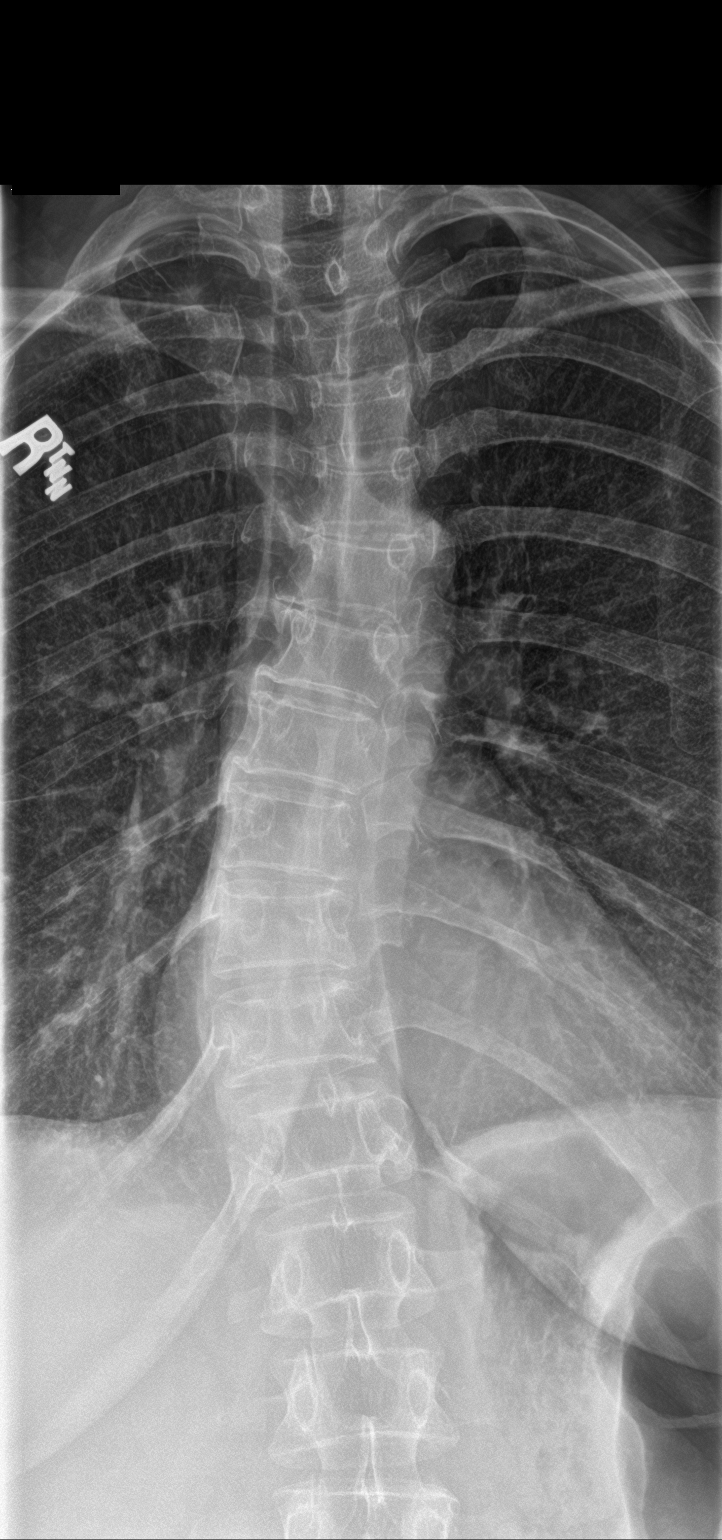

[3 of 3 positions shown; findings below may reference images not displayed]

FINDINGS: Mild S-shaped scoliosis is noted in the thoracic spine concave to
the right superiorly and concave to the left inferiorly. The overall
appearance is similar to that seen on prior plain film examination.
No acute bony abnormality is seen. No paraspinal mass lesion is
noted.
IMPRESSION: Stable scoliosis without acute abnormality.

## 2019-06-14 IMAGING — CR DG CHEST 2V
1 series · 2 of 2 positions shown · non-contrast
Comparison: 10/06/2016

CLINICAL DATA: Posterior chest pain while bending for 2 months,
initial encounter

EXAM:
CHEST - 2 VIEW

[Series 1: dg chest 2 view · 0.14mm/px · 2 of 2 slices shown]
[im 1/2]
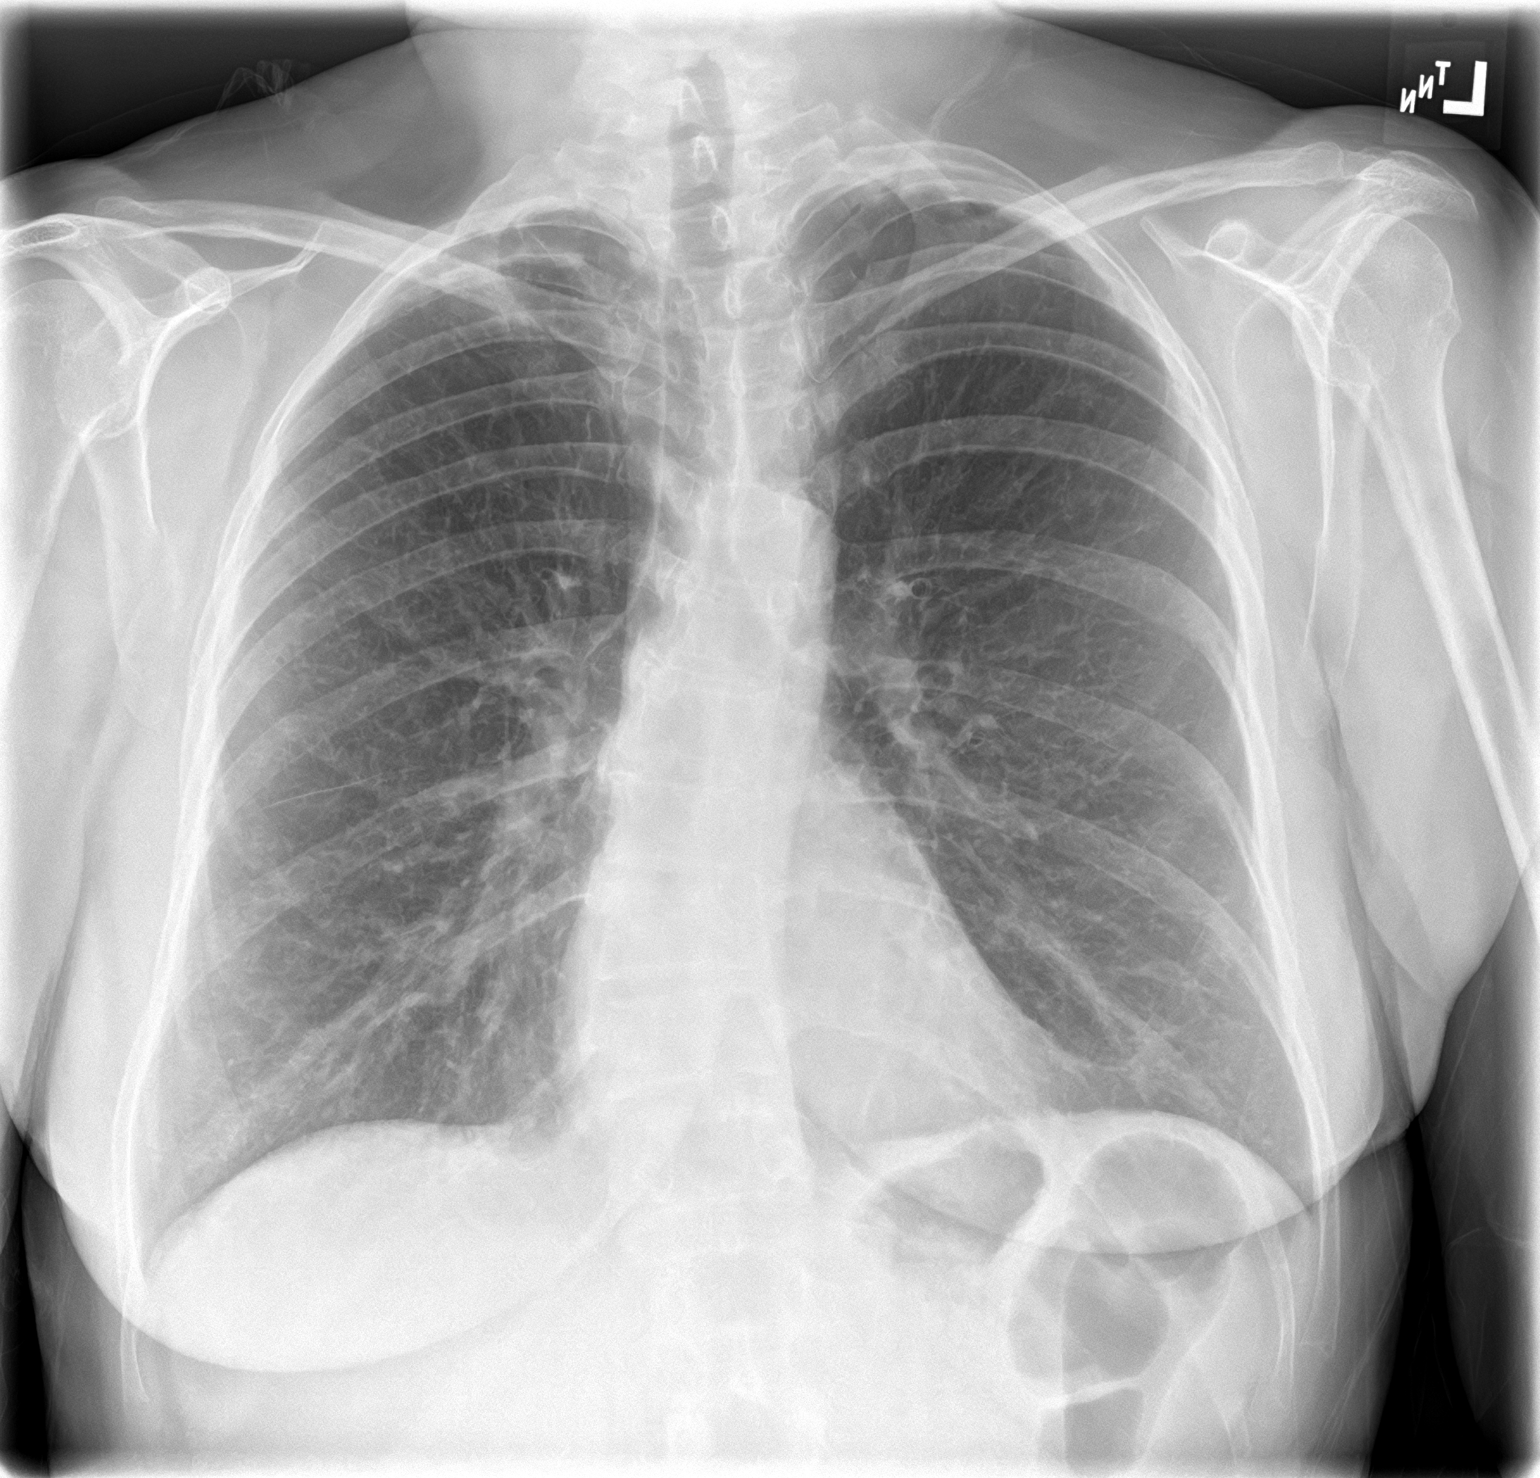
[im 2/2]
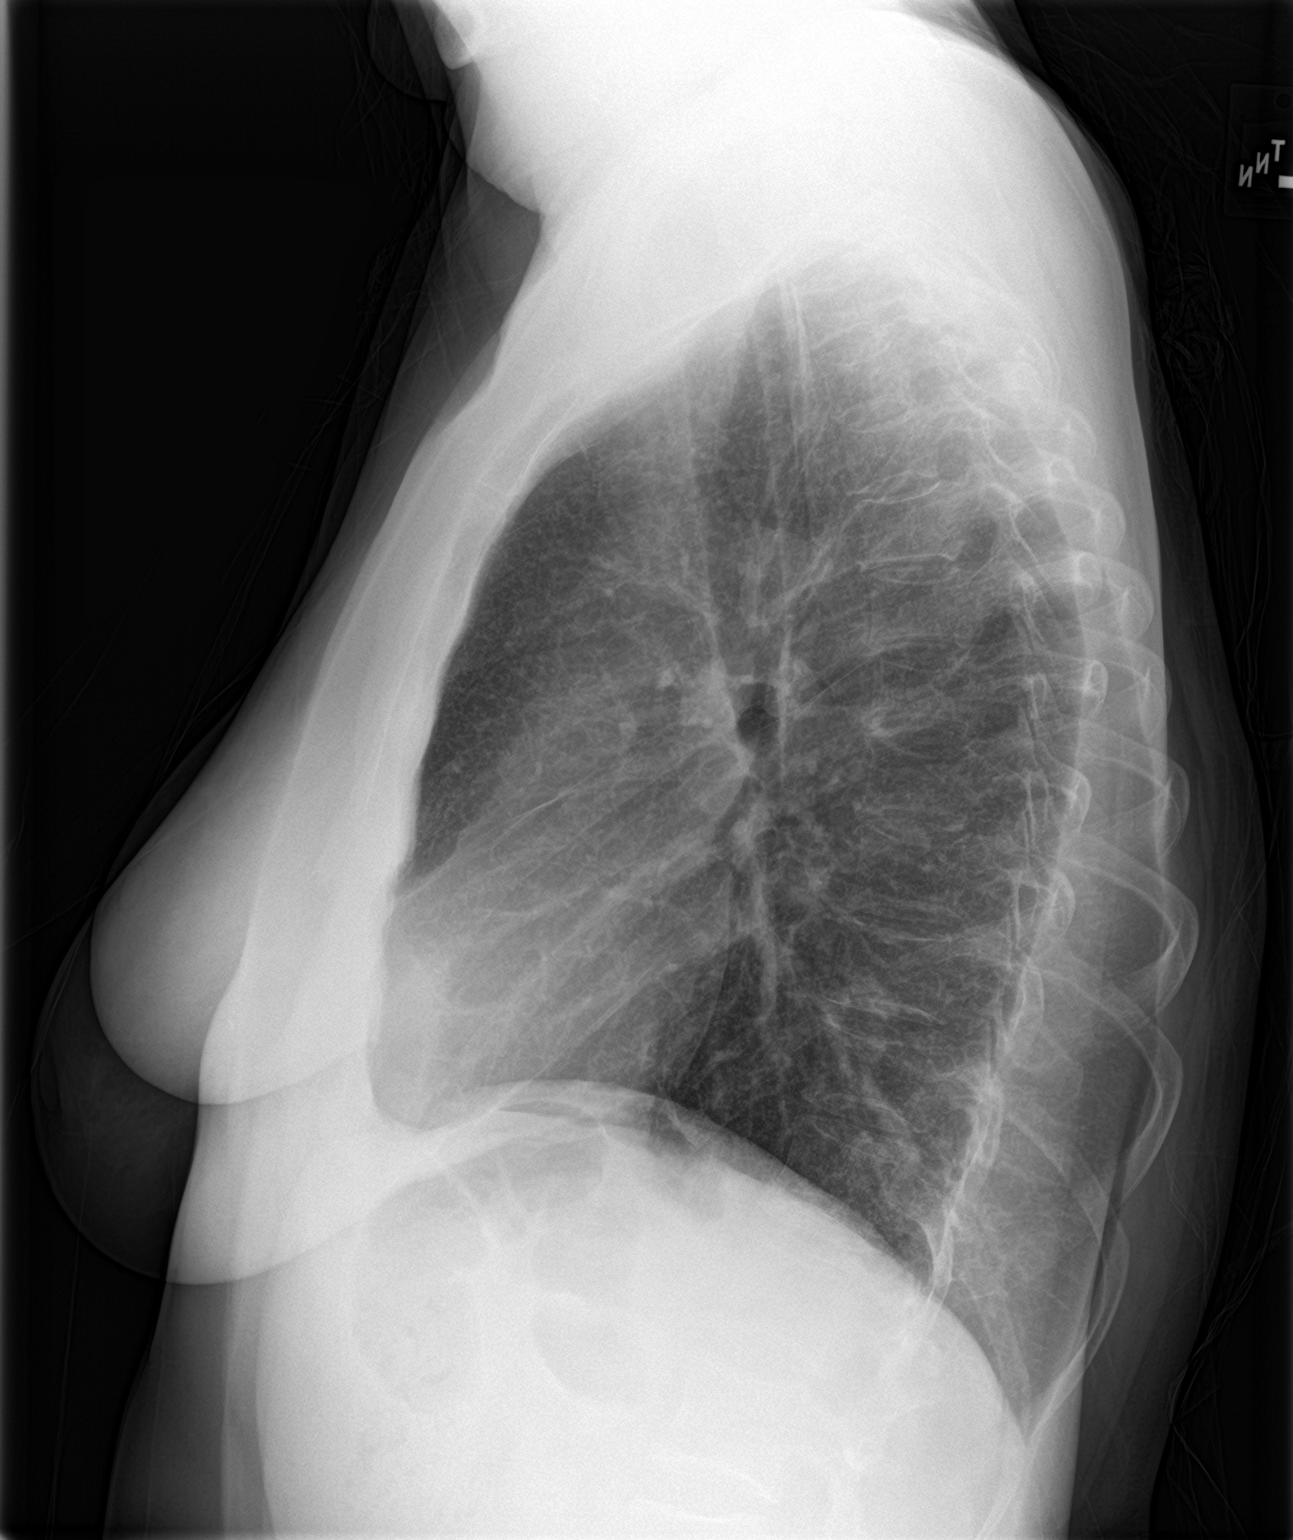

[2 of 2 positions shown; findings below may reference images not displayed]

FINDINGS: Cardiac shadow is within normal limits. The lungs are well aerated
bilaterally. No focal infiltrate or sizable effusion is seen. Mild
stable S-shaped scoliosis of the thoracic spine is noted. No acute
bony abnormality is seen.
IMPRESSION: No active cardiopulmonary disease.

## 2019-06-14 IMAGING — CR DG LUMBAR SPINE COMPLETE 4+V
1 series · 5 of 5 positions shown · non-contrast
Comparison: None.

CLINICAL DATA: 56-year-old female with a history right-sided lumbar
back pain

EXAM:
LUMBAR SPINE - COMPLETE 4+ VIEW

[Series 1: dg lumbar spine complete 4 +v · 0.14mm/px · 5 of 5 slices shown]
[im 1/5]
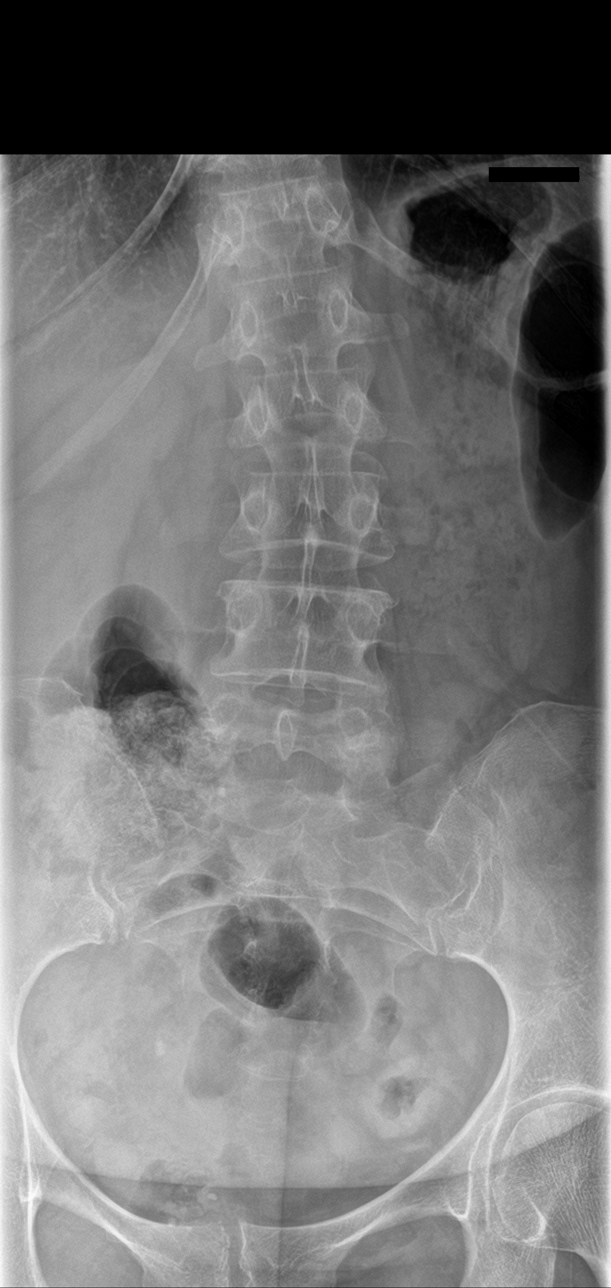
[im 2/5]
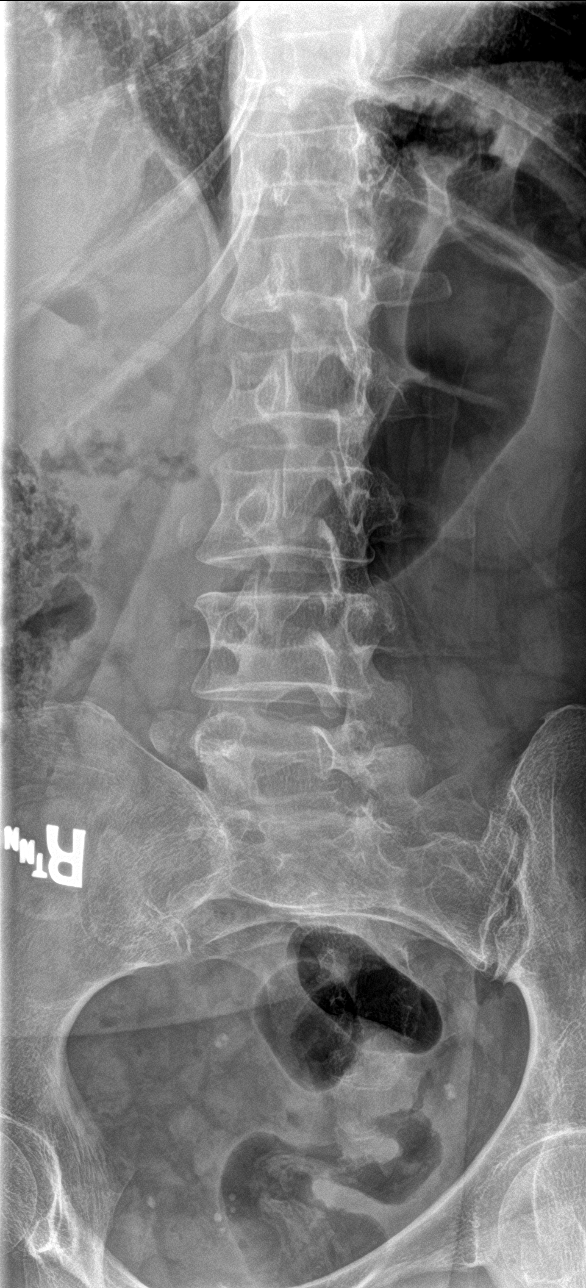
[im 3/5]
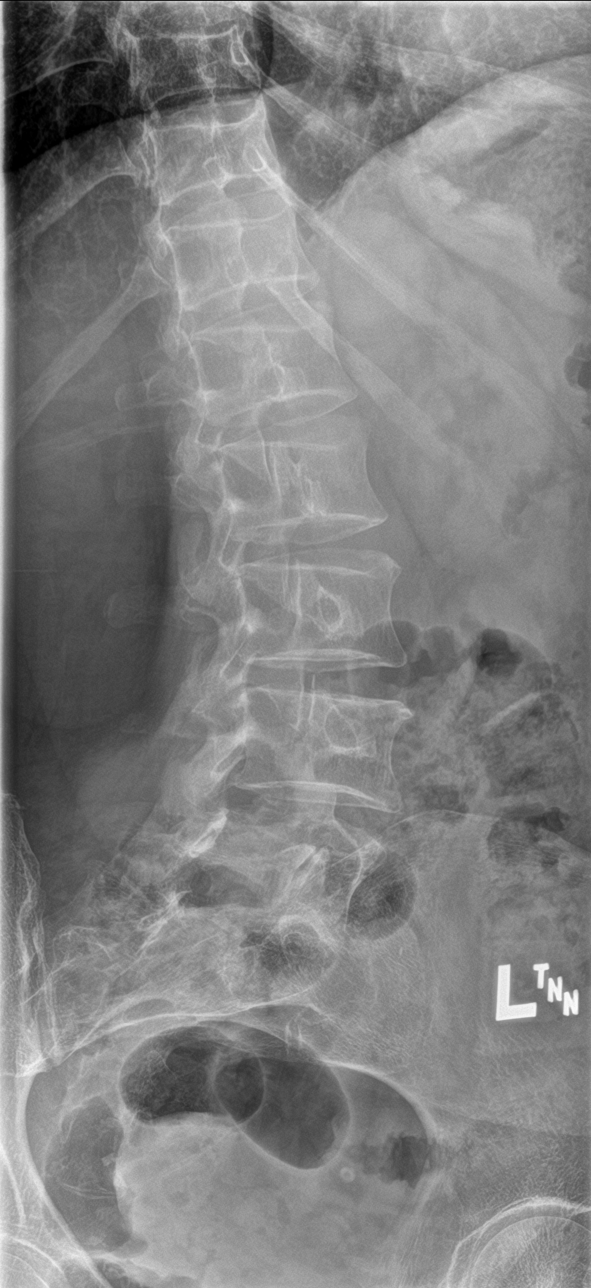
[im 4/5]
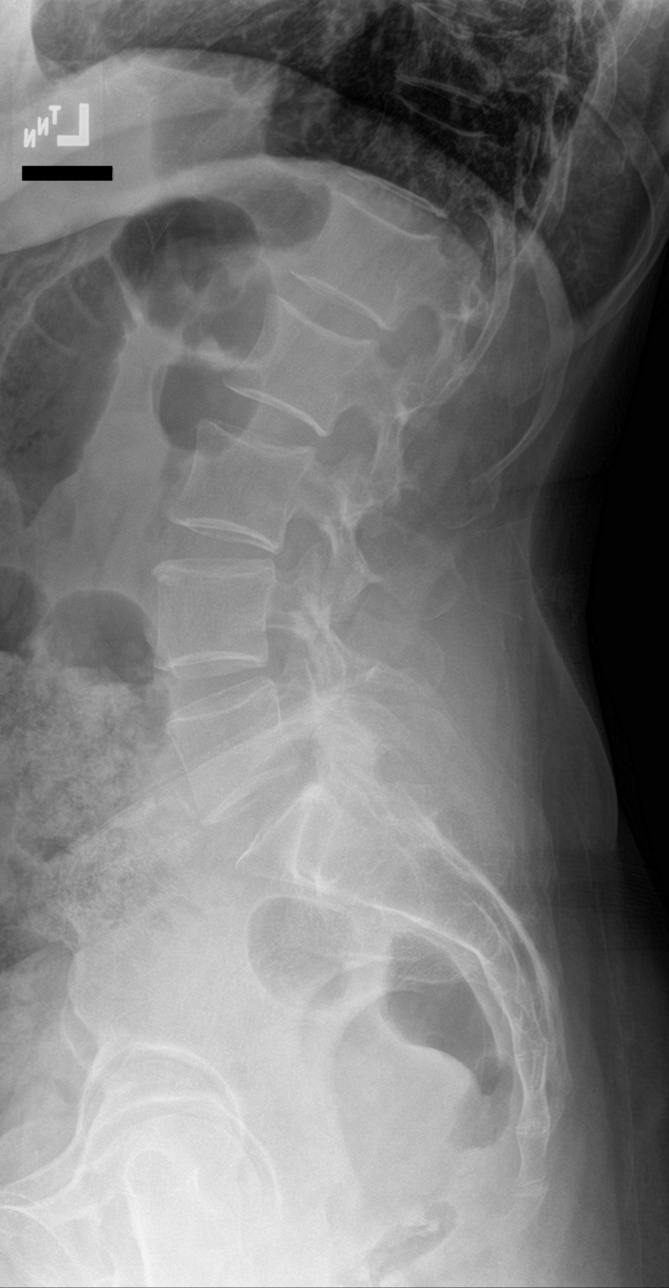
[im 5/5]
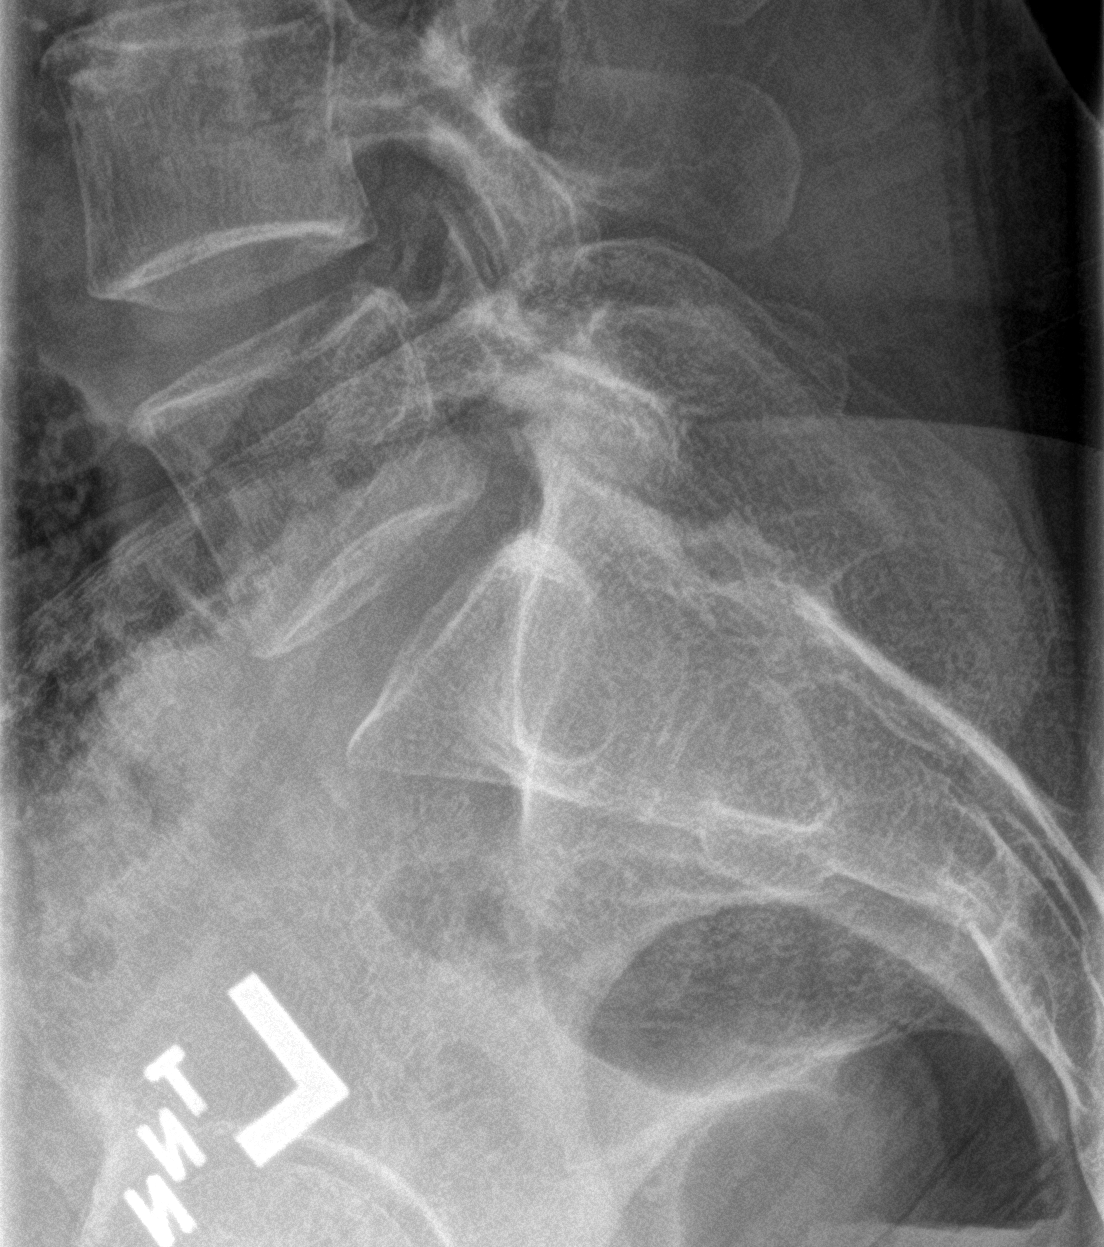

[5 of 5 positions shown; findings below may reference images not displayed]

FINDINGS: Lumbar Spine:

Lumbar vertebral elements maintain normal alignment without evidence
of anterolisthesis, retrolisthesis, subluxation.

No fracture line identified. Vertebral body heights maintained as
well as disc space heights.

No significant degenerative disc disease or endplate changes. Mild
facet changes of L5-S1..

Unremarkable appearance of the visualized abdomen.

Oblique images demonstrate no displaced pars defect.
IMPRESSION: Negative for acute fracture malalignment of the lumbar spine.

## 2019-06-28 DIAGNOSIS — L814 Other melanin hyperpigmentation: Secondary | ICD-10-CM | POA: Diagnosis not present

## 2019-07-15 DIAGNOSIS — L84 Corns and callosities: Secondary | ICD-10-CM | POA: Diagnosis not present

## 2019-07-15 DIAGNOSIS — L821 Other seborrheic keratosis: Secondary | ICD-10-CM | POA: Diagnosis not present

## 2019-08-03 ENCOUNTER — Telehealth: Payer: Self-pay

## 2019-08-03 NOTE — Telephone Encounter (Signed)
06-09-19 PPT Referral closed.  See Referral Notes/hx.  Pt has not responded to phone calls or Unable to Contact letter.  Nicole Kindred PT has [facility] for pt to call several times.  No response from pt.

## 2019-10-24 ENCOUNTER — Other Ambulatory Visit: Payer: Self-pay | Admitting: Family Medicine

## 2019-10-24 DIAGNOSIS — Z1231 Encounter for screening mammogram for malignant neoplasm of breast: Secondary | ICD-10-CM

## 2019-11-01 ENCOUNTER — Ambulatory Visit
Admission: RE | Admit: 2019-11-01 | Discharge: 2019-11-01 | Disposition: A | Discharge status: Home / Self Care | Payer: BC Managed Care – PPO | Source: Ambulatory Visit | Attending: Family Medicine | Admitting: Family Medicine

## 2019-11-01 DIAGNOSIS — Z1231 Encounter for screening mammogram for malignant neoplasm of breast: Secondary | ICD-10-CM | POA: Diagnosis not present

## 2019-11-03 ENCOUNTER — Other Ambulatory Visit: Payer: Self-pay | Admitting: Family Medicine

## 2019-11-03 DIAGNOSIS — N6489 Other specified disorders of breast: Secondary | ICD-10-CM

## 2019-11-03 DIAGNOSIS — R928 Other abnormal and inconclusive findings on diagnostic imaging of breast: Secondary | ICD-10-CM

## 2019-11-15 ENCOUNTER — Other Ambulatory Visit: Payer: Self-pay

## 2019-11-15 ENCOUNTER — Ambulatory Visit
Admission: RE | Admit: 2019-11-15 | Discharge: 2019-11-15 | Disposition: A | Discharge status: Home / Self Care | Payer: BC Managed Care – PPO | Source: Ambulatory Visit | Attending: Family Medicine | Admitting: Family Medicine

## 2019-11-15 DIAGNOSIS — N6489 Other specified disorders of breast: Secondary | ICD-10-CM

## 2019-11-15 DIAGNOSIS — R928 Other abnormal and inconclusive findings on diagnostic imaging of breast: Secondary | ICD-10-CM | POA: Diagnosis not present

## 2019-11-15 DIAGNOSIS — N6311 Unspecified lump in the right breast, upper outer quadrant: Secondary | ICD-10-CM | POA: Diagnosis not present

## 2019-11-15 DIAGNOSIS — R922 Inconclusive mammogram: Secondary | ICD-10-CM | POA: Diagnosis not present

## 2019-12-22 ENCOUNTER — Other Ambulatory Visit: Payer: Self-pay | Admitting: Family Medicine

## 2019-12-22 DIAGNOSIS — J301 Allergic rhinitis due to pollen: Secondary | ICD-10-CM

## 2019-12-22 DIAGNOSIS — Z760 Encounter for issue of repeat prescription: Secondary | ICD-10-CM

## 2020-01-20 ENCOUNTER — Other Ambulatory Visit: Payer: Self-pay

## 2020-01-20 ENCOUNTER — Ambulatory Visit: Payer: 59

## 2020-01-20 ENCOUNTER — Telehealth: Payer: BC Managed Care – PPO | Admitting: Nurse Practitioner

## 2020-01-20 DIAGNOSIS — Z20822 Contact with and (suspected) exposure to covid-19: Secondary | ICD-10-CM

## 2020-01-20 LAB — POC COVID19 BINAXNOW: SARS Coronavirus 2 Ag: NEGATIVE

## 2020-01-20 MED ORDER — AMOXICILLIN-POT CLAVULANATE 875-125 MG PO TABS: 1.0000 | ORAL_TABLET | Freq: Two times a day (BID) | ORAL | 0 refills | Status: DC

## 2020-01-20 NOTE — Progress Notes (Signed)
Subjective:    Patient ID: Linda Tran, female    DOB: 1960-09-23, 59 y.o.   MRN: [id number]  HPI   59 year old female who has had nasal congestion sneezing and sinus pressure for the past 3 weeks that has gotten worse in the past 24 hours. She is having a difficult time wearing her mask at work because her nose is so congested and constantly running. She has also developed a cough without wheezing. She denies a history of asthma, has never had bronchitis or pneumonia, has never used an inhaler, has rarely had a sinus infection.  She has been fully vaccinated for COVID with the Moderna vaccine and has no known sick contacts.   She has been using her flonase and zyrtec daily, has also tried Delsym and Robotussin OTC for relief.    Current Outpatient Medications:  .  diphenhydrAMINE (SOMINEX) 25 MG tablet, Take by mouth., Disp: , Rfl:  .  amoxicillin-clavulanate (AUGMENTIN) 875-125 MG tablet, Take 1 tablet by mouth 2 (two) times daily., Disp: 20 tablet, Rfl: 0 .  aspirin EC 81 MG tablet, Take 1 tablet (81 mg total) by mouth daily., Disp: 90 tablet, Rfl: 3 .  azelastine (ASTELIN) 0.1 % nasal spray, azelastine 137 mcg (0.1 %) nasal spray aerosol, Disp: , Rfl:  .  fluticasone (FLONASE) 50 MCG/ACT nasal spray, SPRAY 2 SPRAYS INTO BOTH NOSTRILS DAILY, Disp: 48 g, Rfl: 3 .  methocarbamol (ROBAXIN) 750 MG tablet, Take 1 tablet (750 mg total) by mouth 3 (three) times daily., Disp: 30 tablet, Rfl: 3 .  Multiple Vitamins-Minerals (WOMENS MULTIVITAMIN PO), Take 1 tablet by mouth daily., Disp: , Rfl:  .  NEOMYCIN-POLYMYXIN-HYDROCORTISONE (CORTISPORIN) 1 % SOLN OTIC solution, neomycin-polymyxin-hydrocort 3.5 mg/mL-10,000 unit/mL-1 % ear solution, Disp: , Rfl:    Review of Systems  Constitutional: Positive for fatigue. Negative for fever.  HENT: Positive for congestion, ear pain, postnasal drip, rhinorrhea and sinus pressure.   Respiratory: Positive for cough. Negative for shortness of breath and  wheezing.   Cardiovascular: Negative.   Neurological: Negative.    Past Medical History:  Diagnosis Date  . Allergy   . Anemia   . Breast cancer ([place]) 2013   left breast, DCIS, radiation  . Hx of migraine headaches   . Personal history of radiation therapy 2013   F/U from lumpectomy for DCIS      Objective:   Physical Exam This was a telehealth appoitment after COVID testing with nurse   Recent Results (from the past 2160 hour(s))  POC COVID-19     Status: Normal   Collection Time: 01/20/20 11:15 AM  Result Value Ref Range   SARS Coronavirus 2 Ag Negative Negative    Comment: Vaccinated, symptomatic patient aware of negarive POC results. Has telephone visit with S.Savannah Morford FNP for determination of PCR and further instructions.    Meds ordered this encounter  Medications  . amoxicillin-clavulanate (AUGMENTIN) 875-125 MG tablet    Sig: Take 1 tablet by mouth 2 (two) times daily.    Dispense:  20 tablet    Refill:  0       Assessment & Plan:  Patient treated for sinusitis due to length of symptoms and worsening symptoms and failure to improve with OTCs  Discussed continuing to use an OTC cough support like Mucinex per package recommendations  Continue abx for 10 days and take with food - Augmentin BID for 10 days.   Return to clinic if symptoms persist or worsen as discussed.  Continue allergy medications

## 2020-02-01 ENCOUNTER — Ambulatory Visit: Payer: 59 | Admitting: Nurse Practitioner

## 2020-02-01 ENCOUNTER — Other Ambulatory Visit: Payer: Self-pay

## 2020-02-01 ENCOUNTER — Encounter: Payer: Self-pay | Admitting: Nurse Practitioner

## 2020-02-01 VITALS — BP 96/62 | HR 98 | Temp 97.5°F | Resp 16 | Ht 62.0 in | Wt 127.0 lb

## 2020-02-01 DIAGNOSIS — H938X2 Other specified disorders of left ear: Secondary | ICD-10-CM

## 2020-02-01 NOTE — Progress Notes (Signed)
Subjective:    Patient ID: Linda Tran, female    DOB: 1961-02-26, 59 y.o.   MRN: [id number]  HPI   59 year old female with a two year history of left ear irritation. She believes that her symptoms started after a tooth extraction about 2 years ago. She then started to experience more sinus issues. She routinely has more sinus irritation with season changes, stays on Flonase and Zyrtec year round. She was most recently seen at [facility] for a sinus infection on 01/20/2020. She also had negative COVID testing at that time. She has finished her course of Augmentin and feels her sinus symptoms have mostly resolved, she has an occasional runny nose but her left ear has continued to bother her.   Her left ear will at times feel full, she will put her finger inside her and feel relief. She went to see an ENT two years ago and was told that she had a strep infection in her outer ear and was given antibiotic drops for that. She feels her hearing has been affected at times and that anytime she gets congested her left ear is what bothers her the most. Denies frequent ear infections as a child, denies any surgical history with sinuses or ears.    Current Outpatient Medications:  .  amoxicillin-clavulanate (AUGMENTIN) 875-125 MG tablet, Take 1 tablet by mouth 2 (two) times daily., Disp: 20 tablet, Rfl: 0 .  aspirin EC 81 MG tablet, Take 1 tablet (81 mg total) by mouth daily., Disp: 90 tablet, Rfl: 3 .  fluticasone (FLONASE) 50 MCG/ACT nasal spray, SPRAY 2 SPRAYS INTO BOTH NOSTRILS DAILY, Disp: 48 g, Rfl: 3 .  methocarbamol (ROBAXIN) 750 MG tablet, Take 1 tablet (750 mg total) by mouth 3 (three) times daily., Disp: 30 tablet, Rfl: 3 .  Multiple Vitamins-Minerals (WOMENS MULTIVITAMIN PO), Take 1 tablet by mouth daily., Disp: , Rfl:    Review of Systems  Constitutional: Negative.   HENT: Positive for congestion and ear pain.   Respiratory: Negative.   Cardiovascular: Negative.       Objective:    Physical Exam Constitutional:      Appearance: Normal appearance.  HENT:     Right Ear: Tympanic membrane, ear canal and external ear normal.     Left Ear: Tympanic membrane, ear canal and external ear normal.     Nose:     Right Turbinates: Enlarged.     Left Turbinates: Enlarged.  Eyes:     General:        Right eye: No discharge.        Left eye: No discharge.  Cardiovascular:     Rate and Rhythm: Normal rate and regular rhythm.  Pulmonary:     Effort: Pulmonary effort is normal.     Breath sounds: Normal breath sounds.  Musculoskeletal:     Cervical back: Neck supple.  Skin:    General: Skin is warm and dry.  Neurological:     Mental Status: She is alert.    Past Medical History:  Diagnosis Date  . Allergy   . Anemia   . Breast cancer ([place]) 2013   left breast, DCIS, radiation  . Hx of migraine headaches   . Personal history of radiation therapy 2013   F/U from lumpectomy for DCIS         Assessment & Plan:  Patient would like to see a different ENT for evaluation of her ongoing left ear issues, on exam today  her external canal and TM appear normal. She will be referred for inner ear/sinus and potential hearing loss with concerns of vertigo related to inner ear.   Advised to continue allergy regimen with Flonase and Zyrtec. OTC decongestant for increases in congestion as needed.   Return to clinic with any acute worsening of symptoms as discussed.

## 2020-03-27 ENCOUNTER — Ambulatory Visit (INDEPENDENT_AMBULATORY_CARE_PROVIDER_SITE_OTHER): Payer: BC Managed Care – PPO | Admitting: Otolaryngology

## 2020-03-27 ENCOUNTER — Other Ambulatory Visit: Payer: Self-pay

## 2020-03-27 ENCOUNTER — Encounter (INDEPENDENT_AMBULATORY_CARE_PROVIDER_SITE_OTHER): Payer: Self-pay | Admitting: Otolaryngology

## 2020-03-27 VITALS — Temp 97.7°F

## 2020-03-27 DIAGNOSIS — J310 Chronic rhinitis: Secondary | ICD-10-CM | POA: Diagnosis not present

## 2020-03-27 DIAGNOSIS — H903 Sensorineural hearing loss, bilateral: Secondary | ICD-10-CM | POA: Diagnosis not present

## 2020-03-27 DIAGNOSIS — R42 Dizziness and giddiness: Secondary | ICD-10-CM

## 2020-03-27 DIAGNOSIS — H6983 Other specified disorders of Eustachian tube, bilateral: Secondary | ICD-10-CM

## 2020-03-27 NOTE — Progress Notes (Signed)
HPI: Linda Tran is a 59 y.o. female who presents is referred by Apolonio Schneiders, FNP for evaluation of hearing loss, tinnitus and vertigo.  She stated that a couple of years ago she had a tooth pulled from the right upper molar region.  She subsequently developed a sinus infection that resulted in the ear problems.  She states that her sinuses stay congested as does her ears.  She also states that she was diagnosed with a Streptococcus ear infection.  Symptoms are generally been worse on the left side.  She complains of tinnitus in the left ear for the past 2 years and at times is off balance although she did really does not describe true spinning or vertigo..  She also complains of loss of taste and smell that comes on and off over the past year. She is presently using Flonase in the mornings.  Past Medical History:  Diagnosis Date  . Allergy   . Anemia   . Breast cancer ([place]) 2013   left breast, DCIS, radiation  . Hx of migraine headaches   . Personal history of radiation therapy 2013   F/U from lumpectomy for DCIS   Past Surgical History:  Procedure Laterality Date  . BREAST BIOPSY Left 01/12/12   positive,DCIS  . BREAST BIOPSY Right 2012   negative  . BREAST BIOPSY Right 2017   2 MM INTRADUCTAL PAPILLOMA.   Marland Kitchen BREAST CYST ASPIRATION Left negative  . BREAST EXCISIONAL BIOPSY Left 01/19/12   positve, DCIS  . BREAST LUMPECTOMY Left 2013   DCIS, lumpectomy F/U radiation   . BREAST SURGERY    . CESAREAN SECTION    . COLONOSCOPY    . POLYPECTOMY     hyperplastic polyps  . TUBAL LIGATION     Social History   Socioeconomic History  . Marital status: Single    Spouse name: Not on file  . Number of children: Not on file  . Years of education: Not on file  . Highest education level: Not on file  Occupational History  . Not on file  Tobacco Use  . Smoking status: Current Every Day Smoker    Packs/day: 1.00    Years: 35.00    Pack years: 35.00    Types: Cigarettes    Start  date: 41  . Smokeless tobacco: Never Used  Vaping Use  . Vaping Use: Never used  Substance and Sexual Activity  . Alcohol use: Yes    Comment: social, occasional  . Drug use: No  . Sexual activity: Not Currently    Birth control/protection: Surgical  Other Topics Concern  . Not on file  Social History Narrative   Marital status: single; not dating since 2012.      Children:  One son; no grandchildren.      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 Arthritis Mother   . Heart disease Father 36       AMI  . Arthritis Sister   . Kidney disease Sister   .  Learning disabilities Sister   . Hypertension Sister   . Hyperlipidemia Sister   . Colon cancer Neg Hx   . Colon polyps Neg Hx   . Stomach cancer Neg Hx   . Rectal cancer Neg Hx   . Esophageal cancer Neg Hx   . Breast cancer Neg Hx    Allergies  Allergen Reactions  . Latex Rash   Prior to Admission medications   Medication Sig Start Date End Date Taking? Authorizing Provider  amoxicillin-clavulanate (AUGMENTIN) 875-125 MG tablet Take 1 tablet by mouth 2 (two) times daily. 01/20/20  Yes Apolonio Schneiders, FNP  aspirin EC 81 MG tablet Take 1 tablet (81 mg total) by mouth daily. 04/20/18  Yes Lesleigh Noe, MD  fluticasone (FLONASE) 50 MCG/ACT nasal spray SPRAY 2 SPRAYS INTO BOTH NOSTRILS DAILY 12/22/19  Yes Lesleigh Noe, MD  methocarbamol (ROBAXIN) 750 MG tablet Take 1 tablet (750 mg total) by mouth 3 (three) times daily. 06/09/19  Yes Lesleigh Noe, MD  Multiple Vitamins-Minerals (WOMENS MULTIVITAMIN PO) Take 1 tablet by mouth daily.   Yes [provider]      Positive ROS: Otherwise negative  All other systems have been reviewed and were otherwise negative with the exception of those mentioned in the HPI and as above.  Physical Exam: Constitutional: Alert, well-appearing, no acute distress Ears: External ears without lesions or tenderness. Ear canals are clear bilaterally.  TMs are clear bilaterally with no middle ear effusion noted.  TMs have reasonable mobility on pneumatic otoscopy.  On Dix-Hallpike testing she had no evidence of BPPV. Nasal: External nose without lesions. Septum with minimal deformity and mild rhinitis.  After decongesting the nose there are no polyps.  Both middle meatus regions are clear with no evidence of mucopurulent discharge.  Mucus within the nasal cavity is clear..  Oral: Lips and gums without lesions. Tongue and palate mucosa without lesions. Posterior oropharynx clear. Neck: No palpable adenopathy or masses Respiratory: Breathing comfortably  Skin: No facial/neck lesions or rash noted.  Audiogram in the office today demonstrated normal hearing in both ears with pure-tone testing normal through all frequencies.  SRT's were 15 dB bilaterally.  She had type C tympanograms bilaterally.  Procedures  Assessment: Normal hearing in both ears. No middle ear effusion or problems noted. She does have rhinitis with perhaps some mild eustachian tube dysfunction.  Plan: Would recommend regular use of Flonase or Nasacort 2 sprays each nostril at night and gave her a prescription to try Nasacort to compare to the Flonase. I discussed with her that there is no evidence of active ear infection or sinus infection. There is no evidence of any inner ear or middle ear significant disease.   Radene Journey, MD   CC:

## 2020-03-31 ENCOUNTER — Other Ambulatory Visit: Payer: Self-pay | Admitting: Family Medicine

## 2020-03-31 DIAGNOSIS — G8929 Other chronic pain: Secondary | ICD-10-CM

## 2020-04-02 NOTE — Telephone Encounter (Signed)
Last office visit 06/09/2019 for CPE.  Last refilled 06/09/2019 for #30 with 3 refills.  No future appointments.

## 2020-04-19 ENCOUNTER — Encounter (INDEPENDENT_AMBULATORY_CARE_PROVIDER_SITE_OTHER): Payer: Self-pay

## 2020-04-23 DIAGNOSIS — Z01419 Encounter for gynecological examination (general) (routine) without abnormal findings: Secondary | ICD-10-CM | POA: Diagnosis not present

## 2020-04-23 DIAGNOSIS — Z124 Encounter for screening for malignant neoplasm of cervix: Secondary | ICD-10-CM | POA: Diagnosis not present

## 2020-04-23 LAB — RESULTS CONSOLE HPV: CHL HPV: NEGATIVE

## 2020-04-23 LAB — HM PAP SMEAR: HM Pap smear: NEGATIVE

## 2020-04-26 ENCOUNTER — Other Ambulatory Visit: Payer: Self-pay | Admitting: Obstetrics & Gynecology

## 2020-04-26 DIAGNOSIS — N6489 Other specified disorders of breast: Secondary | ICD-10-CM

## 2020-04-27 DIAGNOSIS — Z23 Encounter for immunization: Secondary | ICD-10-CM | POA: Diagnosis not present

## 2020-05-29 ENCOUNTER — Encounter: Payer: Self-pay | Admitting: Family Medicine

## 2020-06-18 ENCOUNTER — Ambulatory Visit
Admission: RE | Admit: 2020-06-18 | Discharge: 2020-06-18 | Disposition: A | Discharge status: Home / Self Care | Payer: BC Managed Care – PPO | Source: Ambulatory Visit | Attending: Obstetrics & Gynecology | Admitting: Obstetrics & Gynecology

## 2020-06-18 ENCOUNTER — Other Ambulatory Visit: Payer: Self-pay | Admitting: Obstetrics & Gynecology

## 2020-06-18 ENCOUNTER — Other Ambulatory Visit: Payer: Self-pay

## 2020-06-18 DIAGNOSIS — N6489 Other specified disorders of breast: Secondary | ICD-10-CM

## 2020-06-18 DIAGNOSIS — N6011 Diffuse cystic mastopathy of right breast: Secondary | ICD-10-CM | POA: Diagnosis not present

## 2020-06-18 DIAGNOSIS — R922 Inconclusive mammogram: Secondary | ICD-10-CM | POA: Diagnosis not present

## 2020-06-20 ENCOUNTER — Ambulatory Visit: Payer: 59 | Admitting: Nurse Practitioner

## 2020-06-20 ENCOUNTER — Other Ambulatory Visit: Payer: Self-pay

## 2020-06-20 ENCOUNTER — Encounter: Payer: Self-pay | Admitting: Nurse Practitioner

## 2020-06-20 VITALS — BP 112/78 | HR 98 | Temp 98.0°F | Resp 16

## 2020-06-20 DIAGNOSIS — J301 Allergic rhinitis due to pollen: Secondary | ICD-10-CM

## 2020-06-20 DIAGNOSIS — H1032 Unspecified acute conjunctivitis, left eye: Secondary | ICD-10-CM

## 2020-06-20 DIAGNOSIS — Z760 Encounter for issue of repeat prescription: Secondary | ICD-10-CM

## 2020-06-20 MED ORDER — GENTAMICIN SULFATE 0.3 % OP SOLN: 2.0000 [drp] | OPHTHALMIC | 0 refills | Status: AC

## 2020-06-20 NOTE — Progress Notes (Signed)
   Subjective:    Patient ID: Linda Tran, female    DOB: 06/10/1960, 60 y.o.   MRN: [id number]  HPI  60 year old female presenting with complaints of eye irritation for the past two days, tried allergy eye drops. Woke up this am with her left eye crusted shut.   She does wear contact lenses that she changes monthly.   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   She does have allergies and takes Zyrtec and Flonase daily   She also noted that her left wrist has been swollen since this morning when she took the trash out. Denies limited ROM or weakness.   Today's Vitals   06/20/20 1322  BP: 112/78  Pulse: 98  Resp: 16  Temp: 98 F (36.7 C)  TempSrc: Tympanic  SpO2: 96%   There is no height or weight on file to calculate BMI.      Review of Systems  Constitutional: Negative.   Eyes: Positive for discharge and redness.  Musculoskeletal: Positive for joint swelling.  Neurological: Negative.        Objective:   Physical Exam HENT:     Head: Normocephalic.  Eyes:     General: Lids are normal. Vision grossly intact.     Extraocular Movements: Extraocular movements intact.     Conjunctiva/sclera:     Left eye: Left conjunctiva is injected.     Pupils: Pupils are equal, round, and reactive to light.  Musculoskeletal:     Left wrist: Swelling and tenderness present. No bony tenderness. Normal range of motion.  Neurological:     Mental Status: She is alert.           Assessment & Plan:  Discussed contagious period of bacterial conjunctivitis. Patient to speak to supervisor about continuing on at work or if she needs to take 24 hours off.   Antibiotic eye drops as prescribed use until symptoms resolve or RTC if symptoms change or worsen.  Meds ordered this encounter  Medications  . gentamicin (GARAMYCIN) 0.3 % ophthalmic solution    Sig: Place 2 drops into the left eye every 4 (four) hours for 7 days.  Continue until symptoms resolve. Every 4 hours while awake    Dispense:  5 mL    Refill:  0    Placed medi-wrap to left wrist, patient will take advil and RTC in am if swelling and or pain persists for further evaluation. Asked patient not to report to work in am if she is experiencing symptoms prior to evaluation.   Avoid heavy lifting tonight at home

## 2020-08-30 ENCOUNTER — Ambulatory Visit: Payer: BC Managed Care – PPO

## 2020-08-30 ENCOUNTER — Encounter: Payer: Self-pay | Admitting: Nurse Practitioner

## 2020-08-30 ENCOUNTER — Ambulatory Visit: Payer: BC Managed Care – PPO | Admitting: Nurse Practitioner

## 2020-08-30 ENCOUNTER — Other Ambulatory Visit: Payer: Self-pay

## 2020-08-30 VITALS — BP 118/80 | HR 66 | Temp 99.0°F | Resp 16

## 2020-08-30 DIAGNOSIS — Z20822 Contact with and (suspected) exposure to covid-19: Secondary | ICD-10-CM

## 2020-08-30 DIAGNOSIS — K219 Gastro-esophageal reflux disease without esophagitis: Secondary | ICD-10-CM

## 2020-08-30 LAB — POC COVID19 BINAXNOW: SARS Coronavirus 2 Ag: NEGATIVE

## 2020-08-30 MED ORDER — FAMOTIDINE 20 MG PO TABS: 20.0000 mg | ORAL_TABLET | Freq: Two times a day (BID) | ORAL | 0 refills | Status: DC

## 2020-08-30 NOTE — Progress Notes (Signed)
   Subjective:    Patient ID: Linda Tran, female    DOB: 1960-11-06, 60 y.o.   MRN: [id number]  HPI  60 year old female presenting with symptoms of N/V for the past 24+ hours.  Symptom onset was about 2am 08/29/20 she stopped vomiting around 5pm last night. Has had ongoing diarrhea and stomach cramping.   She was able to eat some rice last night and has had liquids today. She did void this am.   Had body aches and chills with low grade fever as well.   Today is having some acid reflux discomfort.   Today's Vitals   08/30/20 0853  BP: 118/80  Pulse: 66  Resp: 16  Temp: 99 F (37.2 C)  TempSrc: Tympanic  SpO2: 97%   There is no height or weight on file to calculate BMI.  Review of Systems  Constitutional: Positive for fatigue.  HENT: Negative.   Respiratory: Negative.   Gastrointestinal: Positive for diarrhea, nausea and vomiting.  Genitourinary: Negative.   Musculoskeletal: Positive for myalgias.  Skin: Negative.        Objective:   Physical Exam Constitutional:      Appearance: She is ill-appearing.  HENT:     Head: Normocephalic.  Cardiovascular:     Rate and Rhythm: Normal rate and regular rhythm.     Heart sounds: Normal heart sounds.  Pulmonary:     Effort: Pulmonary effort is normal.     Breath sounds: Normal breath sounds.  Abdominal:     General: Abdomen is flat. Bowel sounds are increased.     Palpations: Abdomen is soft.     Tenderness: There is no abdominal tenderness.  Neurological:     Mental Status: She is alert.     Recent Results (from the past 2160 hour(s))  POC COVID-19     Status: Normal   Collection Time: 08/30/20  8:52 AM  Result Value Ref Range   SARS Coronavirus 2 Ag Negative Negative    Comment: Vaccinated 2, boosted x 1, gi symptoms, aware of negative POC results. Has appt with Marigene Ehlers FNP for further eval.        Assessment & Plan:  Likely GI virus Ddx food poisoning.   Continue to push fluids today, Rx for Pepcid as  needed for reflux symptoms BRAT diet as discussed.  Out of work until tomorrow to assure hydration  RTC if symptoms persist or with new concerns   Meds ordered this encounter  Medications  . famotidine (PEPCID) 20 MG tablet    Sig: Take 1 tablet (20 mg total) by mouth 2 (two) times daily.    Dispense:  30 tablet    Refill:  0

## 2020-08-31 ENCOUNTER — Other Ambulatory Visit: Payer: Self-pay | Admitting: Nurse Practitioner

## 2020-08-31 NOTE — Progress Notes (Signed)
Spoke with patient over the phone. She is having more muscle aches related to vomiting. Worse when she uses her arms. Pain is across chest but is not worsened with any activity aside from stretching arms or lifting.   She can take a deep breath without pain. Used Vicks Vapor rub for relief last night  Has been able to eat and drink and nausea has resolved No fever  Voiding clear/yellow  Denies "chest pain" with activity or other systemic symptoms  Advised to rest out today, may use ibuprofen with food and topical muscle cream for relief.   If pain worsens or changes she will RTC or seek medical attention as discussed

## 2020-10-23 ENCOUNTER — Ambulatory Visit: Payer: BC Managed Care – PPO | Admitting: Medical

## 2020-10-23 ENCOUNTER — Other Ambulatory Visit: Payer: Self-pay

## 2020-10-23 ENCOUNTER — Encounter: Payer: Self-pay | Admitting: Medical

## 2020-10-23 VITALS — BP 110/58 | HR 95 | Temp 98.6°F | Resp 18 | Wt 124.4 lb

## 2020-10-23 DIAGNOSIS — L089 Local infection of the skin and subcutaneous tissue, unspecified: Secondary | ICD-10-CM

## 2020-10-23 MED ORDER — AMOXICILLIN-POT CLAVULANATE 875-125 MG PO TABS: 1.0000 | ORAL_TABLET | Freq: Two times a day (BID) | ORAL | 0 refills | Status: DC

## 2020-10-23 NOTE — Patient Instructions (Signed)
Fingertip Infection There are two main types of fingertip infections: Long-term (chronic) or acute paronychia. This is an infection that happens around your nail. This type of infection can start in one nail or occur gradually over time and affect more than one nail. The fingernails that are infected may become thick and deformed. This condition can also happen suddenly (be acute). Felon. This is a bacterial infection in the tip of your finger (pad). A felon infection can cause a painful collection of pus (an abscess) to form inside your fingertip. If the infection is not treated, the infection can spread as deep as the tendon or bone. What are the causes? Paronychia infection can be caused by: Bacteria. Funguses. A mix of both bacteria and funguses. A felon infection is usually caused by the bacteria that are normally found on your skin. An infection can develop if the bacteria spread through your skin tothe pad of tissue inside your fingertip. What increases the risk? You are more likely to develop a fingertip infection if: You have diabetes. You have a weak body's defense system (immune system). You work with your hands. Your hands are exposed to moisture, chemicals, or irritants for long periods of time. You have poor circulation. You bite, chew, or pick your fingernails. What are the signs or symptoms? Symptoms of paronychia infection may affect one or more fingernails and may include: Pain, swelling, and redness around the nail. Pus-filled pockets at the base or side of the fingernail (cuticle). Thick fingernails that separate from the nail bed. Pus that drains from the nail bed. Symptoms of a felon usually affect just one fingertip pad and include: Severe, throbbing pain. Redness. Swelling. Warmth. Tenderness when the affected fingertip is touched. How is this diagnosed? This condition is diagnosed based on: Your medical history. A physical exam. Testing. If there is pus  draining from the infection, it may be swabbed and sent to the lab for a culture. An X-ray. This may be done to see if the infection has spread to the bone. How is this treated? Treatment for a fingertip infection may include: Warm water or salt-water soaks several times per day. Antibiotic medicine. This may be an ointment or pills. Steroid ointment. Antifungal pills. Drainage of pus pockets. This is done by making an incision to open the fingertip to drain pus. Wearing gloves to protect your nails. Follow these instructions at home: Medicines Take or apply over-the-counter and prescription medicines only as told by your health care provider. If you were prescribed an antibiotic medicine, take or apply it as told by your health care provider. Do not stop using the antibiotic even if you start to feel better. Wound care Follow instructions from your health care provider about how to take care of your wound. Make sure you: Wash your hands with soap and water before and after you change your bandage (dressing). If soap and water are not available, use hand sanitizer. Change your dressing as told by your health care provider. Leave stitches (sutures), skin glue, or adhesive strips in place. These skin closures may need to stay in place for 2 weeks or longer. If adhesive strip edges start to loosen and curl up, you may trim the loose edges. Do not remove adhesive strips completely unless your health care provider tells you to do that. Clean the infected area each day with warm water or salt water, or as told by your health care provider. Gently wash the infected area with mild soap and water. Rinse  the infected area with water to remove all soap. Pat the infected area dry with a clean towel. Do not rub it. To make a salt-water mixture, completely dissolve -1 tsp (3-6 g) of salt in 1 cup (237 mL) of warm water. Check the infected area every day for more signs of infection. Watch for: More  redness, swelling, or pain. More fluid or blood. Warmth. A bad smell. Bathing Keep the dressing dry until your health care provider says it can be removed. Ask your health care provider if you may take baths, swim, shower, or use a hot tub. To help prevent spread of the infection, you may only be allowed to take sponge baths. This is rare. Do not let your bandage get wet. Cover it with a watertight covering when you take a bath or shower. General instructions Follow instructions from your health care provider about: How to take care of the infection. When and how you should change your bandage (dressing). When you should remove your dressing. Raise (elevate) the infected area above the level of your heart while you are sitting or lying down or as told by your health care provider. This will help reduce inflammation. Do not scratch or pick at the infected area. Wear gloves as told by your health care provider. Keep all follow-up visits as told by your health care provider. This is important. How is this prevented? Wear gloves when you work with your hands. Wash your hands often with antibacterial soap. Avoid letting your hands stay wet or irritated for long periods of time. Do not bite your fingernails. Do not suck on your fingers. Do not pull on your cuticles. Use clean scissors or nail clippers to trim your nails. Do not cut your fingernails very short. Contact a health care provider if: Your pain medicine is not helping. You have more redness, swelling, or pain at your fingertip. You continue to have fluid, blood, or pus coming from your fingertip. Your infection area feels warm to the touch. You continue to notice a bad smell coming from your fingertip or your dressing. Get help right away if: The area of redness is spreading, or you notice a red streak going away from your fingertip. You have a fever. Summary Paronychia is an infection that happens around your nail. Paronychia  infection can be caused by bacteria, funguses, or a mix of both. A felon infection is usually caused by the bacteria that are normally found on your skin. An infection can develop if the bacteria spread through your skin to the pad of tissue inside your fingertip. Follow instructions from your health care provider about how to take care of the infection. Take or apply over-the-counter and prescription medicines only as told by your health care provider. Contact a health care provider if you have more drainage, redness, swelling, or pain at your fingertip. This information is not intended to replace advice given to you by your health care provider. Make sure you discuss any questions you have with your healthcare provider. Document Revised: 12/29/2017 Document Reviewed: 12/29/2017 Elsevier Patient Education  [place].

## 2020-10-23 NOTE — Progress Notes (Signed)
Work note completed and form printed and signed by patient.  R.N. [facility] for [place]. 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 Notified:N  Date of Injury:Patient cannot recall Time of Injury:Patient cannot recall Shift Worked:[employer] where injury occurred (address or landmark):Patient cannot recall  Body Part Injured:Right middle finger  Vital Signs BP 110/58 HR 112 RR 18 SPO2 95 T 98.6  Weight: 124.4 lbs LMP 11/01/2015  Menopause   Injury Description Right middle finger  Patient reports being at work and opening a cabinet or drawer in the kitchen and it ripping part of her fingernail   Provider Note Patient states that when opening the cabinet it chipped her nail, she later nail filed the area to smooth out the nail.  Started turning red and swelling 7 days ag on  the medial side of  3rd right finger. No discharge per patient. She also states sometimes it turns purple or red and /or green at the edge of the side of nail and finger.   NAD, Full range of motion  of  right 3rd finger. Erythema on the medial side of finger and at finger tip, swollen compared to left 3rd finger. It now is tender to touch and rednesss is on the volar surface of the finger. C/o tingling on pad of finger and decreased sensation on the tip of the finger. Less then 2 cap refill. Finger warm to the touch. Skin intact, nothing to incision or drain.    Diagnosis  Infected 3rd right finger  Medications Prescribed  Augmentin Meds ordered this encounter  Medications   amoxicillin-clavulanate (AUGMENTIN) 875-125 MG tablet    Sig: Take 1 tablet by mouth 2 (two) times daily.    Dispense:  20 tablet    Refill:  0    Soak  2-3 times day for  15 minutes in dilute salt water or epsom salt.  Ibuprofen 400mg  every  6 hours as needed for pain.   Referred to Return to clinic in  3 days for a recheck. Sooner if worsening.   Return to Work Status See sheet. 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   Provider Signature ________________________________________Date_________   Employee Signature _______________________________________Date_________   Please email this completed form to Ciro Backer, Director of Risk Management at [email or web address] within 24 hours of visit.

## 2020-10-26 ENCOUNTER — Ambulatory Visit: Payer: BC Managed Care – PPO | Admitting: Nurse Practitioner

## 2020-10-26 ENCOUNTER — Other Ambulatory Visit: Payer: Self-pay

## 2020-10-26 VITALS — BP 113/55 | HR 85 | Temp 97.3°F | Wt 120.4 lb

## 2020-10-26 DIAGNOSIS — L089 Local infection of the skin and subcutaneous tissue, unspecified: Secondary | ICD-10-CM

## 2020-10-26 NOTE — Progress Notes (Signed)
R.N. [facility] for [place]. 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 She has just taken two full days of Antibiotics, and is due for 3rd days dose now.  She reports her finger is not better, but also not worse.  The purple hue has mostly resolved.  It remains tender to touch on volar surface.  Skin remains intact, and feels hard with palpation.  Does not appear to be good candidate for Incision or Drain at this time.    Diagnosis  Infected 3rd Right finger.  Medications Prescribed  No orders of the defined types were placed in this encounter.   Referred to Recheck in clinic on Monday 10/29/2020    Return to Work Status May return to work with same recommendations.  Do not get finger wet, wear gloves and change often. Limited use of finger secondary to pain.    Provider Signature ________________________________________Date_________   Employee Signature _______________________________________Date_________   Please email this completed form to Ciro Backer, Director of Risk Management at [email or web address] within 24 hours of visit.

## 2020-10-29 ENCOUNTER — Other Ambulatory Visit: Payer: Self-pay

## 2020-10-29 ENCOUNTER — Ambulatory Visit: Payer: BC Managed Care – PPO | Admitting: Medical

## 2020-10-29 ENCOUNTER — Encounter: Payer: Self-pay | Admitting: Medical

## 2020-10-29 VITALS — BP 120/72 | HR 107 | Temp 97.6°F | Resp 16

## 2020-10-29 DIAGNOSIS — L089 Local infection of the skin and subcutaneous tissue, unspecified: Secondary | ICD-10-CM

## 2020-10-29 DIAGNOSIS — Z026 Encounter for examination for insurance purposes: Secondary | ICD-10-CM

## 2020-10-29 MED ORDER — SULFAMETHOXAZOLE-TRIMETHOPRIM 800-160 MG PO TABS: 1.0000 | ORAL_TABLET | Freq: Two times a day (BID) | ORAL | 0 refills | Status: DC

## 2020-10-29 NOTE — Progress Notes (Deleted)
   Subjective:    Patient ID: Linda Tran, female    DOB: 12-01-1960, 60 y.o.   MRN: [id number]  HPI    Review of Systems     Objective:   Physical Exam        Assessment & Plan:

## 2020-10-29 NOTE — Progress Notes (Signed)
[facility] for [place]. [address]  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 Finger is right  3rd ( middle ) finger.    Provider Note Last visit  right  third finger was more red and swollen then today it is today. Patient was placed on Augmentin . She has FROM, 2+ CR, mild erythema medially and volar surface of finger.  Nothing to I&D. Still has a purplish hue to the medial side of finger. Mild sloughing of skin on the medial side of finger. Patient is  a  smokker  1 ppd this may be slowing the healing process.    Diagnosis  Right 3rd finger infection distally and medially.  Apolonio Schneiders FNP was in clinic and did see finger, so she can tell on follow up if finger is improivng.    Medications Prescribed  Meds ordered this encounter  Medications   sulfamethoxazole-trimethoprim (BACTRIM DS) 800-160 MG tablet    Sig: Take 1 tablet by mouth 2 (two) times daily.    Dispense:  20 tablet    Refill:  0    Referred to  Return on Thursday for follow up.   Return to Work Status May return to work with same recommendations.  Do not get finger wet, wear gloves and change often. Limited use of finger secondary to pain.   Provider  Signature ________________________________________Date_________   Employee Signature _______________________________________Date_________   Please email this completed form to Ciro Backer, Director of Risk Management at [email or web address] within 24 hours of visit.

## 2020-11-01 ENCOUNTER — Ambulatory Visit: Payer: BC Managed Care – PPO | Admitting: Nurse Practitioner

## 2020-11-01 ENCOUNTER — Encounter: Payer: Self-pay | Admitting: Nurse Practitioner

## 2020-11-01 ENCOUNTER — Other Ambulatory Visit: Payer: Self-pay

## 2020-11-01 VITALS — BP 116/74 | HR 78 | Temp 98.7°F | Wt 124.0 lb

## 2020-11-01 DIAGNOSIS — L089 Local infection of the skin and subcutaneous tissue, unspecified: Secondary | ICD-10-CM

## 2020-11-01 MED ORDER — MUPIROCIN CALCIUM 2 % EX CREA: 1.0000 "application " | TOPICAL_CREAM | Freq: Two times a day (BID) | CUTANEOUS | 0 refills | Status: DC

## 2020-11-01 NOTE — Progress Notes (Signed)
[facility] [address]  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   Injury Description Injury Description  Nail chipped opening a cupboard follow up appointment. Finger is right  3rd ( middle ) finger. See prior note    Provider Note 3rd digit right hand with hardened skin just distal to nail med medially. Non tender to touch. Surrounding skin distal to joint erythematous. Sensation intact. Cap refill < 2 seconds. No active drainage. Full ROM of finger. Was given Bactrim at last visit and caused GI upset after 2 doses so patient stopped.    Diagnosis  1. Skin infection - mupirocin cream (BACTROBAN) 2 %; Apply 1 application topically 2 (two) times daily.  Dispense: 15 g; Refill: 0    Keep covered with bandage and ointment during the day, open to air at night. Follow up Monday earlier with any worsening symptoms.     Return to Work Status May return to work with same recommendations.  Do not get finger wet, wear gloves and change often. Limited use of finger secondary to pain.   Provider Signature ________________________________________Date_________   Employee Signature _______________________________________Date_________   Please email this completed form to Ciro Backer,  Director of Risk Management at [email or web address] within 24 hours of visit.

## 2020-11-05 ENCOUNTER — Ambulatory Visit: Payer: BC Managed Care – PPO | Admitting: Medical

## 2020-11-05 ENCOUNTER — Other Ambulatory Visit: Payer: Self-pay

## 2020-11-05 ENCOUNTER — Encounter: Payer: Self-pay | Admitting: Medical

## 2020-11-05 VITALS — BP 120/66 | HR 100 | Temp 98.2°F | Resp 18

## 2020-11-05 DIAGNOSIS — L089 Local infection of the skin and subcutaneous tissue, unspecified: Secondary | ICD-10-CM

## 2020-11-05 DIAGNOSIS — Z026 Encounter for examination for insurance purposes: Secondary | ICD-10-CM

## 2020-11-05 NOTE — Progress Notes (Signed)
[facility] [address]  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 rate 18, last menstrual period 11/01/2015, SpO2 96 %.   LMP 11/01/2015   Injury Description  Nail chipped opening a cupboard follow up appointment. Finger is right  3rd ( middle ) finger.   Provider Note Patient returns for a recheck of  right middle finger (3rd finger). She has been on antibiotics Improving with bactroban.  Took bactim and got an itchy rash on abdomen so she stopped the antibiotics.  Diagnosis  Right finger infection improving,  <2s CR  2+ pulse radial, patient states no pain or tenderness now. FROM. Mild erythema noted on medial side.Skin sloughing off mildly. Swelling resolved.   Medications Prescribed  No orders of the defined types were placed in this encounter.   Referred to ESW for next appointment    Return to Work Status No vibration equipment. To keep dry.   Provider Signature ________________________________________Date_________   Employee Signature _______________________________________Date_________   Please email this completed form to Ciro Backer, Director of Risk Management at [email or web address] within 24 hours of visit.

## 2020-11-12 ENCOUNTER — Other Ambulatory Visit: Payer: Self-pay

## 2020-11-12 ENCOUNTER — Ambulatory Visit
Admission: RE | Admit: 2020-11-12 | Discharge: 2020-11-12 | Disposition: A | Discharge status: Home / Self Care | Payer: No Typology Code available for payment source | Attending: Medical | Admitting: Medical

## 2020-11-12 ENCOUNTER — Ambulatory Visit: Payer: BC Managed Care – PPO | Admitting: Medical

## 2020-11-12 ENCOUNTER — Encounter: Payer: Self-pay | Admitting: Medical

## 2020-11-12 ENCOUNTER — Ambulatory Visit
Admission: RE | Admit: 2020-11-12 | Discharge: 2020-11-12 | Disposition: A | Discharge status: Home / Self Care | Payer: No Typology Code available for payment source | Source: Ambulatory Visit | Attending: Medical | Admitting: Medical

## 2020-11-12 VITALS — BP 98/62 | HR 99 | Temp 98.2°F | Resp 18

## 2020-11-12 DIAGNOSIS — M79644 Pain in right finger(s): Secondary | ICD-10-CM

## 2020-11-12 DIAGNOSIS — L089 Local infection of the skin and subcutaneous tissue, unspecified: Secondary | ICD-10-CM | POA: Insufficient documentation

## 2020-11-12 DIAGNOSIS — R350 Frequency of micturition: Secondary | ICD-10-CM

## 2020-11-12 DIAGNOSIS — N3001 Acute cystitis with hematuria: Secondary | ICD-10-CM

## 2020-11-12 LAB — POCT URINALYSIS DIPSTICK
Bilirubin, UA: NEGATIVE
Glucose, UA: NEGATIVE
Ketones, UA: NEGATIVE
Nitrite, UA: POSITIVE
Protein, UA: POSITIVE — AB
Spec Grav, UA: 1.02 (ref 1.010–1.025)
Urobilinogen, UA: 0.2 E.U./dL
pH, UA: 6 (ref 5.0–8.0)

## 2020-11-12 MED ORDER — AZITHROMYCIN 250 MG PO TABS: ORAL_TABLET | ORAL | 0 refills | Status: DC

## 2020-11-12 MED ORDER — CIPROFLOXACIN HCL 500 MG PO TABS: 500.0000 mg | ORAL_TABLET | Freq: Two times a day (BID) | ORAL | 0 refills | Status: DC

## 2020-11-12 NOTE — Progress Notes (Signed)
[facility] [address]  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 Finger is right  3rd ( middle ) finger.    Provider Note Initially patient told me she was opening a cabinet and hit her nail. Today she says she was tilting a drawer and putting it back into the chest and her finger was underneath the drawer and the drawer fell and her finger was caught underneath the drawer and drawer opening. NAD today Tender if palpated on the medial side of nail. Redness is almost gone. Mild swelling. No discharge, skin healed., no sloughing.   Diagnosis  Right 3rd finger (middle) pain. Resolving infection.  Medications Prescribed  No orders of the defined types were placed in this encounter.   Referred to:  x-ray of finger right 3rd, will call patient with results and decide plan at that time.    Return to Work Status May return to work use of finger as tolerated.   Provider Signature ________________________________________Date_________   Employee Signature _______________________________________Date_________   Please email this completed form to Ciro Backer, Director of Risk Management at [email or web address] within 24 hours of visit.

## 2020-11-12 NOTE — Progress Notes (Signed)
   Subjective:    Patient ID: Linda Tran, female    DOB: 12-07-1960, 60 y.o.   MRN: [id number]  HPI 60 yo female in non acute distress. Started with  symptoms last Wednesday. With burning, frequency and urgency on urination.. Used AZO  Thursday through Sunday. Denies fever or chills.  No back pain.   Review of Systems  Constitutional:  Negative for chills and fever.  Gastrointestinal:  Positive for abdominal pain.  Genitourinary:  Positive for flank pain, frequency and urgency. Negative for difficulty urinating, hematuria and vaginal discharge.  Musculoskeletal:  Positive for back pain (left side lower back , a sensation, sherp, on and off pain.).  Neurological:  Negative for dizziness, syncope and headaches.     doing Metamucil challenge taking tablet daily x  14 days. Objective:   Physical Exam Vitals and nursing note reviewed.  Constitutional:      Appearance: Normal appearance.  Pulmonary:     Effort: Pulmonary effort is normal.  Abdominal:     General: There is no distension.     Palpations: Abdomen is soft.     Tenderness: There is no abdominal tenderness. There is no right CVA tenderness, left CVA tenderness or guarding.  Skin:    General: Skin is warm and dry.  Neurological:     General: No focal deficit present.     Mental Status: She is alert and oriented to person, place, and time. Mental status is at baseline.  Psychiatric:        Mood and Affect: Mood normal.        Behavior: Behavior normal.        Thought Content: Thought content normal.        Judgment: Judgment normal.   NAD  No CVA tenderness   Results for orders placed or performed in visit on 11/12/20 (from the past 24 hour(s))  POCT urinalysis dipstick     Status: Abnormal   Collection Time: 11/12/20  9:54 AM  Result Value Ref Range   Color, UA yellow    Clarity, UA cloudy    Glucose, UA Negative Negative   Bilirubin, UA Negative    Ketones, UA Negative    Spec Grav, UA 1.020 1.010 -  1.025   Blood, UA Large    pH, UA 6.0 5.0 - 8.0   Protein, UA Positive (A) Negative   Urobilinogen, UA 0.2 0.2 or 1.0 E.U./dL   Nitrite, UA Positive    Leukocytes, UA Moderate (2+) (A) Negative   Appearance     Odor          Assessment & Plan:  UTI  with hematuria Sent to [facility] Meds ordered this encounter  Medications   ciprofloxacin (CIPRO) 500 MG tablet    Sig: Take 1 tablet (500 mg total) by mouth 2 (two) times daily.    Dispense:  14 tablet    Refill:  0   Take no more then  2 days of AZO per package insturctions. Drink plenty of water. I will call patient with lab results. Patient verbalizes understanding and has no questions at discharge.

## 2020-11-12 NOTE — Progress Notes (Signed)
  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 All correspondence related to the matter should be copied to Ciro Backer, Director of Risk Management, at [email or web address].  Please e-mail this completed form to Ciro Backer, Director of Risk Management, at [email or web address] within 24 hours of visit.

## 2020-11-13 ENCOUNTER — Telehealth: Payer: Self-pay | Admitting: Medical

## 2020-11-13 NOTE — Telephone Encounter (Signed)
left message normal x-ray of finger, To follow up in 2 weeks to ensure finger continues to improve. Left office number and my name.

## 2020-11-14 LAB — URINE CULTURE

## 2020-11-26 ENCOUNTER — Other Ambulatory Visit: Payer: Self-pay

## 2020-11-26 ENCOUNTER — Ambulatory Visit: Payer: BC Managed Care – PPO | Admitting: Medical

## 2020-11-26 ENCOUNTER — Encounter: Payer: Self-pay | Admitting: Medical

## 2020-11-26 VITALS — BP 117/63 | HR 78 | Temp 98.2°F | Resp 16 | Ht 62.0 in | Wt 124.4 lb

## 2020-11-26 DIAGNOSIS — Z0189 Encounter for other specified special examinations: Secondary | ICD-10-CM

## 2020-11-26 NOTE — Progress Notes (Signed)
Patient ID: Linda Tran, female   DOB: [birth year], 60 y.o.   MRN: [id number]  [facility] 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 Supervisor Notified:N  Date of Injury:patient can not recall Time of Injury:patient can not recall Shift Worked:[place] Location where injury occurred (address or landmark):patient can not recall  Body Part Injured: Right middle finger  Vital Signs LMP 11/01/2015  Weight 124 Ht--5 2 B/P--117/63 Pulse-78 Temp--98.2 F SpO2--98 %  Injury Description  Nail chipped opening a cupboard follow up appointment. Finger is right  3rd ( middle ) finger.     Provider Note Finger wnl, full ROM, non tender, no erythema.     DiagnosisFinger infection  3rd finger right hand resolved.  Medications Prescribed  No orders of the defined types were placed in this encounter.   Referred to Patient released from my care, may return to work full duty.  Ordered yearly labs for PCP.  Return to Work Status Full duty   Building control surveyor ________________________________________Date_________   Academic librarian _______________________________________Date_________   Please email this completed form to Ciro Backer, Director of Risk Management at [email or web address] within 24 hours of visit.

## 2020-11-27 ENCOUNTER — Other Ambulatory Visit: Payer: BC Managed Care – PPO

## 2020-11-27 DIAGNOSIS — Z0189 Encounter for other specified special examinations: Secondary | ICD-10-CM

## 2020-11-27 LAB — POCT URINALYSIS DIPSTICK
Bilirubin, UA: NEGATIVE
Blood, UA: NEGATIVE
Glucose, UA: NEGATIVE
Ketones, UA: NEGATIVE
Leukocytes, UA: NEGATIVE
Nitrite, UA: NEGATIVE
Protein, UA: NEGATIVE
Spec Grav, UA: 1.01 (ref 1.010–1.025)
Urobilinogen, UA: 0.2 E.U./dL
pH, UA: 6 (ref 5.0–8.0)

## 2020-11-28 LAB — CMP12+LP+TP+TSH+6AC+CBC/D/PLT
ALT: 10 IU/L (ref 0–32)
AST: 14 IU/L (ref 0–40)
Albumin/Globulin Ratio: 2.1 (ref 1.2–2.2)
Albumin: 4.6 g/dL (ref 3.8–4.9)
Alkaline Phosphatase: 94 IU/L (ref 44–121)
BUN/Creatinine Ratio: 22 (ref 9–23)
BUN: 11 mg/dL (ref 6–24)
Basophils Absolute: 0.1 10*3/uL (ref 0.0–0.2)
Basos: 2 %
Bilirubin Total: <0.2 mg/dL (ref 0.0–1.2)
Calcium: 9.1 mg/dL (ref 8.7–10.2)
Chloride: 102 mmol/L (ref 96–106)
Chol/HDL Ratio: 1.9 ratio (ref 0.0–4.4)
Cholesterol, Total: 258 mg/dL — ABNORMAL HIGH (ref 100–199)
Creatinine, Ser: 0.5 mg/dL — ABNORMAL LOW (ref 0.57–1.00)
EOS (ABSOLUTE): 0.2 10*3/uL (ref 0.0–0.4)
Eos: 3 %
Estimated CHD Risk: <0.5 times avg. (ref 0.0–1.0)
Free Thyroxine Index: 2.1 (ref 1.2–4.9)
GGT: 10 IU/L (ref 0–60)
Globulin, Total: 2.2 g/dL (ref 1.5–4.5)
Glucose: 98 mg/dL (ref 65–99)
HDL: 139 mg/dL (ref >39)
Hematocrit: 36.6 % (ref 34.0–46.6)
Hemoglobin: 10.7 g/dL — ABNORMAL LOW (ref 11.1–15.9)
Immature Grans (Abs): 0 10*3/uL (ref 0.0–0.1)
Immature Granulocytes: 0 %
Iron: 18 ug/dL — ABNORMAL LOW (ref 27–159)
LDH: 144 IU/L (ref 119–226)
LDL Chol Calc (NIH): 91 mg/dL (ref 0–99)
Lymphocytes Absolute: 2 10*3/uL (ref 0.7–3.1)
Lymphs: 33 %
MCH: 21 pg — ABNORMAL LOW (ref 26.6–33.0)
MCHC: 29.2 g/dL — ABNORMAL LOW (ref 31.5–35.7)
MCV: 72 fL — ABNORMAL LOW (ref 79–97)
Monocytes Absolute: 0.4 10*3/uL (ref 0.1–0.9)
Monocytes: 7 %
Neutrophils Absolute: 3.4 10*3/uL (ref 1.4–7.0)
Neutrophils: 55 %
Phosphorus: 4 mg/dL (ref 3.0–4.3)
Platelets: 396 10*3/uL (ref 150–450)
Potassium: 4.3 mmol/L (ref 3.5–5.2)
RBC: 5.09 x10E6/uL (ref 3.77–5.28)
RDW: 20.1 % — ABNORMAL HIGH (ref 11.7–15.4)
Sodium: 137 mmol/L (ref 134–144)
T3 Uptake Ratio: 23 % — ABNORMAL LOW (ref 24–39)
T4, Total: 9 ug/dL (ref 4.5–12.0)
TSH: 1.16 u[IU]/mL (ref 0.450–4.500)
Total Protein: 6.8 g/dL (ref 6.0–8.5)
Triglycerides: 174 mg/dL — ABNORMAL HIGH (ref 0–149)
Uric Acid: 2.7 mg/dL — ABNORMAL LOW (ref 3.0–7.2)
VLDL Cholesterol Cal: 28 mg/dL (ref 5–40)
WBC: 6.1 10*3/uL (ref 3.4–10.8)
eGFR: 109 mL/min/{1.73_m2} (ref >59)

## 2020-11-28 LAB — HGB A1C W/O EAG: Hgb A1c MFr Bld: 6 % — ABNORMAL HIGH (ref 4.8–5.6)

## 2020-11-28 LAB — VITAMIN D 25 HYDROXY (VIT D DEFICIENCY, FRACTURES): Vit D, 25-Hydroxy: 39.9 ng/mL (ref 30.0–100.0)

## 2020-11-28 NOTE — Progress Notes (Signed)
Called pt to schedule appt. Pt did not answer so I left a voicemail. 

## 2020-11-29 ENCOUNTER — Ambulatory Visit: Payer: BC Managed Care – PPO | Admitting: Nurse Practitioner

## 2020-11-29 ENCOUNTER — Other Ambulatory Visit: Payer: Self-pay

## 2020-11-29 DIAGNOSIS — R899 Unspecified abnormal finding in specimens from other organs, systems and tissues: Secondary | ICD-10-CM

## 2020-11-29 NOTE — Progress Notes (Signed)
Patient was seen her earlier this week for lab draw and wanted to review results.   Reviewed abnormal results with patient including increased cholesterol and advised patient to schedule follow up with PCP- lab results forwarded to PCP as well.   Patient is agreeable and will schedule follow up and RTC as needed.

## 2020-12-06 ENCOUNTER — Encounter: Payer: Self-pay | Admitting: Family Medicine

## 2020-12-06 ENCOUNTER — Ambulatory Visit (INDEPENDENT_AMBULATORY_CARE_PROVIDER_SITE_OTHER): Payer: BC Managed Care – PPO | Admitting: Family Medicine

## 2020-12-06 ENCOUNTER — Other Ambulatory Visit: Payer: Self-pay | Admitting: Family Medicine

## 2020-12-06 ENCOUNTER — Other Ambulatory Visit: Payer: Self-pay

## 2020-12-06 VITALS — BP 110/78 | HR 96 | Temp 97.8°F | Ht 61.1 in | Wt 122.8 lb

## 2020-12-06 DIAGNOSIS — Z720 Tobacco use: Secondary | ICD-10-CM

## 2020-12-06 DIAGNOSIS — Z0000 Encounter for general adult medical examination without abnormal findings: Secondary | ICD-10-CM | POA: Diagnosis not present

## 2020-12-06 DIAGNOSIS — K629 Disease of anus and rectum, unspecified: Secondary | ICD-10-CM

## 2020-12-06 DIAGNOSIS — D509 Iron deficiency anemia, unspecified: Secondary | ICD-10-CM

## 2020-12-06 DIAGNOSIS — Z23 Encounter for immunization: Secondary | ICD-10-CM

## 2020-12-06 LAB — IBC PANEL
Iron: 20 ug/dL — ABNORMAL LOW (ref 42–145)
Saturation Ratios: 4.2 % — ABNORMAL LOW (ref 20.0–50.0)
TIBC: 481.6 ug/dL — ABNORMAL HIGH (ref 250.0–450.0)
Transferrin: 344 mg/dL (ref 212.0–360.0)

## 2020-12-06 LAB — FERRITIN: Ferritin: 11.3 ng/mL (ref 10.0–291.0)

## 2020-12-06 NOTE — Patient Instructions (Addendum)
#   High cholesterol - Quit Smoking - Exercise 30 minutes, 5 days per week - Whole grains and lean meats  Consider scheduling nurse visit for  - Pneumonia Vaccine  - Shingles vaccine   Continue iron Fish oil is good as well  #Referral I have placed a referral to a specialist for you. You should receive a phone call from the specialty office. Make sure your voicemail is not full and that if you are able to answer your phone to unknown or new numbers.   It may take up to 2 weeks to hear about the referral. If you do not hear anything in 2 weeks, please call our office and ask to speak with the referral coordinator.

## 2020-12-06 NOTE — Progress Notes (Signed)
Annual Exam   Chief Complaint:  Chief Complaint  Patient presents with   Annual Exam    Go over lab work     History of Present Illness:  [name] is a 60 y.o. G1P1001 who LMP was Patient's last menstrual period was 11/01/2015., presents today for her annual examination.     Nutrition She does get adequate calcium and Vitamin D in her diet. Diet: generally healthy Exercise: walking the dogs after work, has an active job  Safety The patient wears seatbelts: yes.     The patient feels safe at home and in their relationships: yes.   Menstrual:  Symptoms of menopause: not currently   GYN She is not sexually active.    Cervical Cancer Screening (21-65):   Last Pap:   January 2022 Results were: no abnormalities /neg HPV DNA   Breast Cancer Screening (Age 42-74):   Last Mammogram: 06/2020 The patient does want a mammogram this year.    Colon Cancer Screening:  Age 32-75 yo - benefits outweigh the risk. Adults 70-85 yo who have never been screened benefit.  Benefits: 134000 people in 2016 will be diagnosed and 49,000 will die - early detection helps [place]: Complications 2/2 to colonoscopy High Risk (Colonoscopy): genetic disorder (Lynch syndrome or familial adenomatous polyposis), personal hx of IBD, previous adenomatous polyp, or previous colorectal cancer, FamHx start 10 years before the age at diagnosis, increased in males and black race  Options:  FIT - looks for hemoglobin (blood in the stool) - specific and fairly sensitive - must be done annually Cologuard - looks for DNA and blood - more sensitive - therefore can have more false positives, every 3 years Colonoscopy - every 10 years if normal - sedation, bowl prep, must have someone drive you  Shared decision making and the patient had decided to do colonoscopy - due in 5 years 2023.   Social History   Tobacco Use  Smoking Status Every Day   Packs/day: 1.00   Years: 35.00   Pack years: 35.00    Types: Cigarettes   Start date: 17  Smokeless Tobacco Never    Lung Cancer Screening (Ages 33-80): yes 20 year pack history? Yes Current Tobacco user? Yes Quit less than 15 years ago? Yes Interested in low dose CT for lung cancer screening? yes  Weight Wt Readings from Last 3 Encounters:  12/06/20 122 lb 12 oz (55.7 kg)  11/26/20 124 lb 6.4 oz (56.4 kg)  11/01/20 124 lb (56.2 kg)   Patient has normal BMI  BMI Readings from Last 1 Encounters:  12/06/20 23.12 kg/m     Chronic disease screening Blood pressure monitoring:  BP Readings from Last 3 Encounters:  12/06/20 110/78  11/26/20 117/63  11/12/20 98/62    Lipid Monitoring: Indication for screening: age >80, obesity, diabetes, family hx, CV risk factors.  Lipid screening: Not Indicated  Lab Results  Component Value Date   CHOL 258 (H) 11/27/2020   HDL 139 11/27/2020   Startex 91 11/27/2020   LDLDIRECT 135.0 06/09/2019   TRIG 174 (H) 11/27/2020   CHOLHDL 1.9 11/27/2020     Diabetes Screening: age >80, overweight, family hx, PCOS, hx of gestational diabetes, at risk ethnicity Diabetes Screening screening: Not Indicated  Lab Results  Component Value Date   HGBA1C 6.0 (H) 11/27/2020     Past Medical History:  Diagnosis Date   Allergy    Anemia    Breast cancer ([place]) 2013   left breast,  DCIS, radiation   Hx of migraine headaches    Personal history of radiation therapy 2013   F/U from lumpectomy for DCIS    Past Surgical History:  Procedure Laterality Date   BREAST BIOPSY Left 01/12/12   positive,DCIS   BREAST BIOPSY Right 2012   negative   BREAST BIOPSY Right 2017   2 MM INTRADUCTAL PAPILLOMA.    BREAST CYST ASPIRATION Left negative   BREAST EXCISIONAL BIOPSY Left 01/19/12   positve, DCIS   BREAST LUMPECTOMY Left 2013   DCIS, lumpectomy F/U radiation    BREAST SURGERY     CESAREAN SECTION     COLONOSCOPY     POLYPECTOMY     hyperplastic polyps   TUBAL LIGATION      Prior to Admission  medications   Medication Sig Start Date End Date Taking? Authorizing Provider  cetirizine (ZYRTEC) 10 MG chewable tablet Chew 10 mg by mouth daily.   Yes [provider]  Ferrous Sulfate (IRON) 325 (65 Fe) MG TABS Take 1 tablet by mouth daily at 12 noon.   Yes [provider]  methocarbamol (ROBAXIN) 750 MG tablet TAKE 1 TABLET BY MOUTH 3 TIMES DAILY Patient taking differently: Take 750 mg by mouth as needed. 04/02/20  Yes Lesleigh Noe, MD  Multiple Vitamin (MULTIVITAMIN ADULT PO) multivitamin   Yes [provider]  Multiple Vitamins-Minerals (WOMENS MULTIVITAMIN PO) Take 1 tablet by mouth daily.   Yes [provider]  Omega-3 Fatty Acids (FISH OIL PO) Take 1,400 mg by mouth.   Yes [provider]    Allergies  Allergen Reactions   Bactrim [Sulfamethoxazole-Trimethoprim] Rash    Rash on abdomen   Latex Rash    Gynecologic History: Patient's last menstrual period was 11/01/2015.  Obstetric History: G1P1001  Social History   Socioeconomic History   Marital status: Single    Spouse name: Not on file   Number of children: Not on file   Years of education: Not on file   Highest education level: Not on file  Occupational History   Not on file  Tobacco Use   Smoking status: Every Day    Packs/day: 1.00    Years: 35.00    Pack years: 35.00    Types: Cigarettes    Start date: 70   Smokeless tobacco: Never  Vaping Use   Vaping Use: Never used  Substance and Sexual Activity   Alcohol use: Yes    Comment: social, occasional   Drug use: No   Sexual activity: Not Currently    Birth control/protection: Surgical  Other Topics Concern   Not on file  Social History Narrative   Marital status: single; not dating since 2012.      Children:  One son; no grandchildren.      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      Tobacco: 1 ppd      Alcohol:  Socially   Support - nobody   Stress: smoking         Social Determinants of Adult nurse Strain: Not on file  Food Insecurity: Not on file  Transportation Needs: Not on file  Physical Activity: Not on file  Stress: Not on file  Social Connections: Not on file  Intimate Partner Violence: Not on file    Family History  Problem Relation Age of Onset   Arthritis Mother    Heart disease Father 21       AMI   Arthritis Sister  Kidney disease Sister    Learning disabilities Sister    Hypertension Sister    Hyperlipidemia Sister    Colon cancer Neg Hx    Colon polyps Neg Hx    Stomach cancer Neg Hx    Rectal cancer Neg Hx    Esophageal cancer Neg Hx    Breast cancer Neg Hx     Review of Systems  Constitutional:  Negative for chills, fever and malaise/fatigue.  HENT:  Negative for congestion and sore throat.   Eyes:  Negative for blurred vision and double vision.  Respiratory:  Negative for shortness of breath.   Cardiovascular:  Negative for chest pain.  Gastrointestinal:  Negative for blood in stool, heartburn, nausea and vomiting.  Genitourinary: Negative.  Negative for hematuria.  Musculoskeletal: Negative.  Negative for myalgias.  Skin:  Negative for rash.  Neurological:  Negative for dizziness and headaches.  Endo/Heme/Allergies:  Does not bruise/bleed easily.  Psychiatric/Behavioral:  Negative for depression. The patient is not nervous/anxious.     Physical Exam BP 110/78   Pulse 96   Temp 97.8 F (36.6 C) (Temporal)   Ht 5' 1.1" (1.552 m)   Wt 122 lb 12 oz (55.7 kg)   LMP 11/01/2015   SpO2 97%   BMI 23.12 kg/m    BP Readings from Last 3 Encounters:  12/06/20 110/78  11/26/20 117/63  11/12/20 98/62      Physical Exam Constitutional:      General: She is not in acute distress.    Appearance: She is well-developed. She is not diaphoretic.  HENT:     Head: Normocephalic and atraumatic.     Right Ear: External ear normal.     Left Ear: External ear normal.     Nose: Nose normal.  Eyes:     General: No scleral  icterus.    Extraocular Movements: Extraocular movements intact.     Conjunctiva/sclera: Conjunctivae normal.  Cardiovascular:     Rate and Rhythm: Normal rate and regular rhythm.     Heart sounds: No murmur heard. Pulmonary:     Effort: Pulmonary effort is normal. No respiratory distress.     Breath sounds: Normal breath sounds. No wheezing.  Abdominal:     General: Bowel sounds are normal. There is no distension.     Palpations: Abdomen is soft. There is no mass.     Tenderness: There is no abdominal tenderness. There is no guarding or rebound.  Musculoskeletal:        General: Normal range of motion.     Cervical back: Neck supple.  Lymphadenopathy:     Cervical: No cervical adenopathy.  Skin:    General: Skin is warm and dry.     Capillary Refill: Capillary refill takes less than 2 seconds.  Neurological:     Mental Status: She is alert and oriented to person, place, and time.     Deep Tendon Reflexes: Reflexes normal.  Psychiatric:        Mood and Affect: Mood normal.        Behavior: Behavior normal.    Results:  PHQ-9:  Depression screen [facility] 2/9 06/09/2019  Decreased Interest 0  Down, Depressed, Hopeless 0  PHQ - 2 Score 0       Assessment: 60 y.o. G31P1001 female here for routine annual physical examination.  Plan: Problem List Items Addressed This Visit       Other   Anemia   Relevant Medications   Ferrous Sulfate (IRON) 325 (65  Fe) MG TABS   Other Relevant Orders   IBC panel   Ferritin   Tobacco abuse   Relevant Orders   Ambulatory Referral for Lung Cancer Scre   Other Visit Diagnoses     Annual physical exam    -  Primary   Need for influenza vaccination       Relevant Orders   Flu Vaccine QUAD 29moIM (Fluarix, Fluzone & Alfiuria Quad PF) (Completed)   Rectal lesion       Relevant Orders   Ambulatory referral to General Surgery       Screening: -- Blood pressure screen normal -- cholesterol screening: not due for screening -- Weight  screening: normal -- Diabetes Screening: not due for screening -- Nutrition: Encouraged healthy diet  The ASCVD Risk score (Mikey BussingDC Jr., et al., 2013) failed to calculate for the following reasons:   The valid HDL cholesterol range is 20 to 100 mg/dL  -- Statin therapy for Age 58-75with CVD risk >7.5%  Psych -- Depression screening (PHQ-9): negative   Safety -- tobacco screening:  not ready to quit -- alcohol screening:  low-risk usage. -- no evidence of domestic violence or intimate partner violence.   Cancer Screening -- pap smear not collected per ASCCP guidelines -- family history of breast cancer screening: done. not at high risk. -- Mammogram -  pt scheduled -- Colon cancer (age 58+--  up to date  Immunizations Immunization History  Administered Date(s) Administered   Hepatitis B 01/23/2016   Influenza Inj Mdck Quad With Preservative 02/01/2019   Influenza,inj,Quad PF,6+ Mos 12/06/2020   Moderna Sars-Covid-2 Vaccination 06/24/2019, 07/22/2019, 04/27/2020   Tdap 10/03/2015    -- flu vaccine up to date -- TDAP q10 years up to date -- Shingles (age >>28 declined -- PPSV-23 (19-64 with chronic disease or smoking) not up to date - declined -- Covid-19 Vaccine up to date   Encouraged healthy diet and exercise. Encouraged regular vision and dental care.    JLesleigh Noe MD

## 2020-12-12 ENCOUNTER — Other Ambulatory Visit: Payer: Self-pay

## 2020-12-12 DIAGNOSIS — G8929 Other chronic pain: Secondary | ICD-10-CM

## 2020-12-12 DIAGNOSIS — M545 Low back pain, unspecified: Secondary | ICD-10-CM

## 2020-12-12 MED ORDER — METHOCARBAMOL 750 MG PO TABS: 750.0000 mg | ORAL_TABLET | ORAL | 0 refills | Status: DC | PRN

## 2020-12-12 MED ORDER — FLUTICASONE PROPIONATE 50 MCG/ACT NA SUSP: 1.0000 | Freq: Every day | NASAL | 1 refills | Status: DC

## 2020-12-21 ENCOUNTER — Ambulatory Visit
Admission: RE | Admit: 2020-12-21 | Discharge: 2020-12-21 | Disposition: A | Discharge status: Home / Self Care | Payer: BC Managed Care – PPO | Source: Ambulatory Visit | Attending: Obstetrics & Gynecology | Admitting: Obstetrics & Gynecology

## 2020-12-21 ENCOUNTER — Other Ambulatory Visit: Payer: Self-pay

## 2020-12-21 DIAGNOSIS — N6489 Other specified disorders of breast: Secondary | ICD-10-CM

## 2020-12-21 DIAGNOSIS — R922 Inconclusive mammogram: Secondary | ICD-10-CM | POA: Diagnosis not present

## 2021-02-18 DIAGNOSIS — M9902 Segmental and somatic dysfunction of thoracic region: Secondary | ICD-10-CM | POA: Diagnosis not present

## 2021-02-18 DIAGNOSIS — M9907 Segmental and somatic dysfunction of upper extremity: Secondary | ICD-10-CM | POA: Diagnosis not present

## 2021-02-18 DIAGNOSIS — M9901 Segmental and somatic dysfunction of cervical region: Secondary | ICD-10-CM | POA: Diagnosis not present

## 2021-02-18 DIAGNOSIS — M9903 Segmental and somatic dysfunction of lumbar region: Secondary | ICD-10-CM | POA: Diagnosis not present

## 2021-04-24 DIAGNOSIS — M67814 Other specified disorders of tendon, left shoulder: Secondary | ICD-10-CM | POA: Diagnosis not present

## 2021-04-29 DIAGNOSIS — Z01419 Encounter for gynecological examination (general) (routine) without abnormal findings: Secondary | ICD-10-CM | POA: Diagnosis not present

## 2021-04-29 DIAGNOSIS — A630 Anogenital (venereal) warts: Secondary | ICD-10-CM | POA: Diagnosis not present

## 2021-04-30 DIAGNOSIS — M67814 Other specified disorders of tendon, left shoulder: Secondary | ICD-10-CM | POA: Diagnosis not present

## 2021-05-06 DIAGNOSIS — M67814 Other specified disorders of tendon, left shoulder: Secondary | ICD-10-CM | POA: Diagnosis not present

## 2021-05-13 ENCOUNTER — Other Ambulatory Visit: Payer: Self-pay

## 2021-05-13 ENCOUNTER — Ambulatory Visit
Admission: RE | Admit: 2021-05-13 | Discharge: 2021-05-13 | Disposition: A | Discharge status: Home / Self Care | Payer: No Typology Code available for payment source | Source: Ambulatory Visit | Attending: Medical | Admitting: Medical

## 2021-05-13 ENCOUNTER — Encounter: Payer: Self-pay | Admitting: Medical

## 2021-05-13 ENCOUNTER — Ambulatory Visit
Admission: RE | Admit: 2021-05-13 | Discharge: 2021-05-13 | Disposition: A | Discharge status: Home / Self Care | Payer: No Typology Code available for payment source | Attending: Medical | Admitting: Medical

## 2021-05-13 ENCOUNTER — Ambulatory Visit: Payer: BC Managed Care – PPO | Admitting: Medical

## 2021-05-13 VITALS — BP 118/68 | HR 100 | Temp 97.8°F | Resp 16

## 2021-05-13 DIAGNOSIS — Z026 Encounter for examination for insurance purposes: Secondary | ICD-10-CM

## 2021-05-13 DIAGNOSIS — S81801A Unspecified open wound, right lower leg, initial encounter: Secondary | ICD-10-CM

## 2021-05-13 DIAGNOSIS — S8011XA Contusion of right lower leg, initial encounter: Secondary | ICD-10-CM

## 2021-05-13 MED ORDER — AMOXICILLIN-POT CLAVULANATE 875-125 MG PO TABS: 1.0000 | ORAL_TABLET | Freq: Two times a day (BID) | ORAL | 0 refills | Status: DC

## 2021-05-13 NOTE — Patient Instructions (Signed)
Skin Tear A skin tear is a wound in which the top layers of skin have peeled off from the deeper skin or tissues underneath. This is a common problem as people get older because the skin becomes thinner and more fragile. In addition, some medicines, such as oral corticosteroids, can lead to thinning skin if they are taken for long periods of time. A skin tear is often repaired with tape or skin adhesive strips. Depending on the location of the wound, a bandage (dressing) may be applied over the tape or adhesive strips. Follow these instructions at home: Wound care  Clean the wound as told by your health care provider. You may be instructed to keep the wound dry for the first few days. If you are told to clean the wound: Wash the wound as told by your health care provider. This may include using mild soap and water, a wound cleanser, or a salt-water (saline) solution. If using soap, rinse the wound with water to remove all soap. Do not rub the wound dry. Pat it gently with a clean towel or let it air-dry. Change any dressings as told by your health care provider. This may include changing the dressing if it gets wet, gets dirty, or starts to smell bad. Wash your hands with soap and water for at least 20 seconds before and after you change your bandage (dressing). If soap and water are not available, use hand sanitizer. Leave tape or skin adhesive strips in place. These skin closures may need to stay in place for 2 weeks or longer. If adhesive strip edges start to loosen and curl up, you may trim the loose edges. Do not remove adhesive strips completely unless your health care provider tells you to do that. Check your wound every day for signs of infection. Check for: Redness, swelling, or pain. More fluid or blood. Warmth. Pus or a bad smell. Do not scratch or pick at the wound. Protect the injured area until it has healed. Medicines Take or apply over-the-counter and prescription medicines only  as told by your health care provider. If you were prescribed an antibiotic medicine, take or apply it as told by your health care provider. Do not stop using the antibiotic even if your condition improves. General instructions  Keep the dressing dry as told by your health care provider. Do not take baths, swim, use a hot tub, or do anything that puts your wound underwater until your health care provider approves. Ask your health care provider if you may take showers. You may only be allowed to take sponge baths. Keep all follow-up visits. This is important. Contact a health care provider if: You have redness, swelling, or pain around your wound. You have more fluid or blood coming from your wound. Your wound, or the area around your wound, feels warm to the touch. You have pus or a bad smell coming from your wound. Get help right away if: You have a red streak that goes away from the skin tear. You have a fever and chills, and your symptoms suddenly get worse. Summary A skin tear is a wound in which the top layers of skin have peeled off from the deeper skin or tissues underneath.Tdap (Tetanus, Diphtheria, Pertussis) Vaccine: What You Need to Know 1. Why get vaccinated? Tdap vaccine can prevent tetanus, diphtheria, and pertussis. Diphtheria and pertussis spread from person to person. Tetanus enters the body through cuts or wounds. TETANUS (T) causes painful stiffening of the muscles. Tetanus can  lead to serious health problems, including being unable to open the mouth, having trouble swallowing and breathing, or death. DIPHTHERIA (D) can lead to difficulty breathing, heart failure, paralysis, or death. PERTUSSIS (aP), also known as "whooping cough," can cause uncontrollable, violent coughing that makes it hard to breathe, eat, or drink. Pertussis can be extremely serious especially in babies and young children, causing pneumonia, convulsions, brain damage, or death. In teens and adults, it can  cause weight loss, loss of bladder control, passing out, and rib fractures from severe coughing. 2. Tdap vaccine Tdap is only for children 7 years and older, adolescents, and adults.  Adolescents should receive a single dose of Tdap, preferably at age 43 or 38 years. Pregnant people should get a dose of Tdap during every pregnancy, preferably during the early part of the third trimester, to help protect the newborn from pertussis. Infants are most at risk for severe, life-threatening complications from pertussis. Adults who have never received Tdap should get a dose of Tdap. Also, adults should receive a booster dose of either Tdap or Td (a different vaccine that protects against tetanus and diphtheria but not pertussis) every 10 years, or after 5 years in the case of a severe or dirty wound or burn. Tdap may be given at the same time as other vaccines. 3. Talk with your health care provider Tell your vaccine provider if the person getting the vaccine: Has had an allergic reaction after a previous dose of any vaccine that protects against tetanus, diphtheria, or pertussis, or has any severe, life-threatening allergies Has had a coma, decreased level of consciousness, or prolonged seizures within 7 days after a previous dose of any pertussis vaccine (DTP, DTaP, or Tdap) Has seizures or another nervous system problem Has ever had Guillain-Barr Syndrome (also called "GBS") Has had severe pain or swelling after a previous dose of any vaccine that protects against tetanus or diphtheria In some cases, your health care provider may decide to postpone Tdap vaccination until a future visit. People with minor illnesses, such as a cold, may be vaccinated. People who are moderately or severely ill should usually wait until they recover before getting Tdap vaccine.  Your health care provider can give you more information. 4. Risks of a vaccine reaction Pain, redness, or swelling where the shot was given, mild  fever, headache, feeling tired, and nausea, vomiting, diarrhea, or stomachache sometimes happen after Tdap vaccination. People sometimes faint after medical procedures, including vaccination. Tell your provider if you feel dizzy or have vision changes or ringing in the ears.  As with any medicine, there is a very remote chance of a vaccine causing a severe allergic reaction, other serious injury, or death. 5. What if there is a serious problem? An allergic reaction could occur after the vaccinated person leaves the clinic. If you see signs of a severe allergic reaction (hives, swelling of the face and throat, difficulty breathing, a fast heartbeat, dizziness, or weakness), call 9-1-1 and get the person to the nearest hospital. For other signs that concern you, call your health care provider.  Adverse reactions should be reported to the Vaccine Adverse Event Reporting System (VAERS). Your health care provider will usually file this report, or you can do it yourself. Visit the VAERS website at www.vaers.SamedayNews.es or call 774-605-9246. VAERS is only for reporting reactions, and VAERS staff members do not give medical advice. 6. The National Vaccine Injury Compensation Program The [employer] Vaccine Injury Compensation Program (VICP) is a Technical brewer that  was created to compensate people who may have been injured by certain vaccines. Claims regarding alleged injury or death due to vaccination have a time limit for filing, which may be as short as two years. Visit the VICP website at GoldCloset.com.ee or call 620-812-5275 to learn about the program and about filing a claim. 7. How can I learn more? Ask your health care provider. Call your local or state health department. Visit the website of the Food and Drug Administration (FDA) for vaccine package inserts and additional information at TraderRating.uy. Contact the Centers for Disease Control and Prevention  (CDC): Call 848-609-3980 (1-800-CDC-INFO) or Visit CDC's website at http://hunter.com/. Vaccine Information Statement Tdap (Tetanus, Diphtheria, Pertussis) Vaccine (11/18/2019) This information is not intended to replace advice given to you by your health care provider. Make sure you discuss any questions you have with your health care provider. Document Revised: 12/14/2019 Document Reviewed: 12/14/2019 Elsevier Patient Education  [place]. Contusion A contusion is a deep bruise. This is a result of an injury that causes bleeding under the skin. Symptoms of bruising include pain, swelling, and discolored skin. The skin may turn blue, purple, or yellow. Follow these instructions at home: Managing pain, stiffness, and swelling You may use RICE. This stands for: Resting. Icing. Compression, or putting pressure. Elevating, or raising the injured area. To follow this method, do these actions: Rest the injured area. If told, put ice on the injured area. Put ice in a plastic bag. Place a towel between your skin and the bag. Leave the ice on for 20 minutes, 2-3 times per day. If told, put light pressure (compression) on the injured area using an elastic bandage. Make sure the bandage is not too tight. If the area tingles or becomes numb, remove it and put it back on as told by your doctor. If possible, raise (elevate) the injured area above the level of your heart while you are sitting or lying down.  General instructions Take over-the-counter and prescription medicines only as told by your doctor. Keep all follow-up visits as told by your doctor. This is important. Contact a doctor if: Your symptoms do not get better after several days of treatment. Your symptoms get worse. You have trouble moving the injured area. Get help right away if: You have very bad pain. You have a loss of feeling (numbness) in a hand or foot. Your hand or foot turns pale or cold. Summary A  contusion is a deep bruise. This is a result of an injury that causes bleeding under the skin. Symptoms of bruising include pain, swelling, and discolored skin. The skin may turn blue, purple, or yellow. This condition is treated with rest, ice, compression, and elevation. This is also called RICE. You may be given over-the-counter medicines for pain. Contact a doctor if you do not feel better, or you feel worse. Get help right away if you have very bad pain, have lost feeling in a hand or foot, or the area turns pale or cold. This information is not intended to replace advice given to you by your health care provider. Make sure you discuss any questions you have with your health care provider. Document Revised: 11/20/2017 Document Reviewed: 11/20/2017 Elsevier Patient Education  [place].

## 2021-05-13 NOTE — Progress Notes (Signed)
Patient ID: Linda Tran, female   DOB: 08-02-1960, 61 y.o.   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 The flower pot was also behind the back door.   Provider Note Patient tripped over flower pot behind a door and hit her right lower leg on the pot. There is an avulsion  1 cm x 1 cm on the right  center of mid lower leg. Tender on bone below the avulsion.  Mild swelling around the avulsion.  Full weight bearing. Can flex and extend her foot and move leg without dificulty.   Diagnosis  Right Lower leg avulsion, tender to palpation. Will obtain x-ray ( right Tib/fFib).  Area cleaned with betadine and saline, neosporin to the site and a dresssing to the area. . Patient tolerated procedure well.  Medications Prescribed  Ibuprofen 600 mg every  6 hours as needed for pain. Amoxil 875 mg po BID x 10 days. Tdap updated, last  update was 2017. Referred to [facility] outpatient imaging center for  Tib/Fib  x-ray.    Return to Work Status May return to work if x-ray is negative for injury.   Provider Signature ________________________________________Date_________   Employee Signature  _______________________________________Date_________   Please email this completed form to Ciro Backer, Director of Risk Management at [email or web address] within 24 hours of visit.

## 2021-05-16 ENCOUNTER — Ambulatory Visit: Payer: BC Managed Care – PPO | Admitting: Nurse Practitioner

## 2021-05-16 ENCOUNTER — Encounter: Payer: Self-pay | Admitting: Nurse Practitioner

## 2021-05-16 VITALS — BP 118/74 | HR 100 | Temp 96.8°F | Resp 18

## 2021-05-16 DIAGNOSIS — S81801D Unspecified open wound, right lower leg, subsequent encounter: Secondary | ICD-10-CM

## 2021-05-16 NOTE — Progress Notes (Signed)
[facility] [address]  [place], [place] [address] Phone [phone] Fax  [phone]  [place] Date of [id number] Phone Number: [phone] Email: [email or web address] Department: Facilities Job Title: Custodian  Supervisor: Olene Floss  Supervisor Notified: Yes  Date of Injury: 05/13/21 Time of Injury: 0800 Shift Worked: [employer] Location where injury occurred (address or landmark): [facility]   Body Part Injured: Right Leg  Vital Signs Today's Vitals   05/16/21 0907  BP: 118/74  Pulse: 100  Resp: 18  Temp: (!) 96.8 F (36 C)  SpO2: 95%   There is no height or weight on file to calculate BMI.   Injury Description  Patient reports she was taking two bags of trash out of the back door and going to the trash can behind the back door when she tripped over a large flower pot. The flower pot was also behind the back door.     Provider Note Wound remains open without drainage, patient denies bleeding. Quarter sized round avulsion to right calf. Tender around wound without redness. ROM intact. Some tenderness at knee, non tender to palpation more painful with ambulation.   XRAY performed after last visit: Narrative & Impression  CLINICAL DATA:  Trauma, pain   EXAM: RIGHT TIBIA AND FIBULA - 2 VIEW   COMPARISON:  None.   FINDINGS: There is no evidence of fracture or other focal bone lesions. Soft tissues are unremarkable.   IMPRESSION: No fracture or dislocation is seen in the right tibia and fibula.     Electronically Signed   By: Elmer Picker M.D.   On: 05/13/2021 15:21     Diagnosis  Right Lower Leg avulsion   Medications Prescribed  Continue medications as previously prescribed and directed   Referred to N/A    Return to Work Status Patient may return to work without restrictions. Return to [employer] with new/worsening  symptoms or impaired wound healing.    Provider Signature ________________________________________Date_________   Employee Signature _______________________________________Date_________   Please email this completed form to Ciro Backer, Director of Risk Management at [email or web address] within 24 hours of visit.

## 2021-08-10 ENCOUNTER — Other Ambulatory Visit: Payer: Self-pay | Admitting: Family Medicine

## 2021-08-10 DIAGNOSIS — G8929 Other chronic pain: Secondary | ICD-10-CM

## 2021-08-23 ENCOUNTER — Other Ambulatory Visit: Payer: Self-pay | Admitting: Family Medicine

## 2021-08-23 DIAGNOSIS — M545 Low back pain, unspecified: Secondary | ICD-10-CM

## 2021-08-23 NOTE — Telephone Encounter (Signed)
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 ? ?Will call back, but will need refill prior to that if possible ?

## 2021-08-28 ENCOUNTER — Ambulatory Visit: Payer: BC Managed Care – PPO | Admitting: Nurse Practitioner

## 2021-08-28 ENCOUNTER — Encounter: Payer: Self-pay | Admitting: Nurse Practitioner

## 2021-08-28 DIAGNOSIS — G8929 Other chronic pain: Secondary | ICD-10-CM

## 2021-08-28 MED ORDER — METHOCARBAMOL 750 MG PO TABS: ORAL_TABLET | ORAL | 3 refills | Status: AC

## 2021-08-28 NOTE — Progress Notes (Signed)
   Subjective:    Patient ID: Linda Tran, female    DOB: 1961-04-02, 61 y.o.   MRN: [id number]  HPI  61 year old female presenting to [facility] request for refill on Robaxin.  She was at her PCP in January for a physical   She needs a refill on Robaxin- she uses this frequently for lower back pain that worsens when she has increased work load. 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   Typically gets refills through PCP but has been unable to be seen.    Today's Vitals   08/28/21 1511  BP: 110/70  Pulse: (!) 102  Resp: 16  Temp: 98.8 F (37.1 C)  TempSrc: Tympanic  SpO2: 97%   There is no height or weight on file to calculate BMI.   Review of Systems  Constitutional: Negative.   HENT: Negative.    Respiratory: Negative.    Genitourinary: Negative.   Musculoskeletal:  Positive for back pain.  Neurological: Negative.       Objective:   Physical Exam HENT:     Head: Normocephalic.  Pulmonary:     Effort: Pulmonary effort is normal.  Musculoskeletal:     Lumbar back: Tenderness present. No swelling, edema, deformity, spasms or bony tenderness. Normal range of motion.       Back:  Skin:    General: Skin is warm.  Neurological:     General: No focal deficit present.     Mental Status: She is alert.          Assessment & Plan:  1. Chronic left-sided low back pain without sciatica  - methocarbamol (ROBAXIN) 750 MG tablet; TAKE 1 TABLET BY MOUTH UP TO 3 TIMES DAILY AS NEEDED  Dispense: 90 tablet; Refill: 3    Refill provided - advised that this should not be used in combination with other anti-inflammatory agents including Mobic, ibuprofen, advil or aleve   Take with food

## 2021-09-08 IMAGING — MG MM DIGITAL DIAGNOSTIC UNILAT*R* W/ TOMO W/ CAD
8 series · 8 of 24 positions shown · non-contrast
Comparison: Previous exam(s).

CLINICAL DATA: 59-year-old female presenting as a recall from
screening for possible right breast asymmetry.

EXAM:
DIGITAL DIAGNOSTIC RIGHT MAMMOGRAM WITH TOMO
ULTRASOUND RIGHT BREAST

[R MLO synth-2D (1 of 3)]
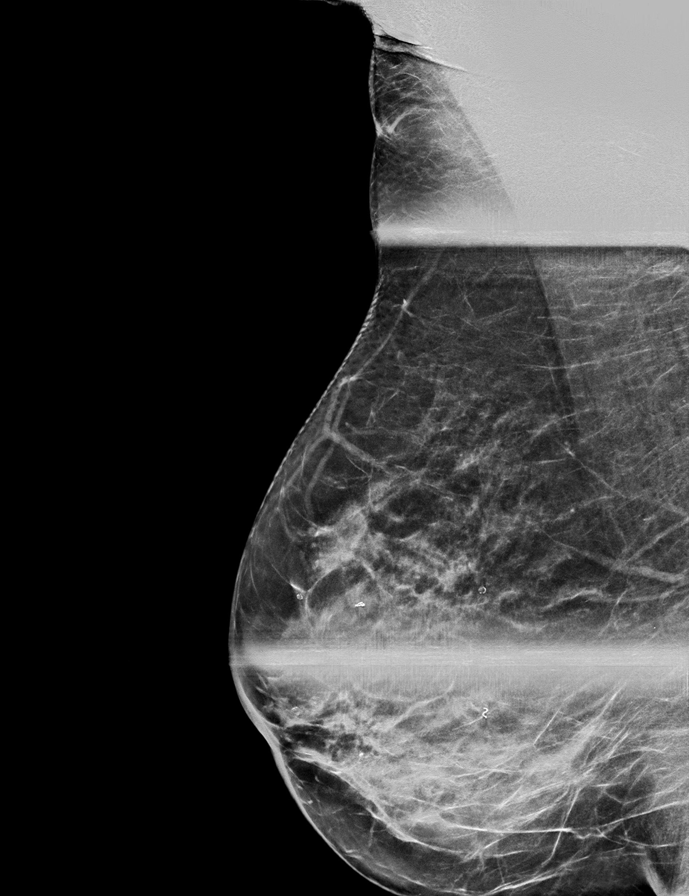

[R MLO synth-2D (2 of 3)]
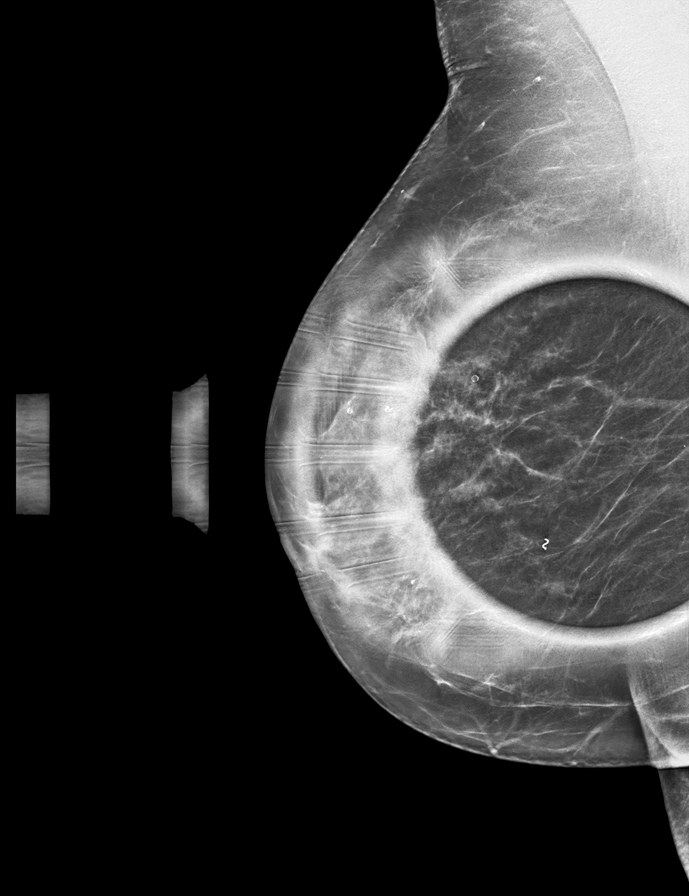

[R MLO synth-2D (3 of 3)]
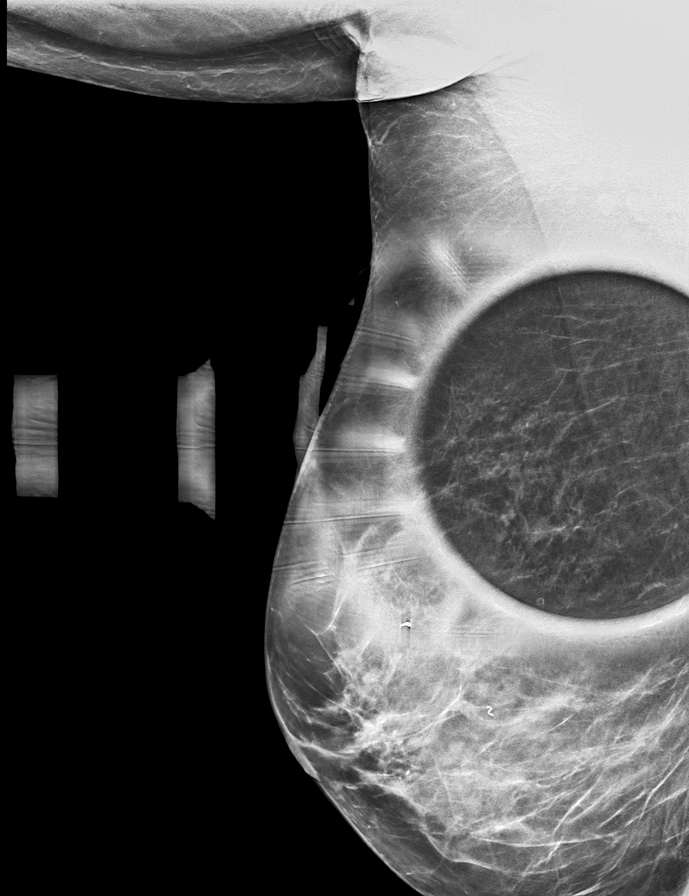

[R ML synth-2D]
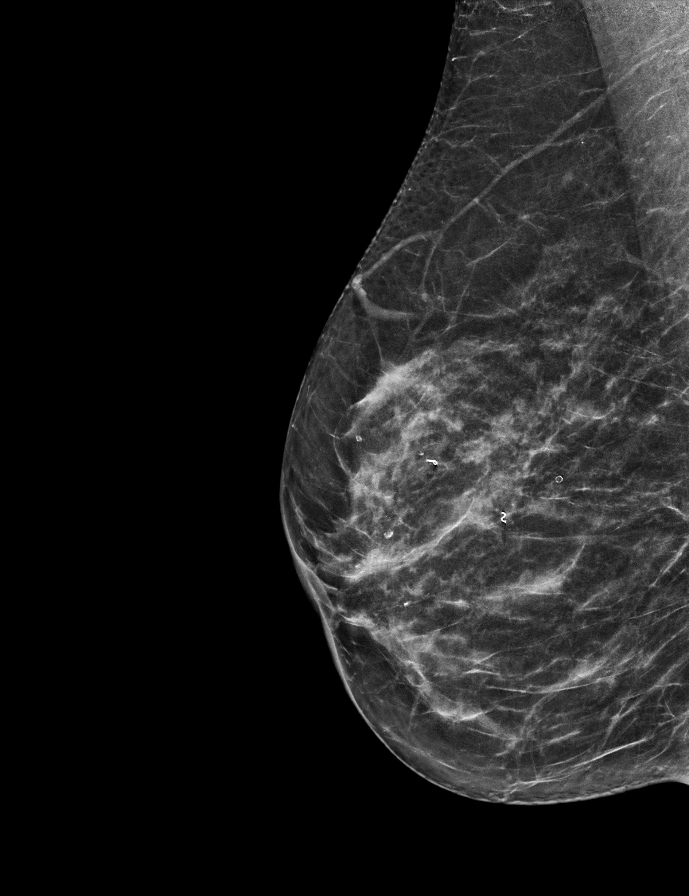

[R MLO tomo (1 of 3) · tomo slice 27/53.0]
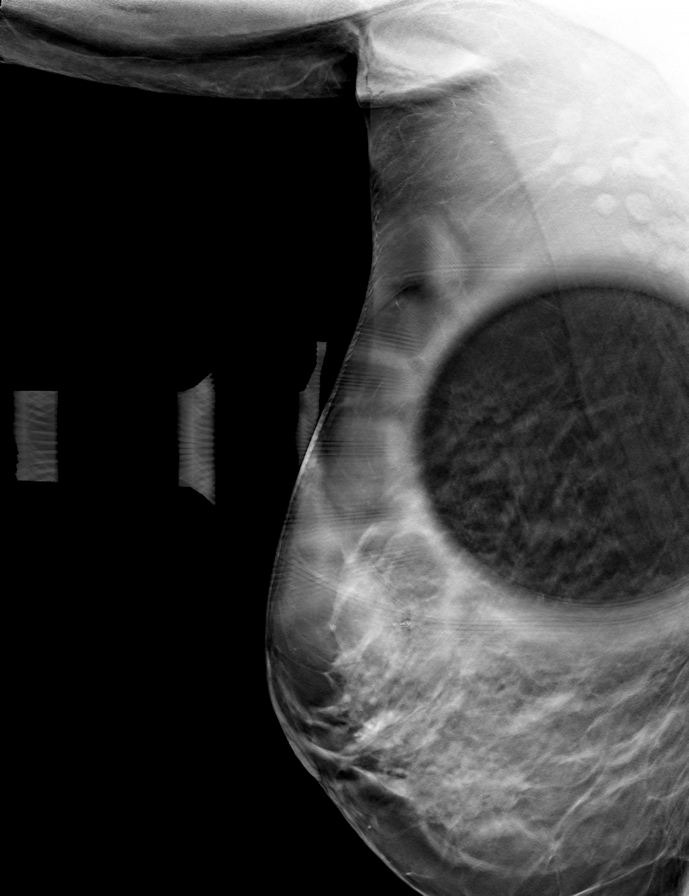

[R ML tomo · tomo slice 30/59.0]
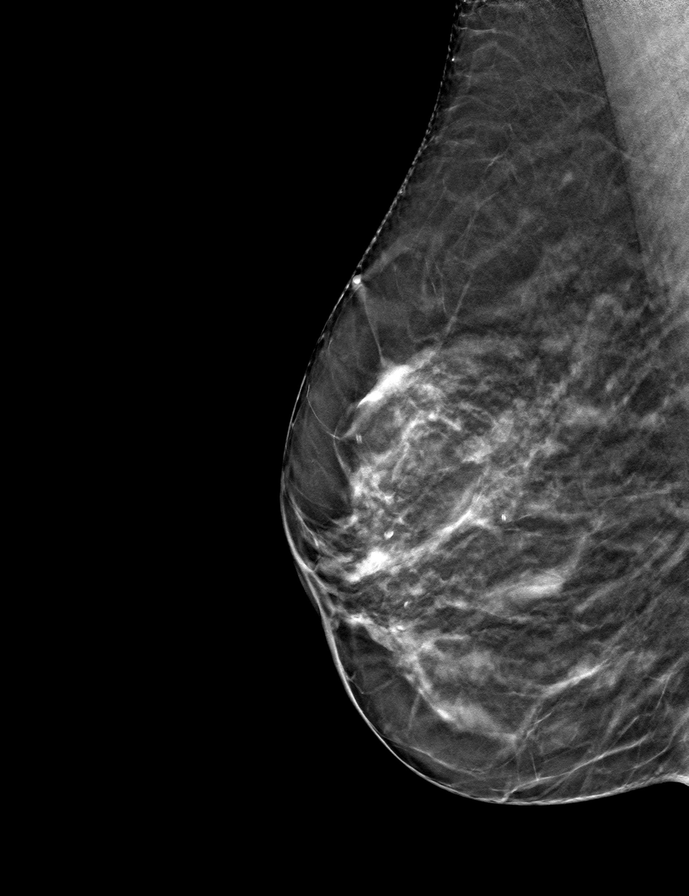

[R MLO tomo (2 of 3) · tomo slice 29/57.0]
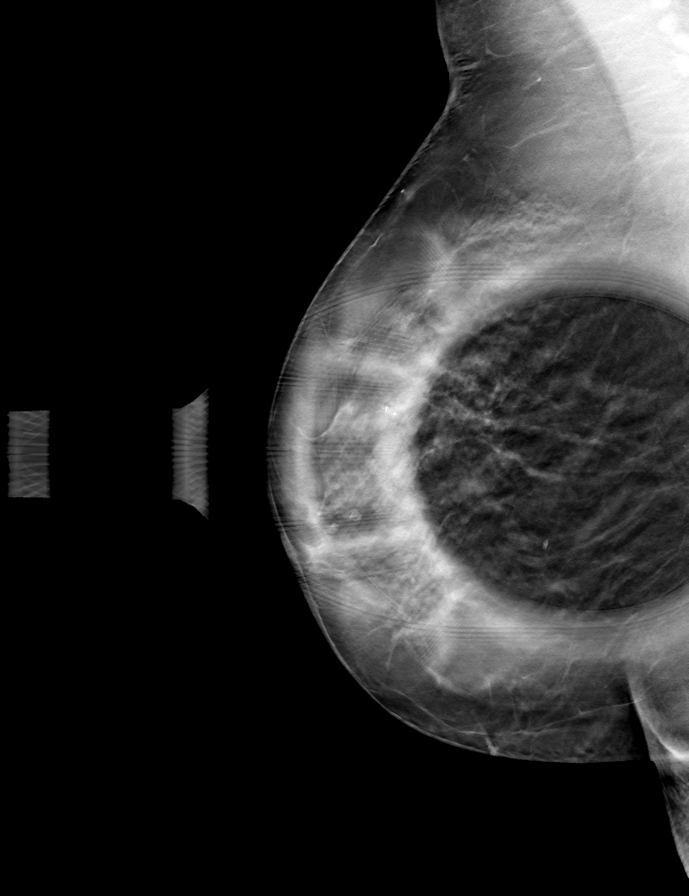

[R MLO tomo (3 of 3) · tomo slice 29/58.0]
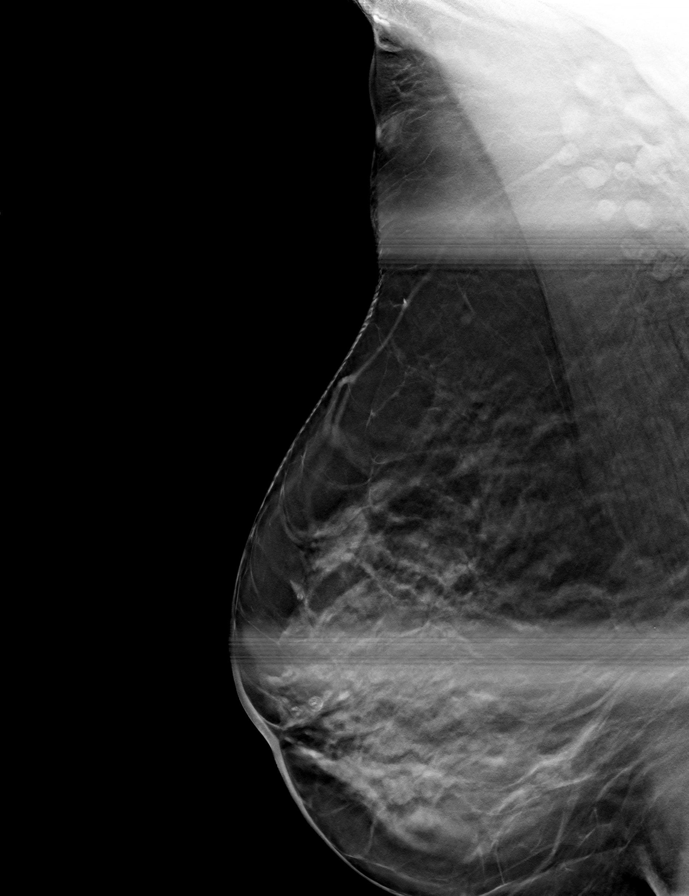

[8 of 24 positions shown; findings below may reference images not displayed]

ACR Breast Density Category c: The breast tissue is heterogeneously
dense, which may obscure small masses.
FINDINGS: Mammogram:

Spot compression tomosynthesis well as full field mL views of the
right breast were performed. There is persistence of an asymmetry in
the upper likely outer right breast posterior depth measuring
approximately 0.9 cm, best seen on the full field mL view.

Ultrasound:

Targeted ultrasound is performed throughout the superior aspect of
the right breast demonstrating fibrocystic changes including an oval
circumscribed hypoechoic mass with single thin septation at 10
o'clock 7 cm from the nipple measuring 0.9 x 0.5 x 0.8 cm. This mass
may correspond to the asymmetry seen mammographically.
IMPRESSION: Probably benign right breast asymmetry with possible sonographic
correlate at 10 o'clock, which may represent a complicated cyst.

RECOMMENDATION:
Diagnostic right breast mammogram and ultrasound in 6 months.

I have discussed the findings and recommendations with the patient.
If applicable, a reminder letter will be sent to the patient
regarding the next appointment.

BI-RADS CATEGORY  3: Probably benign.

## 2021-09-27 ENCOUNTER — Ambulatory Visit: Payer: BC Managed Care – PPO | Admitting: Adult Health

## 2021-09-27 ENCOUNTER — Encounter: Payer: Self-pay | Admitting: Adult Health

## 2021-09-27 VITALS — BP 120/66 | HR 80 | Temp 97.8°F | Wt 124.0 lb

## 2021-09-27 DIAGNOSIS — B349 Viral infection, unspecified: Secondary | ICD-10-CM

## 2021-09-27 LAB — POC COVID19 BINAXNOW: SARS Coronavirus 2 Ag: NEGATIVE

## 2021-09-27 NOTE — Progress Notes (Signed)
[facility] [address], [place] [address]   Office Visit Note  Patient Name: Linda Tran Date of Birth [id number]  Medical Record number [id number]  Date of Service: 09/27/2021  Chief Complaint  Patient presents with   Sore Throat   Cough     Sore Throat  Associated symptoms include coughing.  Cough Associated symptoms include a sore throat.   Pt is here for a sick visit. She reports she started coughing yesterday and now has a sore throat. She denies fever or chills.  She took Nyquil last night with some relief.     Current Medication:  Outpatient Encounter Medications as of 09/27/2021  Medication Sig   fluticasone (FLONASE) 50 MCG/ACT nasal spray Place 1 spray into both nostrils daily.   meloxicam (MOBIC) 15 MG tablet meloxicam 15 mg tablet  TAKE 1 TABLET BY MOUTH ONCE A DAY   methocarbamol (ROBAXIN) 750 MG tablet TAKE 1 TABLET BY MOUTH UP TO 3 TIMES DAILY AS NEEDED   Multiple Vitamin (MULTIVITAMIN ADULT PO) multivitamin   Multiple Vitamins-Minerals (WOMENS MULTIVITAMIN PO) Take 1 tablet by mouth daily.   Omega-3 Fatty Acids (FISH OIL PO) Take 1,400 mg by mouth.   No facility-administered encounter medications on file as of 09/27/2021.      Medical History: Past Medical History:  Diagnosis Date   Allergy    Anemia    Breast cancer ([facility]) 2013   left breast, DCIS, radiation   Hx of migraine headaches    Personal history of radiation therapy 2013   F/U from lumpectomy for DCIS     Vital Signs: BP 120/66   Pulse 80   Temp 97.8 F (36.6 C) (Tympanic)   Wt 124 lb (56.2 kg)   LMP 01/06/2014   SpO2 98%   BMI 23.35 kg/m    Review of Systems  HENT:  Positive for sore throat.   Respiratory:  Positive for cough.     Physical Exam Constitutional:      Appearance: She is normal weight.  HENT:     Head: Normocephalic.     Salivary Glands: Right salivary gland is not diffusely enlarged or tender. Left salivary gland is not diffusely  enlarged or tender.     Mouth/Throat:     Pharynx: No pharyngeal swelling.     Tonsils: No tonsillar exudate.  Cardiovascular:     Rate and Rhythm: Normal rate.     Heart sounds: No murmur heard. Pulmonary:     Effort: Pulmonary effort is normal. No respiratory distress.     Breath sounds: Normal breath sounds. No wheezing or rhonchi.  Lymphadenopathy:     Cervical: No cervical adenopathy.  Neurological:     Mental Status: She is alert.    Assessment/Plan: 1. Viral illness Use over the counter medications for symptom management. Such as dayquil/nyquil and allergy medications like zyrtec.   Follow up if symptoms worsen or fail to improve. Covid negative at this time.       General Counseling: Linda Tran understanding of the findings of todays visit and agrees with plan of treatment. I have discussed any further diagnostic evaluation that may be needed or ordered today. We also reviewed her medications today. she has been encouraged to call the office with any questions or concerns that should arise related to todays visit.   Orders Placed This Encounter  Procedures   POC COVID-19    No orders of the defined types were placed in this encounter.  Time spent:20 Minutes    Kendell Bane AGNP-C Nurse Practitioner

## 2021-10-01 ENCOUNTER — Ambulatory Visit: Payer: BC Managed Care – PPO | Admitting: Medical

## 2021-10-01 ENCOUNTER — Encounter: Payer: Self-pay | Admitting: Medical

## 2021-10-01 VITALS — BP 108/68 | HR 84 | Temp 98.4°F | Resp 16

## 2021-10-01 DIAGNOSIS — S51819A Laceration without foreign body of unspecified forearm, initial encounter: Secondary | ICD-10-CM

## 2021-10-01 NOTE — Patient Instructions (Signed)
Skin Tear A skin tear is a wound in which the top layers of skin peel off. This is a common problem for older people. It can also be a problem for people who take certain medicines for too long. To repair the skin, your doctor may use: Tape. Skin tape (adhesive) strips. A bandage (dressing) may also be placed over the tape or skin tape strips. Follow these instructions at home: Keep your wound clean Clean the wound as told by your doctor. You may be told to keep the wound dry for the first few days. If you are told to clean the wound: Wash the wound with mild soap and water, a wound cleanser, or a salt-water (saline) solution. If you use soap, rinse the wound with water to get all the soap off. Do not rub the wound dry. Pat the wound gently with a clean towel or let it air-dry. Change any bandage as told by your doctor. This includes changing the bandage if it gets wet, gets dirty, or starts to smell bad. To change your bandage: Wash your hands with soap and water for at least 20 seconds before and after you change your bandage. If you cannot use soap and water, use hand sanitizer. Leave tape or skin tape strips alone unless you are told to take them off. You may trim the edges of the tape strips if they curl up. Watch for signs of infection Check your wound every day for signs of infection. Check for: Redness, swelling, or pain. More fluid or blood. Warmth. Pus or a bad smell.  Protect your wound Do not scratch or pick at the wound. Protect the injured area until it has healed. Medicines Take or apply over-the-counter and prescription medicines only as told by your doctor. If you were prescribed an antibiotic medicine, take or apply it as told by your doctor. Do not stop using it even if your condition gets better. General instructions  Keep the bandage dry. Do not take baths, swim, use a hot tub, or do anything that puts your wound underwater. Ask your doctor about taking showers or  sponge baths. Keep all follow-up visits. Contact a doctor if: You have any of these signs of infection in your wound: Redness, swelling, or pain. More fluid or blood. Warmth. Pus or a bad smell. Get help right away if: You have a red streak that goes away from the skin tear. You have a fever and chills, and your symptoms get worse all of a sudden. Summary A skin tear is a wound in which the top layers of skin peel off. To repair the skin, your doctor may use tape or skin tape strips. Change any bandage as told by your doctor. Take or apply over-the-counter and prescription medicines only as told by your doctor. Contact a doctor if you have signs of infection. This information is not intended to replace advice given to you by your health care provider. Make sure you discuss any questions you have with your health care provider. Document Revised: 07/06/2019 Document Reviewed: 07/06/2019 Elsevier Patient Education  2023 Elsevier Inc.  

## 2021-10-01 NOTE — Progress Notes (Signed)
   Subjective:    Patient ID: Linda Tran, female    DOB: 1960/05/09, 61 y.o.   MRN: [id number]  HPI 61 yo female in non acute distress, trying to open/close a window and the rod that opens the blind scraped her forearm causing a skin tear and bruising. Patient takes a nASA '81mg'$   daily.  Review of Systems  Skin:  Positive for wound (on left forearm).  All other systems reviewed and are negative.      Objective:   Physical Exam Vitals and nursing note reviewed.  Constitutional:      Appearance: Normal appearance.  HENT:     Head: Normocephalic and atraumatic.  Pulmonary:     Effort: Pulmonary effort is normal.  Skin:    General: Skin is warm and dry.     Findings: Bruising present.     Comments: Skin tear 1 cm mid left forearm with bruising.  Neurological:     General: No focal deficit present.     Mental Status: She is alert and oriented to person, place, and time. Mental status is at baseline.  Psychiatric:        Mood and Affect: Mood normal.        Behavior: Behavior normal.        Thought Content: Thought content normal.        Judgment: Judgment normal.           Assessment & Plan:  Skin tear/ contusion Area saline cleaned , neosporin to the site and a bandage to the area. Leave dressing on  x 24 hours. To clean twice daily and cover with a bandage. Patient verbalizes understanding and has no questions at discharge. Tdap was updated on  04/2021. Patient to return to clinic as needed.

## 2021-11-20 ENCOUNTER — Encounter: Payer: Self-pay | Admitting: Nurse Practitioner

## 2021-11-20 ENCOUNTER — Ambulatory Visit (INDEPENDENT_AMBULATORY_CARE_PROVIDER_SITE_OTHER): Payer: Self-pay | Admitting: Nurse Practitioner

## 2021-11-20 VITALS — BP 110/62 | HR 96 | Temp 98.0°F | Ht 62.0 in | Wt 125.4 lb

## 2021-11-20 DIAGNOSIS — S60462A Insect bite (nonvenomous) of right middle finger, initial encounter: Secondary | ICD-10-CM

## 2021-11-20 DIAGNOSIS — W57XXXA Bitten or stung by nonvenomous insect and other nonvenomous arthropods, initial encounter: Secondary | ICD-10-CM

## 2021-11-20 MED ORDER — LORATADINE 10 MG PO TABS
10.0000 mg | ORAL_TABLET | Freq: Once | ORAL | Status: AC
  Administered 2021-11-20: 10 mg via ORAL

## 2021-11-20 MED ORDER — IBUPROFEN 200 MG PO TABS
200.0000 mg | ORAL_TABLET | Freq: Once | ORAL | Status: AC
  Administered 2021-11-20: 200 mg via ORAL

## 2021-11-20 NOTE — Progress Notes (Signed)
   Subjective:    Patient ID: Linda Tran, female    DOB: 1961-03-22, 61 y.o.   MRN: [id number]  HPI 61 year old female presenting to [facility] after she was stung by something that was flying out of a trash can this morning. She did not see what stung her.   Her right hand middle finger started to swell after bite.   Denies difficulty breathing or other systemic symptoms.  Denies a history of allergic reactions to insect bites in the past  Today's Vitals   11/20/21 1055  BP: 110/62  Pulse: 96  Temp: 98 F (36.7 C)  TempSrc: Tympanic  SpO2: 98%  Weight: 125 lb 6.4 oz (56.9 kg)  Height: '5\' 2"'$  (1.575 m)   Body mass index is 22.94 kg/m.    Review of Systems  Constitutional: Negative.   HENT: Negative.    Respiratory: Negative.    Cardiovascular: Negative.   Genitourinary: Negative.   Musculoskeletal:  Positive for joint swelling.  Neurological: Negative.        Objective:   Physical Exam HENT:     Head: Normocephalic.  Eyes:     Pupils: Pupils are equal, round, and reactive to light.  Pulmonary:     Effort: Pulmonary effort is normal.  Musculoskeletal:     Right hand: Swelling and tenderness present. No bony tenderness. Normal range of motion.       Hands:     Cervical back: Normal range of motion.     Comments: Swelling to finger as highlighted. She can flex fingers with some tightness secondary to swelling. Distal circulation and sensation intact. Cap refill < 2 seconds.  Small puncture wound without any retention of stinger  No active drainage   Skin:    General: Skin is warm.     Findings: Wound present.  Neurological:     Mental Status: She is alert.           Assessment & Plan:  1. Insect bite of right middle finger, initial encounter Wound cleansed- Administered: - loratadine (CLARITIN) tablet 10 mg - ibuprofen (ADVIL) tablet 200 mg  Advised patient to continue to monitor for any reaction or signs of infection and RTC  as needed

## 2021-11-21 ENCOUNTER — Other Ambulatory Visit: Payer: Self-pay | Admitting: Obstetrics

## 2021-11-21 DIAGNOSIS — N6310 Unspecified lump in the right breast, unspecified quadrant: Secondary | ICD-10-CM

## 2021-12-23 ENCOUNTER — Ambulatory Visit
Admission: RE | Admit: 2021-12-23 | Discharge: 2021-12-23 | Disposition: A | Discharge status: Home / Self Care | Payer: BC Managed Care – PPO | Source: Ambulatory Visit | Attending: Obstetrics | Admitting: Obstetrics

## 2021-12-23 DIAGNOSIS — R922 Inconclusive mammogram: Secondary | ICD-10-CM | POA: Diagnosis not present

## 2021-12-23 DIAGNOSIS — N6310 Unspecified lump in the right breast, unspecified quadrant: Secondary | ICD-10-CM

## 2021-12-23 DIAGNOSIS — N6311 Unspecified lump in the right breast, upper outer quadrant: Secondary | ICD-10-CM | POA: Diagnosis not present

## 2022-04-29 ENCOUNTER — Encounter: Payer: Self-pay | Admitting: Physician Assistant

## 2022-04-29 ENCOUNTER — Ambulatory Visit (INDEPENDENT_AMBULATORY_CARE_PROVIDER_SITE_OTHER): Payer: Self-pay | Admitting: Physician Assistant

## 2022-04-29 ENCOUNTER — Encounter: Payer: Self-pay | Admitting: Gastroenterology

## 2022-04-29 VITALS — BP 120/62 | HR 95 | Temp 97.5°F | Wt 128.0 lb

## 2022-04-29 DIAGNOSIS — J329 Chronic sinusitis, unspecified: Secondary | ICD-10-CM

## 2022-04-29 DIAGNOSIS — J309 Allergic rhinitis, unspecified: Secondary | ICD-10-CM

## 2022-04-29 MED ORDER — PSEUDOEPHEDRINE HCL ER 120 MG PO TB12: 120.0000 mg | ORAL_TABLET | Freq: Two times a day (BID) | ORAL | 0 refills | Status: DC

## 2022-04-29 MED ORDER — MONTELUKAST SODIUM 10 MG PO TABS: 10.0000 mg | ORAL_TABLET | Freq: Every day | ORAL | 3 refills | Status: DC

## 2022-04-29 NOTE — Progress Notes (Signed)
[facility] [address] [place], [place] [address]   Office Visit Note  Patient Name: Linda Tran Date of Birth [id number]  Medical Record number [id number]  Date of Service: 04/29/2022  Chief Complaint  Patient presents with   Sinusitis    Started last Thur. Started sneezing, next morning ST from drainage, migraine that ended yesterday. Cough and teeth hurt. Mucus is "foamy" and clear. No fever or BA. Has not done a COVID test.      62 y/o F presents to the clinic for c/o sneezing, post nasal drainage, nasal congestion, occasional cough and facial pain and sore throat x 5 days. Denies CP, SOB, fever, chills, wheezing. Had tried Nyquil, Flonase, and Zyrtec without much relief.   Sinusitis Associated symptoms include congestion, coughing, sneezing and a sore throat. Pertinent negatives include no shortness of breath.      Current Medication:  Outpatient Encounter Medications as of 04/29/2022  Medication Sig   fluticasone (FLONASE) 50 MCG/ACT nasal spray Place 1 spray into both nostrils daily.   montelukast (SINGULAIR) 10 MG tablet Take 1 tablet (10 mg total) by mouth at bedtime.   Multiple Vitamins-Minerals (WOMENS MULTIVITAMIN PO) Take 1 tablet by mouth daily.   pseudoephedrine (SUDAFED) 120 MG 12 hr tablet Take 1 tablet (120 mg total) by mouth 2 (two) times daily.   meloxicam (MOBIC) 15 MG tablet meloxicam 15 mg tablet  TAKE 1 TABLET BY MOUTH ONCE A DAY (Patient not taking: Reported on 04/29/2022)   methocarbamol (ROBAXIN) 750 MG tablet TAKE 1 TABLET BY MOUTH UP TO 3 TIMES DAILY AS NEEDED   Multiple Vitamin (MULTIVITAMIN ADULT PO) multivitamin (Patient not taking: Reported on 11/20/2021)   Omega-3 Fatty Acids (FISH OIL PO) Take 1,400 mg by mouth. (Patient not taking: Reported on 11/20/2021)   No facility-administered encounter medications on file as of 04/29/2022.      Medical History: Past Medical History:  Diagnosis Date   Allergy    Anemia    Breast cancer  ([facility]) 2013   left breast, DCIS, radiation   Hx of migraine headaches    Personal history of radiation therapy 2013   F/U from lumpectomy for DCIS     Vital Signs: BP 120/62 (BP Location: Left Arm, Patient Position: Sitting, Cuff Size: Normal)   Pulse 95   Temp (!) 97.5 F (36.4 C) (Tympanic)   Wt 128 lb (58.1 kg)   LMP 01/06/2014   SpO2 98%   BMI 23.41 kg/m    Review of Systems  Constitutional: Negative.   HENT:  Positive for congestion, postnasal drip, sinus pain, sneezing and sore throat. Negative for trouble swallowing.   Respiratory:  Positive for cough. Negative for chest tightness, shortness of breath and wheezing.   Cardiovascular: Negative.   Neurological: Negative.     Physical Exam Constitutional:      Appearance: Normal appearance.  HENT:     Head: Atraumatic.     Right Ear: Tympanic membrane, ear canal and external ear normal.     Left Ear: Tympanic membrane, ear canal and external ear normal.     Nose: Nose normal.     Right Turbinates: Enlarged and swollen.     Left Turbinates: Enlarged and swollen.     Mouth/Throat:     Mouth: Mucous membranes are moist.     Pharynx: Oropharynx is clear.  Eyes:     Extraocular Movements: Extraocular movements intact.  Cardiovascular:     Rate and Rhythm: Normal rate and  regular rhythm.  Pulmonary:     Effort: Pulmonary effort is normal.     Breath sounds: Normal breath sounds.  Musculoskeletal:     Cervical back: Neck supple.  Skin:    General: Skin is warm.  Neurological:     Mental Status: She is alert.  Psychiatric:        Mood and Affect: Mood normal.        Behavior: Behavior normal.        Thought Content: Thought content normal.        Judgment: Judgment normal.       Assessment/Plan:  1. Rhinosinusitis - pseudoephedrine (SUDAFED) 120 MG 12 hr tablet; Take 1 tablet (120 mg total) by mouth 2 (two) times daily.  Dispense: 20 tablet; Refill: 0  2. Allergic rhinitis, unspecified seasonality,  unspecified trigger - montelukast (SINGULAIR) 10 MG tablet; Take 1 tablet (10 mg total) by mouth at bedtime.  Dispense: 30 tablet; Refill: 3  Reviewed my clinical findings.  Increase fluids Start a humidifier Start otc oral decongestant ie Sudafed as directed on the box Start Montelukast as prescribed. Continue with Flonase nasal spray Switch Zyrtec to Allegra. Briefly discussed oral steroids if symptoms persist or may consider oral antibiotic. Pt verbalized understanding and in agreement.   General Counseling: texanna hilburn understanding of the findings of todays visit and agrees with plan of treatment. I have discussed any further diagnostic evaluation that may be needed or ordered today. We also reviewed her medications today. she has been encouraged to call the office with any questions or concerns that should arise related to todays visit.    Time spent:20 [place], [place] Physician Assistant

## 2022-05-01 ENCOUNTER — Ambulatory Visit (INDEPENDENT_AMBULATORY_CARE_PROVIDER_SITE_OTHER): Payer: Self-pay | Admitting: Physician Assistant

## 2022-05-01 DIAGNOSIS — J329 Chronic sinusitis, unspecified: Secondary | ICD-10-CM

## 2022-05-01 MED ORDER — METHYLPREDNISOLONE 4 MG PO TBPK: ORAL_TABLET | ORAL | 0 refills | Status: DC

## 2022-05-01 MED ORDER — AMOXICILLIN-POT CLAVULANATE 875-125 MG PO TABS: 1.0000 | ORAL_TABLET | Freq: Two times a day (BID) | ORAL | 0 refills | Status: AC

## 2022-05-01 NOTE — Progress Notes (Signed)
Virtual Visit Consent   Linda Tran, you are scheduled for a virtual visit with a [facility] provider today. Just as with appointments in the office, your consent must be obtained to participate. Your consent will be active for this visit and any virtual visit you may have with one of our providers in the next 365 days. If you have a MyChart account, a copy of this consent can be sent to you electronically.  As this is a virtual telephone visit which doesn't allow your provider to perform a traditional examination and may limit your provider's ability to fully assess your condition. If your provider identifies any concerns that need to be evaluated in person or the need to arrange testing (such as labs, EKG, etc.), we will make arrangements to do so. Although advances in technology are sophisticated, we cannot ensure that it will always work on either your end or our end. If the connection with a video visit is poor, the visit may have to be switched to a telephone visit. With either a video or telephone visit, we are not always able to ensure that we have a secure connection.  By engaging in this virtual visit, you consent to the provision of healthcare and authorize for your insurance to be billed (if applicable) for the services provided during this visit. Depending on your insurance coverage, you may receive a charge related to this service.  I need to obtain your verbal consent now. Are you willing to proceed with your visit today? Linda Tran has provided verbal consent on 05/01/2022 for a virtual visit (video or telephone). Linda Tran, [place]  Date: 05/01/2022 8:03 AM  Virtual Visit via Telephone Note   I, Linda Tran, connected with  Linda Tran  ([id number], 06-28-1960) on 05/01/22 at  8:00 AM EST by a telephone and verified that I am speaking with the correct person using two identifiers.  Location: Patient: Virtual Visit Location Patient: Home Provider: Virtual Visit Location  Provider: Office/Clinic   I discussed the limitations of evaluation and management by telemedicine and the availability of in person appointments. The patient expressed understanding and agreed to proceed.    History of Present Illness: Linda Tran is a 62 y.o. who identifies as a female who was assigned female at birth, and is being seen today for sinus pressure, pain, and dental pain on the right.  HPI: 62 y/o F follows up sinus pain, pressure, and right sided dental pain via telehealth. She was seen two days ago for the same and was recommended to try Sudafed for her symptoms. She wasn't able to tolerate Sudafed due to increased HR and unable to sleep. Currently denies fever or chills. No CP, SOB.   Sinus Problem    Problems:  Patient Active Problem List   Diagnosis Date Noted   Atherosclerosis of artery 04/20/2018   Hx of breast cancer 03/09/2018   Tobacco abuse 03/09/2018   Edema 02/19/2018   Cyst possible abcess skin  near coccyx- right side  02/19/2018   Recurrent joint pain 02/19/2018   Erythema- coccyx skin surronding cyst/ abscess  02/19/2018   Pain of left hand 06/03/2017   Acquired trigger finger of left middle finger 05/06/2017   Hand injury, left, sequela 03/12/2017   Anemia 10/31/2016   Suspected exposure to mold 10/31/2016   Takes iron supplements 10/23/2016    Allergies:  Allergies  Allergen Reactions   Bactrim [Sulfamethoxazole-Trimethoprim] Rash    Rash on abdomen   Latex  Rash   Medications:  Current Outpatient Medications:    amoxicillin-clavulanate (AUGMENTIN) 875-125 MG tablet, Take 1 tablet by mouth 2 (two) times daily for 7 days., Disp: 14 tablet, Rfl: 0   methylPREDNISolone (MEDROL DOSEPAK) 4 MG TBPK tablet, Take as directed, Disp: 1 each, Rfl: 0   fluticasone (FLONASE) 50 MCG/ACT nasal spray, Place 1 spray into both nostrils daily., Disp: 16 g, Rfl: 1   meloxicam (MOBIC) 15 MG tablet, meloxicam 15 mg tablet  TAKE 1 TABLET BY MOUTH ONCE A DAY  (Patient not taking: Reported on 04/29/2022), Disp: , Rfl:    methocarbamol (ROBAXIN) 750 MG tablet, TAKE 1 TABLET BY MOUTH UP TO 3 TIMES DAILY AS NEEDED, Disp: 90 tablet, Rfl: 3   montelukast (SINGULAIR) 10 MG tablet, Take 1 tablet (10 mg total) by mouth at bedtime., Disp: 30 tablet, Rfl: 3   Multiple Vitamin (MULTIVITAMIN ADULT PO), multivitamin (Patient not taking: Reported on 11/20/2021), Disp: , Rfl:    Multiple Vitamins-Minerals (WOMENS MULTIVITAMIN PO), Take 1 tablet by mouth daily., Disp: , Rfl:    Omega-3 Fatty Acids (FISH OIL PO), Take 1,400 mg by mouth. (Patient not taking: Reported on 11/20/2021), Disp: , Rfl:    pseudoephedrine (SUDAFED) 120 MG 12 hr tablet, Take 1 tablet (120 mg total) by mouth 2 (two) times daily., Disp: 20 tablet, Rfl: 0  Observations/Objective: Patient is well-developed, well-nourished in no acute distress.  Resting comfortably at home.  Head is normocephalic, atraumatic.  No labored breathing.  Speech is clear and coherent with logical content.  Patient is alert and oriented at baseline.    Assessment and Plan: 1. Rhinosinusitis - methylPREDNISolone (MEDROL DOSEPAK) 4 MG TBPK tablet; Take as directed  Dispense: 1 each; Refill: 0 - amoxicillin-clavulanate (AUGMENTIN) 875-125 MG tablet; Take 1 tablet by mouth 2 (two) times daily for 7 days.  Dispense: 14 tablet; Refill: 0  Pt unable to tolerate Sudafed due to racing HR and unable to sleep. Discontinue medicine. Start new medications as prescribed Continue to watch for worsening symptoms. RTC prn. Pt verbalized understanding and in agreement.    Follow Up Instructions: I discussed the assessment and treatment plan with the patient. The patient was provided an opportunity to ask questions and all were answered. The patient agreed with the plan and demonstrated an understanding of the instructions.  A copy of instructions were sent to the patient via MyChart unless otherwise noted below.    The patient was  advised to call back or seek an in-person evaluation if the symptoms worsen or if the condition fails to improve as anticipated.  Time:  I spent 10 minutes with the patient via telehealth technology discussing the above problems/concerns.    Linda Acosta, PA-C

## 2022-05-06 DIAGNOSIS — B351 Tinea unguium: Secondary | ICD-10-CM | POA: Diagnosis not present

## 2022-05-06 DIAGNOSIS — L84 Corns and callosities: Secondary | ICD-10-CM | POA: Diagnosis not present

## 2022-05-06 DIAGNOSIS — D2262 Melanocytic nevi of left upper limb, including shoulder: Secondary | ICD-10-CM | POA: Diagnosis not present

## 2022-05-06 DIAGNOSIS — L853 Xerosis cutis: Secondary | ICD-10-CM | POA: Diagnosis not present

## 2022-05-06 DIAGNOSIS — D2261 Melanocytic nevi of right upper limb, including shoulder: Secondary | ICD-10-CM | POA: Diagnosis not present

## 2022-07-08 ENCOUNTER — Encounter: Payer: Self-pay | Admitting: Gastroenterology

## 2022-08-05 DIAGNOSIS — B351 Tinea unguium: Secondary | ICD-10-CM | POA: Diagnosis not present

## 2022-08-05 DIAGNOSIS — R208 Other disturbances of skin sensation: Secondary | ICD-10-CM | POA: Diagnosis not present

## 2022-08-05 DIAGNOSIS — L84 Corns and callosities: Secondary | ICD-10-CM | POA: Diagnosis not present

## 2022-08-27 DIAGNOSIS — Z01419 Encounter for gynecological examination (general) (routine) without abnormal findings: Secondary | ICD-10-CM | POA: Diagnosis not present

## 2022-08-27 DIAGNOSIS — Z6823 Body mass index (BMI) 23.0-23.9, adult: Secondary | ICD-10-CM | POA: Diagnosis not present

## 2022-08-27 DIAGNOSIS — R3915 Urgency of urination: Secondary | ICD-10-CM | POA: Diagnosis not present

## 2022-09-09 ENCOUNTER — Encounter: Payer: BC Managed Care – PPO | Admitting: Gastroenterology

## 2022-09-12 ENCOUNTER — Other Ambulatory Visit: Payer: Self-pay | Admitting: Obstetrics

## 2022-09-12 DIAGNOSIS — Z1231 Encounter for screening mammogram for malignant neoplasm of breast: Secondary | ICD-10-CM

## 2022-09-18 ENCOUNTER — Other Ambulatory Visit: Payer: Self-pay | Admitting: Adult Health

## 2022-09-18 DIAGNOSIS — J309 Allergic rhinitis, unspecified: Secondary | ICD-10-CM

## 2022-09-18 MED ORDER — MONTELUKAST SODIUM 10 MG PO TABS: 10.0000 mg | ORAL_TABLET | Freq: Every day | ORAL | 3 refills | Status: DC

## 2022-09-18 NOTE — Progress Notes (Signed)
Refilled Singulair. 

## 2022-09-19 ENCOUNTER — Ambulatory Visit (INDEPENDENT_AMBULATORY_CARE_PROVIDER_SITE_OTHER): Payer: Self-pay | Admitting: Adult Health

## 2022-09-19 VITALS — HR 96 | Temp 98.2°F

## 2022-09-19 DIAGNOSIS — H5712 Ocular pain, left eye: Secondary | ICD-10-CM

## 2022-09-19 NOTE — Progress Notes (Signed)
[facility] [address] [place], [place] [address]   Office Visit Note  Patient Name: Linda Tran Date of Birth [id number]  Medical Record number [id number]  Date of Service: 09/19/2022  Chief Complaint  Patient presents with   Eye Pain     Eye Pain  Associated symptoms include nausea and photophobia. Pertinent negatives include no eye discharge, eye redness, fever or itching.   Pt is here for acute visit.  She reports her left eye felt "pressured" last night before bed.  This morning when she woke up it was red and a little puffy under the eye.  She put her contact in, and drove to work.  As the morning progressed, and the sun came up, she reports the eye started "Throbbing". She denies any acute visual disturbance but says "Something isn't right", and she is very photosensitive at this time. She states she feels "almost nauseated because of the eye". She reports having pink eye a few years ago, and this feels "similar but different" Denies halo around lights. 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    Current Medication:  Outpatient Encounter Medications as of 09/19/2022  Medication Sig   fluticasone (FLONASE) 50 MCG/ACT nasal spray Place 1 spray into both nostrils daily.   meloxicam (MOBIC) 15 MG tablet meloxicam 15 mg tablet  TAKE 1 TABLET BY MOUTH ONCE A DAY (Patient not taking: Reported on 04/29/2022)   methocarbamol (ROBAXIN) 750 MG tablet TAKE 1 TABLET BY MOUTH UP TO 3 TIMES DAILY AS NEEDED   methylPREDNISolone (MEDROL DOSEPAK) 4 MG TBPK tablet Take as directed   montelukast (SINGULAIR) 10 MG tablet Take 1 tablet (10 mg total) by mouth at bedtime.   Multiple Vitamin (MULTIVITAMIN ADULT PO) multivitamin (Patient not taking: Reported on 11/20/2021)   Multiple Vitamins-Minerals (WOMENS MULTIVITAMIN PO) Take 1 tablet by mouth daily.   Omega-3 Fatty Acids (FISH OIL PO) Take 1,400 mg by mouth. (Patient not taking: Reported on 11/20/2021)   No  facility-administered encounter medications on file as of 09/19/2022.      Medical History: Past Medical History:  Diagnosis Date   Allergy    Anemia    Breast cancer (HCC) 2013   left breast, DCIS, radiation   Hx of migraine headaches    Personal history of radiation therapy 2013   F/U from lumpectomy for DCIS     Vital Signs: Pulse 96   Temp 98.2 F (36.8 C)   LMP 01/06/2014   SpO2 100%    Review of Systems  Constitutional:  Negative for chills and fever.  HENT:  Negative for ear pain, sinus pressure and sore throat.   Eyes:  Positive for photophobia and pain. Negative for discharge, redness and itching.  Respiratory:  Negative for cough.   Gastrointestinal:  Positive for nausea.    Physical Exam Vitals reviewed.  Constitutional:      Appearance: Normal appearance.  HENT:     Head: Normocephalic.  Eyes:     General: Lids are normal.        Left eye: No foreign body, discharge or hordeolum.     Extraocular Movements:     Left eye: Normal extraocular motion and no nystagmus.     Conjunctiva/sclera:     Left eye: Left conjunctiva is not injected. No chemosis, exudate or hemorrhage. Neurological:     Mental Status: She is alert.    Assessment/Plan: 1. Pain of left eye Exam benign, however given  acute onset of symptoms, nausea, and photosensitivity I have advised her to call her eye doctor to be seen today. If she is unable to be seen, she is urged to go to ED, or another eye doctor such as [facility].       General Counseling: shaneisha burkel understanding of the findings of todays visit and agrees with plan of treatment. I have discussed any further diagnostic evaluation that may be needed or ordered today. We also reviewed her medications today. she has been encouraged to call the office with any questions or concerns that should arise related to todays visit.   No orders of the defined types were placed in this encounter.   No orders of the  defined types were placed in this encounter.   Time spent:20 Minutes    [name] AGNP-C Nurse Practitioner

## 2022-10-20 ENCOUNTER — Encounter: Payer: BC Managed Care – PPO | Admitting: Gastroenterology

## 2022-11-04 DIAGNOSIS — R208 Other disturbances of skin sensation: Secondary | ICD-10-CM | POA: Diagnosis not present

## 2022-11-04 DIAGNOSIS — D485 Neoplasm of uncertain behavior of skin: Secondary | ICD-10-CM | POA: Diagnosis not present

## 2022-11-04 DIAGNOSIS — B078 Other viral warts: Secondary | ICD-10-CM | POA: Diagnosis not present

## 2022-11-04 DIAGNOSIS — B351 Tinea unguium: Secondary | ICD-10-CM | POA: Diagnosis not present

## 2022-11-04 DIAGNOSIS — L538 Other specified erythematous conditions: Secondary | ICD-10-CM | POA: Diagnosis not present

## 2023-01-23 ENCOUNTER — Ambulatory Visit
Admission: RE | Admit: 2023-01-23 | Discharge: 2023-01-23 | Disposition: A | Discharge status: Home / Self Care | Payer: BC Managed Care – PPO | Source: Ambulatory Visit | Attending: Obstetrics | Admitting: Obstetrics

## 2023-01-23 ENCOUNTER — Encounter: Payer: Self-pay | Admitting: Family Medicine

## 2023-01-23 DIAGNOSIS — Z1231 Encounter for screening mammogram for malignant neoplasm of breast: Secondary | ICD-10-CM

## 2023-01-27 ENCOUNTER — Other Ambulatory Visit: Payer: Self-pay | Admitting: Obstetrics

## 2023-01-27 DIAGNOSIS — N644 Mastodynia: Secondary | ICD-10-CM

## 2023-02-10 ENCOUNTER — Ambulatory Visit
Admission: RE | Admit: 2023-02-10 | Discharge: 2023-02-10 | Disposition: A | Discharge status: Home / Self Care | Payer: BC Managed Care – PPO | Source: Ambulatory Visit | Attending: Obstetrics

## 2023-02-10 ENCOUNTER — Ambulatory Visit
Admission: RE | Admit: 2023-02-10 | Discharge: 2023-02-10 | Disposition: A | Discharge status: Home / Self Care | Payer: BC Managed Care – PPO | Source: Ambulatory Visit | Attending: Obstetrics | Admitting: Obstetrics

## 2023-02-10 DIAGNOSIS — N644 Mastodynia: Secondary | ICD-10-CM

## 2023-05-12 DIAGNOSIS — D2261 Melanocytic nevi of right upper limb, including shoulder: Secondary | ICD-10-CM | POA: Diagnosis not present

## 2023-05-12 DIAGNOSIS — D2262 Melanocytic nevi of left upper limb, including shoulder: Secondary | ICD-10-CM | POA: Diagnosis not present

## 2023-05-12 DIAGNOSIS — D2272 Melanocytic nevi of left lower limb, including hip: Secondary | ICD-10-CM | POA: Diagnosis not present

## 2023-05-12 DIAGNOSIS — D225 Melanocytic nevi of trunk: Secondary | ICD-10-CM | POA: Diagnosis not present

## 2023-07-10 ENCOUNTER — Other Ambulatory Visit (HOSPITAL_COMMUNITY): Payer: Self-pay

## 2023-10-21 ENCOUNTER — Other Ambulatory Visit: Payer: Self-pay

## 2023-10-21 ENCOUNTER — Ambulatory Visit (INDEPENDENT_AMBULATORY_CARE_PROVIDER_SITE_OTHER): Payer: Self-pay | Admitting: Oncology

## 2023-10-21 ENCOUNTER — Encounter: Payer: Self-pay | Admitting: Oncology

## 2023-10-21 VITALS — BP 104/80 | HR 107 | Temp 98.1°F | Ht 62.0 in | Wt 124.0 lb

## 2023-10-21 DIAGNOSIS — R309 Painful micturition, unspecified: Secondary | ICD-10-CM

## 2023-10-21 DIAGNOSIS — K130 Diseases of lips: Secondary | ICD-10-CM

## 2023-10-21 LAB — POCT URINALYSIS DIPSTICK (MANUAL)
Leukocytes, UA: NEGATIVE
Nitrite, UA: NEGATIVE
Poct Bilirubin: NEGATIVE
Poct Blood: NEGATIVE
Poct Glucose: NORMAL mg/dL
Poct Ketones: NEGATIVE
Poct Protein: NEGATIVE mg/dL
Poct Urobilinogen: NORMAL mg/dL
Spec Grav, UA: 1.015 (ref 1.010–1.025)
pH, UA: 5 (ref 5.0–8.0)

## 2023-10-21 MED ORDER — TRIAMCINOLONE ACETONIDE 0.025 % EX OINT: 1.0000 | TOPICAL_OINTMENT | Freq: Two times a day (BID) | CUTANEOUS | 0 refills | Status: DC

## 2023-10-21 MED ORDER — FLUCONAZOLE 150 MG PO TABS: 150.0000 mg | ORAL_TABLET | ORAL | 0 refills | Status: DC | PRN

## 2023-10-21 MED ORDER — NITROFURANTOIN MONOHYD MACRO 100 MG PO CAPS: 100.0000 mg | ORAL_CAPSULE | Freq: Two times a day (BID) | ORAL | 0 refills | Status: DC

## 2023-10-21 MED ORDER — MICONAZOLE NITRATE 2 % EX CREA
1.0000 | TOPICAL_CREAM | Freq: Two times a day (BID) | CUTANEOUS | 0 refills | Status: DC
Start: 2023-10-21 — End: 2024-02-16

## 2023-10-21 NOTE — Progress Notes (Signed)
 [facility]  [address] [place], [place] [address] Phone: [phone] Fax: [phone]   Office Visit Note  Patient Name: Linda Tran  Date of Apmuy:[id number]  Med Rec number [id number]  Date of Service: 10/21/2023  Bactrim  [sulfamethoxazole -trimethoprim ] and Latex  Chief Complaint  Patient presents with   Acute Visit    Patient reports feeling a stabbing pain when urinating. Symptoms began about one week ago and have been becoming more frequent over time. She has been drinking cranberry juice. She is also concerned about cracking in the corner of the R side of her lips. She has been applying Aquaphor to the area.   HPI Patient is an 63 y.o. who is here for possible kidney infection. Sx started about a week ago. Has gynecology appt in 2 weeks but felt like that was too long to wait. Has not had a UTI in a long time.  Denies increased urgency or frequency of urination.  No dysuria and no flank pain.  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  Denies any hematuria.  She is currently not sexually active.  Reports symptoms feel similar to previous UTIs although it has been a long time.  No allergies to medications.  Reports persistent right sided cracking to the corner of her mouth.  This has been on and off for the past several months.  She has tried Aquaphor, Carmex and Vaseline.  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  Reports it is painful and annoying.   Current Medication:  Outpatient Encounter Medications as of 10/21/2023  Medication Sig   Collagen-Vitamin C -Biotin (COLLAGEN PO) Take by mouth daily.   fluconazole  (DIFLUCAN ) 150 MG tablet Take 1 tablet (150 mg total) by mouth every three (3) days as needed.   fluticasone  (FLONASE ) 50 MCG/ACT nasal spray Place 1 spray into both nostrils daily. (Patient taking differently: Place 1 spray  into both nostrils daily as needed.)   methocarbamol  (ROBAXIN ) 750 MG tablet TAKE 1 TABLET BY MOUTH UP TO 3 TIMES DAILY AS NEEDED   miconazole  (MICOTIN) 2 % cream Apply 1 Application topically 2 (two) times daily.   montelukast  (SINGULAIR ) 10 MG tablet Take 1 tablet (10 mg total) by mouth at bedtime. (Patient taking differently: Take 10 mg by mouth at bedtime as needed.)   Multiple Vitamin (MULTIVITAMIN ADULT PO) multivitamin   nitrofurantoin , macrocrystal-monohydrate, (MACROBID ) 100 MG capsule Take 1 capsule (100 mg total) by mouth 2 (two) times daily.   triamcinolone  (KENALOG ) 0.025 % ointment Apply 1 Application topically 2 (two) times daily.   [DISCONTINUED] meloxicam (MOBIC) 15 MG tablet meloxicam 15 mg tablet  TAKE 1 TABLET BY MOUTH ONCE A DAY (Patient not taking: Reported on 04/29/2022)   [DISCONTINUED] methylPREDNISolone  (MEDROL  DOSEPAK) 4 MG TBPK tablet Take as directed   [DISCONTINUED] Multiple Vitamins-Minerals (WOMENS MULTIVITAMIN PO) Take 1 tablet by mouth daily.   [DISCONTINUED] Omega-3 Fatty Acids (FISH OIL PO) Take 1,400 mg by mouth. (Patient not taking: Reported on 11/20/2021)   No facility-administered encounter medications on file as of 10/21/2023.    Medical History: Past Medical History:  Diagnosis Date   Allergy    Anemia    Breast cancer (HCC) 2013   left breast, DCIS, radiation   Hx of migraine headaches    Personal history of radiation therapy 2013   F/U from lumpectomy  for DCIS     Vital Signs: BP 104/80   Pulse (!) 107   Temp 98.1 F (36.7 C)   Ht 5' 2 (1.575 m)   Wt 124 lb (56.2 kg)   LMP 01/06/2014   SpO2 97%   BMI 22.68 kg/m   ROS: As per HPI.  All other pertinent ROS negative.     Review of Systems  Constitutional:  Negative for fatigue.  HENT:  Negative for congestion, postnasal drip and sore throat.   Genitourinary:  Negative for decreased urine volume, dysuria, frequency and urgency.       Stabbing pain with urination  Musculoskeletal:   Negative for arthralgias, back pain, gait problem and myalgias.  Skin:  Positive for rash (corner of right side of mouth).  Neurological:  Negative for dizziness and headaches.    Physical Exam Constitutional:      Appearance: Normal appearance.  Neurological:     Mental Status: She is alert.    Results for orders placed or performed in visit on 10/21/23 (from the past 24 hours)  POCT Urinalysis Dip Manual     Status: None   Collection Time: 10/21/23  9:12 AM  Result Value Ref Range   Spec Grav, UA 1.015 1.010 - 1.025   pH, UA 5.0 5.0 - 8.0   Leukocytes, UA Negative Negative   Nitrite, UA Negative Negative   Poct Protein Negative Negative, trace mg/dL   Poct Glucose Normal Normal mg/dL   Poct Ketones Negative Negative   Poct Urobilinogen Normal Normal mg/dL   Poct Bilirubin Negative Negative   Poct Blood Negative Negative, trace    Assessment/Plan: 1. Pain with urination (Primary) Urinalysis is not consistent with a UTI.  Will send for culture.  Symptoms feel similar to previous UTIs.  No recent antibiotics.  We discussed Bactrim  twice daily x 7 days although encouraged her to wait until culture returns.  If symptoms progressively worsen, go ahead and start antibiotic.  - POCT Urinalysis Dip Manual - nitrofurantoin , macrocrystal-monohydrate, (MACROBID ) 100 MG capsule; Take 1 capsule (100 mg total) by mouth 2 (two) times daily.  Dispense: 14 capsule; Refill: 0 - Urine Culture  2. Angular cheilitis We discussed creams versus oral antifungal agents.  She has tried emollient such as Aquaphor, Vaseline and Carmex.  Recommend antifungal cream and steroid cream.  She can try Diflucan  x 2 doses.  Take 1 dose today and repeat in 3 days if no improvement of your symptoms.  You may use creams as needed.  - miconazole  (MICOTIN) 2 % cream; Apply 1 Application topically 2 (two) times daily.  Dispense: 28.35 g; Refill: 0 - triamcinolone  (KENALOG ) 0.025 % ointment; Apply 1 Application  topically 2 (two) times daily.  Dispense: 30 g; Refill: 0 - fluconazole  (DIFLUCAN ) 150 MG tablet; Take 1 tablet (150 mg total) by mouth every three (3) days as needed.  Dispense: 3 tablet; Refill: 0   General Counseling: daylen hack understanding of the findings of todays visit and agrees with plan of treatment. I have discussed any further diagnostic evaluation that may be needed or ordered today. We also reviewed her medications today. she has been encouraged to call the office with any questions or concerns that should arise related to todays visit.   Orders Placed This Encounter  Procedures   Urine Culture   POCT Urinalysis Dip Manual    Meds ordered this encounter  Medications   nitrofurantoin , macrocrystal-monohydrate, (MACROBID ) 100 MG capsule    Sig: Take 1 capsule (  100 mg total) by mouth 2 (two) times daily.    Dispense:  14 capsule    Refill:  0   miconazole  (MICOTIN) 2 % cream    Sig: Apply 1 Application topically 2 (two) times daily.    Dispense:  28.35 g    Refill:  0   triamcinolone  (KENALOG ) 0.025 % ointment    Sig: Apply 1 Application topically 2 (two) times daily.    Dispense:  30 g    Refill:  0   fluconazole  (DIFLUCAN ) 150 MG tablet    Sig: Take 1 tablet (150 mg total) by mouth every three (3) days as needed.    Dispense:  3 tablet    Refill:  0    I spent 20 minutes dedicated to the care of this patient (face-to-face and non-face-to-face) on the date of the encounter to include what is described in the assessment and plan.   Delon Hope, NP 10/21/2023 1:27 PM

## 2023-10-23 ENCOUNTER — Ambulatory Visit: Payer: Self-pay | Admitting: Oncology

## 2023-10-23 LAB — URINE CULTURE

## 2023-11-02 DIAGNOSIS — Z01419 Encounter for gynecological examination (general) (routine) without abnormal findings: Secondary | ICD-10-CM | POA: Diagnosis not present

## 2024-02-08 ENCOUNTER — Other Ambulatory Visit: Payer: Self-pay | Admitting: Obstetrics

## 2024-02-08 DIAGNOSIS — Z1231 Encounter for screening mammogram for malignant neoplasm of breast: Secondary | ICD-10-CM

## 2024-02-16 ENCOUNTER — Ambulatory Visit: Admitting: Obstetrics and Gynecology

## 2024-02-16 ENCOUNTER — Encounter: Payer: Self-pay | Admitting: Obstetrics and Gynecology

## 2024-02-16 VITALS — BP 108/69 | HR 103 | Ht 62.0 in | Wt 126.0 lb

## 2024-02-16 DIAGNOSIS — R3981 Functional urinary incontinence: Secondary | ICD-10-CM

## 2024-02-16 DIAGNOSIS — N393 Stress incontinence (female) (male): Secondary | ICD-10-CM

## 2024-02-16 DIAGNOSIS — N952 Postmenopausal atrophic vaginitis: Secondary | ICD-10-CM | POA: Diagnosis not present

## 2024-02-16 DIAGNOSIS — N3281 Overactive bladder: Secondary | ICD-10-CM

## 2024-02-16 LAB — POC URINALSYSI DIPSTICK (AUTOMATED)
Bilirubin, UA: NEGATIVE
Blood, UA: NEGATIVE
Glucose, UA: NEGATIVE
Ketones, UA: NEGATIVE
Leukocytes, UA: NEGATIVE
Nitrite, UA: NEGATIVE
Protein, UA: NEGATIVE
Spec Grav, UA: 1.01 (ref 1.010–1.025)
Urobilinogen, UA: 0.2 U/dL
pH, UA: 6 (ref 5.0–8.0)

## 2024-02-16 MED ORDER — VIBEGRON 75 MG PO TABS: 75.0000 mg | ORAL_TABLET | Freq: Every day | ORAL | 5 refills | Status: DC

## 2024-02-16 MED ORDER — ESTRADIOL 0.01 % VA CREA: 0.5000 g | TOPICAL_CREAM | VAGINAL | 11 refills | Status: DC

## 2024-02-16 NOTE — Progress Notes (Signed)
 [facility] New Patient Evaluation and Consultation  Referring Provider: Gretta Gums, MD PCP: Gretta Gums, MD Date of Service: 02/16/2024  SUBJECTIVE Chief Complaint: New Patient (Initial Visit) Linda Tran is a 63 y.o. female here for urinary incontinence/)  History of Present Illness: Linda Tran is a 63 y.o. White or Caucasian female seen in consultation at the request of Dr. Gretta for evaluation of SUI.    Review of records significant for: Active smoker Hx breast cancer Has done pelvic floor PT previously without significant improvement.   Urinary Symptoms: Leaks urine with cough/ sneeze, laughing, exercise, lifting, with urgency, and while asleep Leaks 3-4 time(s) per days.  Pad use: 4 pads per day.   Patient is bothered by UI symptoms.  Day time voids Varies.  Nocturia: 1 times per night to void. Voiding dysfunction:  empties bladder well.  Patient does not use a catheter to empty bladder.  When urinating, patient feels dribbling after finishing and the need to urinate multiple times in a row Drinks: Coffee, Sweet tea, Water per day  UTIs: 0 UTI's in the last year.   Denies history of urologic concerns.  No results found for the last 90 days.   Pelvic Organ Prolapse Symptoms:                  Patient Denies a feeling of a bulge the vaginal area.   Bowel Symptom: Bowel movements: 2-3 time(s) per day Stool consistency: hard or soft  Straining: no.  Splinting: no.  Incomplete evacuation: no.  Patient Denies accidental bowel leakage / fecal incontinence Bowel regimen: none Last colonoscopy: Date 2018, Results WNL HM Colonoscopy          Current Care Gaps     Colonoscopy (Every 5 Years) Overdue since 01/20/2022    01/20/2017  COLONOSCOPY   Only the first 1 history entries have been loaded, but more history exists.                Sexual Function Sexually active: no.  Sexual orientation: Straight Pain with sex: No  Pelvic  Pain Denies pelvic pain   Past Medical History:  Past Medical History:  Diagnosis Date   Allergy    Anemia    Breast cancer (HCC) 2013   left breast, DCIS, radiation   Hx of migraine headaches    Personal history of radiation therapy 2013   F/U from lumpectomy for DCIS     Past Surgical History:   Past Surgical History:  Procedure Laterality Date   BREAST BIOPSY Left 01/12/12   positive,DCIS   BREAST BIOPSY Right 2012   negative   BREAST BIOPSY Right 2017   2 MM INTRADUCTAL PAPILLOMA.    BREAST CYST ASPIRATION Left negative   BREAST EXCISIONAL BIOPSY Left 01/19/12   positve, DCIS   BREAST LUMPECTOMY Left 2013   DCIS, lumpectomy F/U radiation    BREAST SURGERY     CESAREAN SECTION     COLONOSCOPY     POLYPECTOMY     hyperplastic polyps   TUBAL LIGATION       Past OB/GYN History: G1P1001 Vaginal deliveries: 0,  Forceps/ Vacuum deliveries: 0, Cesarean section: 1 Menopausal: Yes Contraception: N/a. Last pap smear was unknown HM PAP   This patient has no relevant Health Maintenance data.     Medications: Patient has a current medication list which includes the following prescription(s): collagen-vitamin c -biotin, [START ON 02/18/2024] estradiol, melatonin, methocarbamol , multiple vitamin, and vibegron.   Allergies:  Patient is allergic to bactrim  [sulfamethoxazole -trimethoprim ] and latex.   Social History:  Social History   Tobacco Use   Smoking status: Every Day    Current packs/day: 1.00    Average packs/day: 1 pack/day for 35.8 years (35.8 ttl pk-yrs)    Types: Cigarettes    Start date: 36   Smokeless tobacco: Never  Vaping Use   Vaping status: Never Used  Substance Use Topics   Alcohol use: Yes    Comment: social, occasional   Drug use: No    Relationship status: divorced Patient lives with her son.   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 Regular exercise: Yes: Active job History of abuse: No  Family History:    Family History  Problem Relation Age of Onset   Arthritis Mother    Heart disease Father 3       AMI   Arthritis Sister    Kidney disease Sister    Learning disabilities Sister    Hypertension Sister    Hyperlipidemia Sister    Colon cancer Neg Hx    Colon polyps Neg Hx    Stomach cancer Neg Hx    Rectal cancer Neg Hx    Esophageal cancer Neg Hx    Breast cancer Neg Hx      Review of Systems: Review of Systems  Constitutional:  Negative for chills and fever.  Respiratory:  Negative for cough and shortness of breath.   Cardiovascular:  Negative for chest pain and palpitations.  Gastrointestinal:  Negative for abdominal pain, blood in stool, constipation and diarrhea.  Skin:  Negative for rash.  Neurological:  Negative for weakness.  Psychiatric/Behavioral:  Negative for depression and suicidal ideas.      OBJECTIVE Physical Exam: Vitals:   02/16/24 1352  BP: 108/69  Pulse: (!) 103  Weight: 126 lb (57.2 kg)  Height: 5' 2 (1.575 m)    Physical Exam   GU / Detailed Urogynecologic Evaluation:  Pelvic Exam: Normal external female genitalia; Bartholin's and Skene's glands normal in appearance; urethral meatus normal in appearance, no urethral masses or discharge.   CST: positive  Speculum exam reveals normal vaginal mucosa with atrophy. Cervix normal appearance. Uterus normal single, nontender. Adnexa normal adnexa.    With apex supported, anterior compartment defect was reduced  Pelvic floor strength I/V Pelvic floor musculature: Right levator non-tender, Right obturator non-tender, Left levator non-tender, Left obturator non-tender  POP-Q:   POP-Q  -2.5                                            Aa   -2.5                                           Ba  -5                                              C   3  Gh  3.5                                            Pb  7                                            tvl    -3                                            Ap  -3                                            Bp  -6                                              D      Rectal Exam:  Normal external exam.   Post-Void Residual (PVR) by Bladder Scan: In order to evaluate bladder emptying, we discussed obtaining a postvoid residual and patient agreed to this procedure.  Procedure: The ultrasound unit was placed on the patient's abdomen in the suprapubic region after the patient had voided.    Post Void Residual - 02/16/24 1411       Post Void Residual   Post Void Residual 82 mL           Laboratory Results: Lab Results  Component Value Date   COLORU yellow 02/16/2024   CLARITYU clear 02/16/2024   GLUCOSEUR Negative 02/16/2024   BILIRUBINUR neg 02/16/2024   KETONESU neg 02/16/2024   SPECGRAV 1.010 02/16/2024   RBCUR neg 02/16/2024   PHUR 6.0 02/16/2024   PROTEINUR Negative 02/16/2024   UROBILINOGEN 0.2 02/16/2024   LEUKOCYTESUR Negative 02/16/2024    Lab Results  Component Value Date   CREATININE 0.50 (L) 11/27/2020   CREATININE 0.68 02/19/2018   CREATININE 0.60 10/22/2016    Lab Results  Component Value Date   HGBA1C 6.0 (H) 11/27/2020    Lab Results  Component Value Date   HGB 10.7 (L) 11/27/2020     ASSESSMENT AND PLAN Ms. Garciagarcia is a 63 y.o. with:  1. Functional urinary incontinence   2. OAB (overactive bladder)   3. SUI (stress urinary incontinence, female)   4. Vaginal atrophy    We discussed the symptoms of overactive bladder (OAB), which include urinary urgency, urinary frequency, nocturia, with or without urge incontinence.  While we do not know the exact etiology of OAB, several treatment options exist. We discussed management including behavioral therapy (decreasing bladder irritants, urge suppression strategies, timed voids, bladder retraining), physical therapy, medication;For anticholinergic medications, we discussed the potential side effects of  anticholinergics including dry eyes, dry mouth, constipation, cognitive impairment and urinary retention. For Beta-3 agonist medication, we discussed the potential side effect of elevated blood pressure which is more likely to occur in individuals with uncontrolled hypertension. Patient has a component of OAB in her nocturia. No hx of sleep  apnea, but she is a smoker. Will see if a beta 3 agonist makes a difference in her urgency and nighttime loss of urine. Gemtesa 75mg  daily.  Patient has obvious SUI on exam with multiple leaks with change in position, valsalva, and cough.  Discussed options of pessary, urethral bulking and surgical sling. Patient is interested most in UB. Patient to be scheduled for Urethral bulking once the new year scheduled is opened and we can confirm cost for her.  Patient has vaginal atrophy on exam. She would benefit from estrogen cream. Patient to use a blueberry sized amount into the vagina. She may use this nightly for 2 weeks and then twice weekly after. We discussed using her finger instead of using the applicator.   Patient to return for urethral bulking.     Salvadore Valvano G Shray Hunley, NP

## 2024-02-17 ENCOUNTER — Telehealth (HOSPITAL_BASED_OUTPATIENT_CLINIC_OR_DEPARTMENT_OTHER): Payer: Self-pay

## 2024-02-17 NOTE — Telephone Encounter (Addendum)
 Notified Patient that her Prior Authorization for Gemtesa was approved and good until 02/16/2025.

## 2024-03-07 ENCOUNTER — Ambulatory Visit
Admission: RE | Admit: 2024-03-07 | Discharge: 2024-03-07 | Disposition: A | Discharge status: Home / Self Care | Source: Ambulatory Visit | Attending: Obstetrics | Admitting: Obstetrics

## 2024-03-07 DIAGNOSIS — Z1231 Encounter for screening mammogram for malignant neoplasm of breast: Secondary | ICD-10-CM | POA: Diagnosis not present

## 2024-04-11 ENCOUNTER — Encounter: Payer: Self-pay | Admitting: Medical

## 2024-04-11 ENCOUNTER — Other Ambulatory Visit: Payer: Self-pay

## 2024-04-11 ENCOUNTER — Ambulatory Visit: Payer: Self-pay | Admitting: Medical

## 2024-04-11 VITALS — BP 116/80 | HR 104 | Temp 97.7°F | Ht 62.0 in | Wt 124.0 lb

## 2024-04-11 DIAGNOSIS — H6992 Unspecified Eustachian tube disorder, left ear: Secondary | ICD-10-CM

## 2024-04-11 DIAGNOSIS — J309 Allergic rhinitis, unspecified: Secondary | ICD-10-CM

## 2024-04-11 MED ORDER — FLUTICASONE PROPIONATE 50 MCG/ACT NA SUSP: 2.0000 | Freq: Every day | NASAL | 3 refills | Status: AC

## 2024-04-11 NOTE — Progress Notes (Unsigned)
 [facility] [address] [place], [place] [address]   Office Visit Note  Patient Name: Linda Tran Date of Birth [id number]  Medical Record number [id number]  Date of Service: 04/11/2024  Chief Complaint  Patient presents with   Acute Visit    Patient states her hearing is muffled in her left ear and the lymph node beneath it feels swollen. She also reports having nasal congestion for the last 5 days.      HPI Discussed the use of AI scribe software for clinical note transcription with the patient, who gave verbal consent to proceed.  History of Present Illness Linda Tran is a 63 year old female who presents with sinus congestion and ear pressure.  Sinonasal congestion and pressure - Sinus congestion and pressure since Christmas morning, primarily affecting the left ear and sinuses - Thick mucus sensation at the back of the throat, leading to occasional coughing and gagging sensation - Sinus pressure around eyes and nose - Nasal discharge is variable, sometimes clear - No chest involvement - Symptoms began after exposure to cinnamon oil in homemade wax melts - History of sensitivity to Christmas fragrances causing stuffiness  Otologic symptoms - Pressure in the left ear, becomes painful with palpation near the lymph node area - History of chemical burn in the ear from wasp spray, previously diagnosed as 'strip of the ear'  Pharyngeal symptoms - Sore throat in the mornings due to mucus accumulation, improves as the day progresses  Systemic symptoms - General malaise and sensation of fullness - Feels fatigued - Difficulty sleeping attributed to current symptoms - No fever  Medication use and adverse reactions - Uses Singulair  as needed during allergy flare-ups - Currently using Mucinex DM - Previously tried prescription-strength Sudafed, which caused tachycardia and insomnia - Prefers over-the-counter Sudafed when  necessary  Immunization status - Has not received a flu shot this year due to past adverse reactions  Oncologic history - History of breast cancer, in remission for approximately eleven years     Current Medication:  Outpatient Encounter Medications as of 04/11/2024  Medication Sig Note   Collagen-Vitamin C -Biotin (COLLAGEN PO) Take by mouth daily.    fluticasone  (FLONASE ) 50 MCG/ACT nasal spray Place 2 sprays into both nostrils daily.    melatonin 5 MG TABS Take 5 mg by mouth. 02/16/2024: Melatonin w/Collagen   methocarbamol  (ROBAXIN ) 750 MG tablet TAKE 1 TABLET BY MOUTH UP TO 3 TIMES DAILY AS NEEDED    Multiple Vitamin (MULTIVITAMIN ADULT PO) multivitamin    [DISCONTINUED] estradiol  (ESTRACE ) 0.01 % CREA vaginal cream Place 0.5 g vaginally 2 (two) times a week. Place 0.5g nightly for two weeks then twice a week after (Patient not taking: Reported on 04/11/2024)    [DISCONTINUED] Vibegron  75 MG TABS Take 1 tablet (75 mg total) by mouth daily. (Patient not taking: Reported on 04/11/2024)    No facility-administered encounter medications on file as of 04/11/2024.      Medical History: Past Medical History:  Diagnosis Date   Allergy    Anemia    Breast cancer (HCC) 2013   left breast, DCIS, radiation   Hx of migraine headaches    Personal history of radiation therapy 2013   F/U from lumpectomy for DCIS     Vital Signs: BP 116/80   Pulse (!) 104   Temp 97.7 F (36.5 C)   Ht 5' 2 (1.575 m)   Wt 124 lb (56.2 kg)   LMP 01/06/2014  SpO2 96%   BMI 22.68 kg/m    Review of Systems  Physical Exam Vitals reviewed.  Constitutional:      General: She is not in acute distress.    Appearance: She is not ill-appearing.  HENT:     Head: Normocephalic.     Right Ear: Ear canal and external ear normal.     Left Ear: Ear canal and external ear normal.     Ears:     Comments: TMs dull bilaterally. Left TM slightly injected with small serous fluid, not bulging or  erythematous.    Nose: No mucosal edema, congestion or rhinorrhea.     Right Turbinates: Swollen and pale.     Left Turbinates: Swollen and pale.     Right Sinus: No frontal sinus tenderness.     Left Sinus: No frontal sinus tenderness (Some pressure with palpation over maxillary sinuses).     Mouth/Throat:     Mouth: Mucous membranes are moist. No oral lesions.     Pharynx: No pharyngeal swelling or posterior oropharyngeal erythema.     Tonsils: No tonsillar exudate.  Cardiovascular:     Rate and Rhythm: Regular rhythm. Tachycardia present.     Heart sounds: No murmur heard.    No friction rub. No gallop.  Pulmonary:     Effort: Pulmonary effort is normal.     Breath sounds: Normal breath sounds. No wheezing, rhonchi or rales.  Musculoskeletal:     Cervical back: Neck supple. No rigidity.  Lymphadenopathy:     Cervical: Cervical adenopathy (small anterior nodes, slightly tender on left) present.  Neurological:     Mental Status: She is alert.       Assessment/Plan: Assessment and Plan Assessment & Plan Allergic rhinitis with sinusitis and left Eustachian tube dysfunction Symptoms likely allergy-related, no ear infection but possible fluid in ear, sinus inflammation due to allergies. - Prescribed fluticasone  nasal spray, two sprays each nostril daily. - Recommended OTC Sudafed for sinus pressure. - Advised decongestant nasal spray (Afrin/oxymetazoline) for quick relief, max three days. - Suggested Allegra for additional allergy relief. - Advised Singulair  in the morning to improve sleep. - Recommended ibuprofen  for pain management if needed. - Advised humidifier use to maintain air humidity. - Instructed to monitor symptoms and report worsening pain, pressure, or fever.    General Counseling: norissa bartee understanding of the findings of todays visit and plan of treatment. she has been encouraged to call the office with any questions or concerns that should arise  related to todays visit.    Time spent:*** Minutes    Joen Arts PA-C Physician Assistant

## 2024-04-13 ENCOUNTER — Telehealth: Payer: Self-pay

## 2024-04-13 DIAGNOSIS — J329 Chronic sinusitis, unspecified: Secondary | ICD-10-CM

## 2024-04-13 MED ORDER — PREDNISONE 20 MG PO TABS: 40.0000 mg | ORAL_TABLET | Freq: Every day | ORAL | 0 refills | Status: AC

## 2024-04-13 MED ORDER — AMOXICILLIN-POT CLAVULANATE 875-125 MG PO TABS: 1.0000 | ORAL_TABLET | Freq: Two times a day (BID) | ORAL | 0 refills | Status: AC

## 2024-04-13 NOTE — Telephone Encounter (Signed)
 Returned call to patient. Has been using Navage, Flonase , Afrin and Singulair  last few days since prior visit. No significant relief with these. Noting yellow, blood-tinged nasal d/c. Unable to take Sudafed due to tachycardia it causes. Feels she needs antibiotic.  Notes she felt poorly taking higher dose steroids in past, i.e. Prednisone  60 mg.  Will trial antibiotic. Discussed holding steroid for next few days. If symptoms do not improve significantly with antibiotic alone, she can begin steroid. Will start with Prednisone  40 mg daily but may decrease dose to 20 mg if notes adverse effects. Advised to stop Afrin and Singulair  (patient feels it may be affecting her sleep). Continue Flonase  daily, Navage PRN. Patient expresses understanding of above. Encouraged to follow up as needed with new/worsening/persistent symptoms.

## 2024-04-13 NOTE — Telephone Encounter (Signed)
 Patient called stating she still does not feel well and all OTC medications that she was advised to take at her office visit this week are not working. Patient is requesting an antibiotic.

## 2024-04-13 NOTE — Addendum Note (Signed)
 Addended by: TOWANA JOEN HERO on: 04/13/2024 12:31 PM   Modules accepted: Orders

## 2024-04-15 NOTE — Patient Instructions (Signed)
-  Rest and stay well hydrated (by drinking water and other liquids).  -Use Flonase /Fluticasone  nasal spray, 2 sprays to each nostril once a day. -Continue Singulair  but try switching it to morning to see if this helps improve your sleep. -Add Allegra (or another OTC antihistamine) once daily to help relieve allergy symptoms. -Take over-the-counter medicines (i.e. Sudafed/Phenylephrine, Ibuprofen ) to help relieve your symptoms. -Consider using a neti pot or saline sinus rinse once or twice a day to help clear mucus. -For your sore throat, use cough drops/throat lozenges, gargle warm salt water and/or drink warm liquids (like tea with honey). -Send MyChart message to provider, call or schedule return visit as needed for new/worsening symptoms (i.e. increased sinus/ear pain or pressure, fever) or if symptoms do not improve as discussed with recommended treatment over the next 3-5 days.

## 2024-04-20 NOTE — Progress Notes (Signed)
 1/7 left vm for pt to call to schedule UB with either MD.

## 2024-05-19 ENCOUNTER — Telehealth: Payer: Self-pay | Admitting: Obstetrics

## 2024-05-19 NOTE — Telephone Encounter (Signed)
 Pt needs to verify some information about her upcoming appt for Urethral Bulking. She said that she was told something about valium and a numbing agent prior.

## 2024-05-26 ENCOUNTER — Ambulatory Visit: Admitting: Obstetrics

## 2024-06-30 ENCOUNTER — Ambulatory Visit: Admitting: Obstetrics
